# Patient Record
Sex: Female | Born: 1945 | Race: White | Hispanic: No | Marital: Married | Smoking: Never smoker
Health system: Southern US, Community
[De-identification: ages and names within clinical notes are randomized; demographics above are authoritative.]

## PROBLEM LIST (undated history)

## (undated) DIAGNOSIS — F32A Depression, unspecified: Secondary | ICD-10-CM

## (undated) DIAGNOSIS — F419 Anxiety disorder, unspecified: Secondary | ICD-10-CM

## (undated) DIAGNOSIS — R51 Headache: Secondary | ICD-10-CM

## (undated) DIAGNOSIS — M1990 Unspecified osteoarthritis, unspecified site: Secondary | ICD-10-CM

## (undated) DIAGNOSIS — R0600 Dyspnea, unspecified: Secondary | ICD-10-CM

## (undated) DIAGNOSIS — D649 Anemia, unspecified: Secondary | ICD-10-CM

## (undated) DIAGNOSIS — R519 Headache, unspecified: Secondary | ICD-10-CM

## (undated) DIAGNOSIS — K219 Gastro-esophageal reflux disease without esophagitis: Secondary | ICD-10-CM

## (undated) DIAGNOSIS — F329 Major depressive disorder, single episode, unspecified: Secondary | ICD-10-CM

## (undated) DIAGNOSIS — G4730 Sleep apnea, unspecified: Secondary | ICD-10-CM

## (undated) HISTORY — PX: BUNIONECTOMY: SHX129

## (undated) HISTORY — PX: FINGER SURGERY: SHX640

## (undated) HISTORY — DX: Gastro-esophageal reflux disease without esophagitis: K21.9

## (undated) HISTORY — DX: Headache: R51

## (undated) HISTORY — PX: OOPHORECTOMY: SHX86

## (undated) HISTORY — PX: JOINT REPLACEMENT: SHX530

## (undated) HISTORY — DX: Anemia, unspecified: D64.9

## (undated) HISTORY — DX: Depression, unspecified: F32.A

## (undated) HISTORY — DX: Major depressive disorder, single episode, unspecified: F32.9

## (undated) HISTORY — PX: ABDOMINAL HYSTERECTOMY: SHX81

## (undated) HISTORY — DX: Headache, unspecified: R51.9

## (undated) HISTORY — DX: Anxiety disorder, unspecified: F41.9

---

## 1998-06-06 ENCOUNTER — Ambulatory Visit (HOSPITAL_BASED_OUTPATIENT_CLINIC_OR_DEPARTMENT_OTHER): Admission: RE | Admit: 1998-06-06 | Discharge: 1998-06-06 | Payer: Self-pay | Admitting: Plastic Surgery

## 1999-08-20 ENCOUNTER — Encounter: Payer: Self-pay | Admitting: Internal Medicine

## 1999-08-20 ENCOUNTER — Encounter: Admission: RE | Admit: 1999-08-20 | Discharge: 1999-08-20 | Payer: Self-pay | Admitting: Internal Medicine

## 2000-11-10 ENCOUNTER — Encounter: Admission: RE | Admit: 2000-11-10 | Discharge: 2000-11-10 | Payer: Self-pay | Admitting: Internal Medicine

## 2000-11-10 ENCOUNTER — Encounter: Payer: Self-pay | Admitting: Internal Medicine

## 2000-12-22 ENCOUNTER — Encounter (INDEPENDENT_AMBULATORY_CARE_PROVIDER_SITE_OTHER): Payer: Self-pay | Admitting: Specialist

## 2000-12-22 ENCOUNTER — Ambulatory Visit (HOSPITAL_COMMUNITY): Admission: RE | Admit: 2000-12-22 | Discharge: 2000-12-22 | Payer: Self-pay | Admitting: *Deleted

## 2006-08-07 ENCOUNTER — Encounter: Admission: RE | Admit: 2006-08-07 | Discharge: 2006-08-07 | Payer: Self-pay | Admitting: Internal Medicine

## 2008-07-18 ENCOUNTER — Ambulatory Visit (HOSPITAL_COMMUNITY): Admission: RE | Admit: 2008-07-18 | Discharge: 2008-07-18 | Payer: Self-pay | Admitting: *Deleted

## 2008-07-18 ENCOUNTER — Encounter (INDEPENDENT_AMBULATORY_CARE_PROVIDER_SITE_OTHER): Payer: Self-pay | Admitting: *Deleted

## 2008-07-23 ENCOUNTER — Encounter: Admission: RE | Admit: 2008-07-23 | Discharge: 2008-07-23 | Payer: Self-pay | Admitting: Internal Medicine

## 2010-09-11 NOTE — Op Note (Signed)
Kendra Taylor, Kendra Taylor            ACCOUNT NO.:  [id number]   MEDICAL RECORD NO.:  [id number]          PATIENT TYPE:  AMB   LOCATION:  ENDO                         FACILITY:  [facility]   PHYSICIAN:  Georgiana Spinner, M.D.    DATE OF BIRTH:  19-Jun-1945   DATE OF PROCEDURE:  07/18/2008  DATE OF DISCHARGE:                               OPERATIVE REPORT   PROCEDURE:  Colonoscopy with biopsy.   INDICATIONS:  Colon polyps, colon cancer screening.   ANESTHESIA:  Fentanyl 100 mcg, Versed 10 mg.   PROCEDURE:  With the patient mildly sedated in the left lateral  decubitus position, the Pentax videoscopic pediatric colonoscope was  inserted in the rectum and passed under direct vision with pressure  applied to reach the cecum identified by ileocecal valve and appendiceal  orifice, both of which were photographed.  From this point the  colonoscope was slowly withdrawn taking circumferential views of colonic  mucosa stopping in the sigmoid colon where small polyp was seen and  removed using hot biopsy forceps technique setting 20/150 blended  current.  We next stopped in the rectum which appeared normal on direct  and showed hemorrhoids on retroflexed view.  The endoscope was  straightened and withdrawn.  The patient's vital signs, pulse oximeter  remained stable.  The patient tolerated procedure well without apparent  complication.   FINDINGS:  Small polyp of sigmoid colon.  Internal hemorrhoids,  otherwise unremarkable exam.   PLAN:  Await biopsy report.  The patient will call me for results and  follow-up with me as an outpatient.           ______________________________  Georgiana Spinner, M.D.     GMO/MEDQ  D:  07/18/2008  T:  07/18/2008  Job:  [id number]

## 2010-09-14 NOTE — Procedures (Signed)
[facility]  Patient:    Kendra Taylor, Kendra Taylor Visit Number: [id number] MRN: [id number]          Service Type: END Location: ENDO Attending Physician:  Sabino Gasser Proc. Date: 12/22/00 Adm. Date:  12/22/2000                             Procedure Report  PROCEDURE:  Colonoscopy with polypectomy.  INDICATIONS:  Colon polyp.  ANESTHESIA:  Demerol 50, Versed 5 mg.  DESCRIPTION OF PROCEDURE:  With the patient mildly sedated in the left lateral decubitus position, the Olympus videoscopic colonoscope was inserted in the rectum and passed under direct vision to the cecum, identified by the ileocecal valve and appendiceal orifice, both of which were photographed. From this point, the colonoscope was slowly withdrawn, taking circumferential views of the entire colonic mucosa, stopping only in the rectum which appeared normal on direct view and showed internal hemorrhoids on retroflex view. Above this, at 20 cm, was a polyp which also was photographed and removed using snare cautery technique setting of 3-3 blended current.  Once removed, the endoscope was withdrawn as noted above.  The patients vital signs and pulse oximeter remained stable.  The patient tolerated the procedure well without apparent complications.  FINDINGS: 1. Internal hemorrhoids. 2. Polyp at 20 cm.  PLAN:  Await biopsy report.  The patient will call me for results and follow up with me as an outpatient. Attending Physician:  Sabino Gasser DD:  12/22/00 TD:  12/22/00 Job: [id number]

## 2011-03-23 ENCOUNTER — Encounter: Payer: Self-pay | Admitting: Emergency Medicine

## 2011-03-23 ENCOUNTER — Emergency Department (INDEPENDENT_AMBULATORY_CARE_PROVIDER_SITE_OTHER)
Admission: EM | Admit: 2011-03-23 | Discharge: 2011-03-23 | Disposition: A | Discharge status: Home / Self Care | Payer: Medicare Other | Source: Home / Self Care | Attending: Family Medicine | Admitting: Family Medicine

## 2011-03-23 DIAGNOSIS — H109 Unspecified conjunctivitis: Secondary | ICD-10-CM

## 2011-03-23 HISTORY — DX: Unspecified osteoarthritis, unspecified site: M19.90

## 2011-03-23 HISTORY — DX: Sleep apnea, unspecified: G47.30

## 2011-03-23 MED ORDER — CIPROFLOXACIN HCL 0.3 % OP SOLN: 1.0000 [drp] | OPHTHALMIC | Status: AC

## 2011-03-23 NOTE — ED Notes (Signed)
Right eye pain onset Wednesday, Thursday.  Sore throat wed afternoon.  Denies fever.  Patient has a congested cough.  No ear pain

## 2011-03-23 NOTE — ED Provider Notes (Signed)
History     CSN: [id number] Arrival date & time: 03/23/2011 11:39 AM   First MD Initiated Contact with Patient 03/23/11 1034      Chief Complaint  Patient presents with  . URI    (Consider location/radiation/quality/duration/timing/severity/associated sxs/prior treatment) HPI Comments: Kendra Taylor presents with URI sx of nasal congestion, cough, watery eyes. She also now reports green discharge from her RIGHT eye with redness and "stuck shut" in the mornings. She wears glasses but not contacts.   Patient is a 65 y.o. female presenting with URI and conjunctivitis. The history is provided by the patient.  URI Primary symptoms do not include fever or fatigue. The current episode started more than 1 week ago. This is a new problem. The problem has not changed since onset. Symptoms associated with the illness include congestion.  Conjunctivitis  The current episode started 2 days ago. The problem occurs rarely. The problem has been gradually worsening. The problem is moderate. The symptoms are relieved by nothing. The symptoms are aggravated by nothing. Associated symptoms include eye itching, congestion, URI, eye discharge and eye redness. Pertinent negatives include no fever, no decreased vision, no double vision, no photophobia and no eye pain. There were no sick contacts. She has received no recent medical care.    No past medical history on file.  No past surgical history on file.  No family history on file.  History  Substance Use Topics  . Smoking status: Not on file  . Smokeless tobacco: Not on file  . Alcohol Use: Not on file    OB History    No data available      Review of Systems  Constitutional: Negative for fever and fatigue.  HENT: Positive for congestion.   Eyes: Positive for discharge, redness and itching. Negative for double vision, photophobia and pain.  Respiratory: Negative.   Cardiovascular: Negative.   Gastrointestinal: Negative.   Genitourinary:  Negative.   Musculoskeletal: Negative.   Skin: Negative.   Neurological: Negative.     Allergies  Aspirin  Home Medications   Current Outpatient Rx  Name Route Sig Dispense Refill  . CIPROFLOXACIN HCL 0.3 % OP SOLN Right Eye Place 1 drop into the right eye every 2 (two) hours. Administer 1 drop, every 2 hours, while awake, for 2 days. Then 1 drop, every 4 hours, while awake, for the next 5 days. 10 mL 0    BP 124/80  Pulse 74  Temp(Src) 98.5 F (36.9 C) (Oral)  Resp 18  SpO2 95%  Physical Exam  Constitutional: She is oriented to person, place, and time. She appears well-developed and well-nourished.  HENT:  Head: Normocephalic and atraumatic.  Right Ear: Tympanic membrane and ear canal normal.  Left Ear: Tympanic membrane and ear canal normal.  Mouth/Throat: Oropharynx is clear and moist.  Eyes: EOM are normal. Right eye exhibits discharge. Right conjunctiva is injected. Right conjunctiva has no hemorrhage.  Neck: Normal range of motion.  Pulmonary/Chest: Effort normal and breath sounds normal.  Neurological: She is alert and oriented to person, place, and time.  Skin: Skin is warm and dry.    ED Course  Procedures (including critical care time)  Labs Reviewed - No data to display No results found.   1. Conjunctivitis of right eye       MDM          Richardo Priest, MD 03/23/11 1253

## 2011-06-05 DIAGNOSIS — M159 Polyosteoarthritis, unspecified: Secondary | ICD-10-CM | POA: Diagnosis not present

## 2011-06-05 DIAGNOSIS — M069 Rheumatoid arthritis, unspecified: Secondary | ICD-10-CM | POA: Diagnosis not present

## 2011-06-18 DIAGNOSIS — M069 Rheumatoid arthritis, unspecified: Secondary | ICD-10-CM | POA: Diagnosis not present

## 2011-07-02 DIAGNOSIS — M069 Rheumatoid arthritis, unspecified: Secondary | ICD-10-CM | POA: Diagnosis not present

## 2011-07-30 DIAGNOSIS — M069 Rheumatoid arthritis, unspecified: Secondary | ICD-10-CM | POA: Diagnosis not present

## 2011-08-20 DIAGNOSIS — M159 Polyosteoarthritis, unspecified: Secondary | ICD-10-CM | POA: Diagnosis not present

## 2011-08-20 DIAGNOSIS — M069 Rheumatoid arthritis, unspecified: Secondary | ICD-10-CM | POA: Diagnosis not present

## 2011-08-26 DIAGNOSIS — K219 Gastro-esophageal reflux disease without esophagitis: Secondary | ICD-10-CM | POA: Diagnosis not present

## 2011-08-26 DIAGNOSIS — N393 Stress incontinence (female) (male): Secondary | ICD-10-CM | POA: Diagnosis not present

## 2011-09-24 DIAGNOSIS — R5383 Other fatigue: Secondary | ICD-10-CM | POA: Diagnosis not present

## 2011-09-24 DIAGNOSIS — R32 Unspecified urinary incontinence: Secondary | ICD-10-CM | POA: Diagnosis not present

## 2011-09-24 DIAGNOSIS — M069 Rheumatoid arthritis, unspecified: Secondary | ICD-10-CM | POA: Diagnosis not present

## 2011-09-24 DIAGNOSIS — R5381 Other malaise: Secondary | ICD-10-CM | POA: Diagnosis not present

## 2011-10-23 DIAGNOSIS — M6281 Muscle weakness (generalized): Secondary | ICD-10-CM | POA: Diagnosis not present

## 2011-10-23 DIAGNOSIS — R32 Unspecified urinary incontinence: Secondary | ICD-10-CM | POA: Diagnosis not present

## 2011-10-23 DIAGNOSIS — R279 Unspecified lack of coordination: Secondary | ICD-10-CM | POA: Diagnosis not present

## 2011-10-23 DIAGNOSIS — N393 Stress incontinence (female) (male): Secondary | ICD-10-CM | POA: Diagnosis not present

## 2011-11-01 DIAGNOSIS — N393 Stress incontinence (female) (male): Secondary | ICD-10-CM | POA: Diagnosis not present

## 2011-11-01 DIAGNOSIS — N3944 Nocturnal enuresis: Secondary | ICD-10-CM | POA: Diagnosis not present

## 2011-11-01 DIAGNOSIS — R351 Nocturia: Secondary | ICD-10-CM | POA: Diagnosis not present

## 2011-11-04 DIAGNOSIS — R1084 Generalized abdominal pain: Secondary | ICD-10-CM | POA: Diagnosis not present

## 2011-11-04 DIAGNOSIS — R110 Nausea: Secondary | ICD-10-CM | POA: Diagnosis not present

## 2011-11-05 DIAGNOSIS — R32 Unspecified urinary incontinence: Secondary | ICD-10-CM | POA: Diagnosis not present

## 2011-11-05 DIAGNOSIS — R279 Unspecified lack of coordination: Secondary | ICD-10-CM | POA: Diagnosis not present

## 2011-11-05 DIAGNOSIS — M6281 Muscle weakness (generalized): Secondary | ICD-10-CM | POA: Diagnosis not present

## 2011-11-05 DIAGNOSIS — N393 Stress incontinence (female) (male): Secondary | ICD-10-CM | POA: Diagnosis not present

## 2011-11-06 DIAGNOSIS — K219 Gastro-esophageal reflux disease without esophagitis: Secondary | ICD-10-CM | POA: Diagnosis not present

## 2011-11-06 DIAGNOSIS — K5900 Constipation, unspecified: Secondary | ICD-10-CM | POA: Diagnosis not present

## 2011-11-06 DIAGNOSIS — R110 Nausea: Secondary | ICD-10-CM | POA: Diagnosis not present

## 2011-11-12 DIAGNOSIS — N393 Stress incontinence (female) (male): Secondary | ICD-10-CM | POA: Diagnosis not present

## 2011-11-12 DIAGNOSIS — M6281 Muscle weakness (generalized): Secondary | ICD-10-CM | POA: Diagnosis not present

## 2011-11-12 DIAGNOSIS — R279 Unspecified lack of coordination: Secondary | ICD-10-CM | POA: Diagnosis not present

## 2011-11-12 DIAGNOSIS — R32 Unspecified urinary incontinence: Secondary | ICD-10-CM | POA: Diagnosis not present

## 2011-11-19 DIAGNOSIS — M159 Polyosteoarthritis, unspecified: Secondary | ICD-10-CM | POA: Diagnosis not present

## 2011-11-19 DIAGNOSIS — M069 Rheumatoid arthritis, unspecified: Secondary | ICD-10-CM | POA: Diagnosis not present

## 2011-11-19 DIAGNOSIS — M79609 Pain in unspecified limb: Secondary | ICD-10-CM | POA: Diagnosis not present

## 2011-11-26 DIAGNOSIS — R279 Unspecified lack of coordination: Secondary | ICD-10-CM | POA: Diagnosis not present

## 2011-11-26 DIAGNOSIS — F411 Generalized anxiety disorder: Secondary | ICD-10-CM | POA: Diagnosis not present

## 2011-11-26 DIAGNOSIS — Z7189 Other specified counseling: Secondary | ICD-10-CM | POA: Diagnosis not present

## 2011-11-26 DIAGNOSIS — R110 Nausea: Secondary | ICD-10-CM | POA: Diagnosis not present

## 2011-11-26 DIAGNOSIS — N393 Stress incontinence (female) (male): Secondary | ICD-10-CM | POA: Diagnosis not present

## 2011-11-26 DIAGNOSIS — M6281 Muscle weakness (generalized): Secondary | ICD-10-CM | POA: Diagnosis not present

## 2011-11-26 DIAGNOSIS — G43909 Migraine, unspecified, not intractable, without status migrainosus: Secondary | ICD-10-CM | POA: Diagnosis not present

## 2011-11-27 DIAGNOSIS — M0500 Felty's syndrome, unspecified site: Secondary | ICD-10-CM | POA: Diagnosis not present

## 2011-11-27 DIAGNOSIS — M2040 Other hammer toe(s) (acquired), unspecified foot: Secondary | ICD-10-CM | POA: Diagnosis not present

## 2011-12-02 DIAGNOSIS — K219 Gastro-esophageal reflux disease without esophagitis: Secondary | ICD-10-CM | POA: Diagnosis not present

## 2011-12-26 DIAGNOSIS — M542 Cervicalgia: Secondary | ICD-10-CM | POA: Diagnosis not present

## 2011-12-26 DIAGNOSIS — R51 Headache: Secondary | ICD-10-CM | POA: Diagnosis not present

## 2011-12-26 DIAGNOSIS — M62838 Other muscle spasm: Secondary | ICD-10-CM | POA: Diagnosis not present

## 2011-12-26 DIAGNOSIS — G43019 Migraine without aura, intractable, without status migrainosus: Secondary | ICD-10-CM | POA: Diagnosis not present

## 2011-12-26 DIAGNOSIS — Z79899 Other long term (current) drug therapy: Secondary | ICD-10-CM | POA: Diagnosis not present

## 2012-01-14 DIAGNOSIS — M069 Rheumatoid arthritis, unspecified: Secondary | ICD-10-CM | POA: Diagnosis not present

## 2012-01-15 DIAGNOSIS — R279 Unspecified lack of coordination: Secondary | ICD-10-CM | POA: Diagnosis not present

## 2012-01-15 DIAGNOSIS — N393 Stress incontinence (female) (male): Secondary | ICD-10-CM | POA: Diagnosis not present

## 2012-01-15 DIAGNOSIS — N3944 Nocturnal enuresis: Secondary | ICD-10-CM | POA: Diagnosis not present

## 2012-01-15 DIAGNOSIS — M6281 Muscle weakness (generalized): Secondary | ICD-10-CM | POA: Diagnosis not present

## 2012-01-24 DIAGNOSIS — H1044 Vernal conjunctivitis: Secondary | ICD-10-CM | POA: Diagnosis not present

## 2012-02-05 DIAGNOSIS — M6281 Muscle weakness (generalized): Secondary | ICD-10-CM | POA: Diagnosis not present

## 2012-02-05 DIAGNOSIS — N3944 Nocturnal enuresis: Secondary | ICD-10-CM | POA: Diagnosis not present

## 2012-02-05 DIAGNOSIS — R279 Unspecified lack of coordination: Secondary | ICD-10-CM | POA: Diagnosis not present

## 2012-02-05 DIAGNOSIS — N393 Stress incontinence (female) (male): Secondary | ICD-10-CM | POA: Diagnosis not present

## 2012-02-11 DIAGNOSIS — F411 Generalized anxiety disorder: Secondary | ICD-10-CM | POA: Diagnosis not present

## 2012-02-11 DIAGNOSIS — Z23 Encounter for immunization: Secondary | ICD-10-CM | POA: Diagnosis not present

## 2012-02-11 DIAGNOSIS — R32 Unspecified urinary incontinence: Secondary | ICD-10-CM | POA: Diagnosis not present

## 2012-02-11 DIAGNOSIS — R040 Epistaxis: Secondary | ICD-10-CM | POA: Diagnosis not present

## 2012-02-11 DIAGNOSIS — K219 Gastro-esophageal reflux disease without esophagitis: Secondary | ICD-10-CM | POA: Diagnosis not present

## 2012-02-25 DIAGNOSIS — J342 Deviated nasal septum: Secondary | ICD-10-CM | POA: Diagnosis not present

## 2012-02-25 DIAGNOSIS — K219 Gastro-esophageal reflux disease without esophagitis: Secondary | ICD-10-CM | POA: Diagnosis not present

## 2012-02-25 DIAGNOSIS — R040 Epistaxis: Secondary | ICD-10-CM | POA: Diagnosis not present

## 2012-02-25 DIAGNOSIS — G4733 Obstructive sleep apnea (adult) (pediatric): Secondary | ICD-10-CM | POA: Diagnosis not present

## 2012-03-06 DIAGNOSIS — H40059 Ocular hypertension, unspecified eye: Secondary | ICD-10-CM | POA: Diagnosis not present

## 2012-03-10 DIAGNOSIS — M069 Rheumatoid arthritis, unspecified: Secondary | ICD-10-CM | POA: Diagnosis not present

## 2012-03-16 DIAGNOSIS — G43019 Migraine without aura, intractable, without status migrainosus: Secondary | ICD-10-CM | POA: Diagnosis not present

## 2012-03-23 DIAGNOSIS — M159 Polyosteoarthritis, unspecified: Secondary | ICD-10-CM | POA: Diagnosis not present

## 2012-03-23 DIAGNOSIS — M069 Rheumatoid arthritis, unspecified: Secondary | ICD-10-CM | POA: Diagnosis not present

## 2012-03-24 DIAGNOSIS — K219 Gastro-esophageal reflux disease without esophagitis: Secondary | ICD-10-CM | POA: Diagnosis not present

## 2012-03-24 DIAGNOSIS — M949 Disorder of cartilage, unspecified: Secondary | ICD-10-CM | POA: Diagnosis not present

## 2012-03-24 DIAGNOSIS — Z79899 Other long term (current) drug therapy: Secondary | ICD-10-CM | POA: Diagnosis not present

## 2012-03-24 DIAGNOSIS — M899 Disorder of bone, unspecified: Secondary | ICD-10-CM | POA: Diagnosis not present

## 2012-03-31 DIAGNOSIS — M899 Disorder of bone, unspecified: Secondary | ICD-10-CM | POA: Diagnosis not present

## 2012-03-31 DIAGNOSIS — M069 Rheumatoid arthritis, unspecified: Secondary | ICD-10-CM | POA: Diagnosis not present

## 2012-03-31 DIAGNOSIS — N393 Stress incontinence (female) (male): Secondary | ICD-10-CM | POA: Diagnosis not present

## 2012-03-31 DIAGNOSIS — Z1212 Encounter for screening for malignant neoplasm of rectum: Secondary | ICD-10-CM | POA: Diagnosis not present

## 2012-03-31 DIAGNOSIS — M949 Disorder of cartilage, unspecified: Secondary | ICD-10-CM | POA: Diagnosis not present

## 2012-03-31 DIAGNOSIS — Z79899 Other long term (current) drug therapy: Secondary | ICD-10-CM | POA: Diagnosis not present

## 2012-03-31 DIAGNOSIS — K219 Gastro-esophageal reflux disease without esophagitis: Secondary | ICD-10-CM | POA: Diagnosis not present

## 2012-05-05 DIAGNOSIS — M069 Rheumatoid arthritis, unspecified: Secondary | ICD-10-CM | POA: Diagnosis not present

## 2012-06-16 DIAGNOSIS — L219 Seborrheic dermatitis, unspecified: Secondary | ICD-10-CM | POA: Diagnosis not present

## 2012-06-16 DIAGNOSIS — D235 Other benign neoplasm of skin of trunk: Secondary | ICD-10-CM | POA: Diagnosis not present

## 2012-06-16 DIAGNOSIS — L821 Other seborrheic keratosis: Secondary | ICD-10-CM | POA: Diagnosis not present

## 2012-06-24 DIAGNOSIS — L03039 Cellulitis of unspecified toe: Secondary | ICD-10-CM | POA: Diagnosis not present

## 2012-06-29 DIAGNOSIS — M65839 Other synovitis and tenosynovitis, unspecified forearm: Secondary | ICD-10-CM | POA: Diagnosis not present

## 2012-06-29 DIAGNOSIS — M19039 Primary osteoarthritis, unspecified wrist: Secondary | ICD-10-CM | POA: Diagnosis not present

## 2012-06-29 DIAGNOSIS — M25539 Pain in unspecified wrist: Secondary | ICD-10-CM | POA: Diagnosis not present

## 2012-06-29 DIAGNOSIS — M19049 Primary osteoarthritis, unspecified hand: Secondary | ICD-10-CM | POA: Diagnosis not present

## 2012-06-29 DIAGNOSIS — M79609 Pain in unspecified limb: Secondary | ICD-10-CM | POA: Diagnosis not present

## 2012-06-30 DIAGNOSIS — M069 Rheumatoid arthritis, unspecified: Secondary | ICD-10-CM | POA: Diagnosis not present

## 2012-07-07 ENCOUNTER — Other Ambulatory Visit: Payer: Self-pay | Admitting: Orthopedic Surgery

## 2012-07-15 ENCOUNTER — Encounter (HOSPITAL_COMMUNITY): Payer: Self-pay

## 2012-07-21 DIAGNOSIS — M779 Enthesopathy, unspecified: Secondary | ICD-10-CM | POA: Diagnosis not present

## 2012-07-21 DIAGNOSIS — M2040 Other hammer toe(s) (acquired), unspecified foot: Secondary | ICD-10-CM | POA: Diagnosis not present

## 2012-07-21 DIAGNOSIS — M0500 Felty's syndrome, unspecified site: Secondary | ICD-10-CM | POA: Diagnosis not present

## 2012-07-21 DIAGNOSIS — M069 Rheumatoid arthritis, unspecified: Secondary | ICD-10-CM | POA: Diagnosis not present

## 2012-07-21 DIAGNOSIS — R110 Nausea: Secondary | ICD-10-CM | POA: Diagnosis not present

## 2012-07-21 DIAGNOSIS — M159 Polyosteoarthritis, unspecified: Secondary | ICD-10-CM | POA: Diagnosis not present

## 2012-07-21 NOTE — Pre-Procedure Instructions (Signed)
Kendra Taylor  07/21/2012   Your procedure is scheduled on:  Thursday, April 3rd  Report to [facility] at 1200 PM.  Call this number if you have problems the morning of surgery: [phone]   Remember:   Do not eat food or drink liquids after midnight.    Take these medicines the morning of surgery with A SIP OF WATER: Protonix, zofran if needed, restasis eye drops   Do not wear jewelry, make-up or nail polish.  Do not wear lotions, powders, or perfumes,deodorant.  Do not shave 48 hours prior to surgery.   Do not bring valuables to the hospital.  Contacts, dentures or bridgework may not be worn into surgery.  Leave suitcase in the car. After surgery it may be brought to your room.  For patients admitted to the hospital, checkout time is 11:00 AM the day of discharge.   Patients discharged the day of surgery will not be allowed to drive home.  Special Instructions: Shower using CHG 2 nights before surgery and the night before surgery.  If you shower the day of surgery use CHG.  Use special wash - you have one bottle of CHG for all showers.  You should use approximately 1/3 of the bottle for each shower.   Please read over the following fact sheets that you were given: Pain Booklet, Coughing and Deep Breathing, MRSA Information and Surgical Site Infection Prevention

## 2012-07-22 ENCOUNTER — Encounter (HOSPITAL_COMMUNITY): Payer: Self-pay

## 2012-07-22 ENCOUNTER — Encounter (HOSPITAL_COMMUNITY)
Admission: RE | Admit: 2012-07-22 | Discharge: 2012-07-22 | Disposition: A | Discharge status: Home / Self Care | Payer: MEDICARE | Source: Ambulatory Visit | Attending: Orthopedic Surgery | Admitting: Orthopedic Surgery

## 2012-07-22 DIAGNOSIS — M069 Rheumatoid arthritis, unspecified: Secondary | ICD-10-CM | POA: Diagnosis not present

## 2012-07-22 DIAGNOSIS — Z01812 Encounter for preprocedural laboratory examination: Secondary | ICD-10-CM | POA: Diagnosis not present

## 2012-07-22 LAB — CBC
HCT: 42.4 % (ref 36.0–46.0)
Hemoglobin: 14 g/dL (ref 12.0–15.0)
MCH: 27.6 pg (ref 26.0–34.0)
MCHC: 33 g/dL (ref 30.0–36.0)
MCV: 83.6 fL (ref 78.0–100.0)
Platelets: 218 10*3/uL (ref 150–400)
RBC: 5.07 MIL/uL (ref 3.87–5.11)
RDW: 13.9 % (ref 11.5–15.5)
WBC: 6.4 10*3/uL (ref 4.0–10.5)

## 2012-07-22 LAB — BASIC METABOLIC PANEL
BUN: 23 mg/dL (ref 6–23)
CO2: 32 mEq/L (ref 19–32)
Calcium: 9.8 mg/dL (ref 8.4–10.5)
Chloride: 103 mEq/L (ref 96–112)
Creatinine, Ser: 0.81 mg/dL (ref 0.50–1.10)
GFR calc Af Amer: 84 mL/min — ABNORMAL LOW (ref >90)
GFR calc non Af Amer: 72 mL/min — ABNORMAL LOW (ref >90)
Glucose, Bld: 72 mg/dL (ref 70–99)
Potassium: 4.5 mEq/L (ref 3.5–5.1)
Sodium: 142 mEq/L (ref 135–145)

## 2012-07-22 LAB — SURGICAL PCR SCREEN
MRSA, PCR: NEGATIVE
Staphylococcus aureus: NEGATIVE

## 2012-07-27 DIAGNOSIS — M19049 Primary osteoarthritis, unspecified hand: Secondary | ICD-10-CM | POA: Diagnosis not present

## 2012-07-29 MED ORDER — CEFAZOLIN SODIUM-DEXTROSE 2-3 GM-% IV SOLR
2.0000 g | INTRAVENOUS | Status: AC
  Administered 2012-07-30: 2 g via INTRAVENOUS
  Filled 2012-07-29: qty 50

## 2012-07-30 ENCOUNTER — Observation Stay (HOSPITAL_COMMUNITY)
Admission: RE | Admit: 2012-07-30 | Discharge: 2012-08-01 | Disposition: A | Discharge status: Home / Self Care | Payer: MEDICARE | Source: Ambulatory Visit | Attending: Orthopedic Surgery | Admitting: Orthopedic Surgery

## 2012-07-30 ENCOUNTER — Encounter (HOSPITAL_COMMUNITY): Payer: Self-pay | Admitting: Anesthesiology

## 2012-07-30 ENCOUNTER — Inpatient Hospital Stay (HOSPITAL_COMMUNITY): Payer: MEDICARE | Admitting: Anesthesiology

## 2012-07-30 ENCOUNTER — Encounter (HOSPITAL_COMMUNITY)
Admission: RE | Disposition: A | Discharge status: Home / Self Care | Payer: Self-pay | Source: Ambulatory Visit | Attending: Orthopedic Surgery

## 2012-07-30 DIAGNOSIS — M069 Rheumatoid arthritis, unspecified: Principal | ICD-10-CM | POA: Insufficient documentation

## 2012-07-30 DIAGNOSIS — G8918 Other acute postprocedural pain: Secondary | ICD-10-CM | POA: Diagnosis not present

## 2012-07-30 DIAGNOSIS — R229 Localized swelling, mass and lump, unspecified: Secondary | ICD-10-CM | POA: Diagnosis not present

## 2012-07-30 DIAGNOSIS — Z01812 Encounter for preprocedural laboratory examination: Secondary | ICD-10-CM | POA: Insufficient documentation

## 2012-07-30 DIAGNOSIS — M129 Arthropathy, unspecified: Secondary | ICD-10-CM | POA: Diagnosis not present

## 2012-07-30 DIAGNOSIS — M05741 Rheumatoid arthritis with rheumatoid factor of right hand without organ or systems involvement: Secondary | ICD-10-CM

## 2012-07-30 DIAGNOSIS — M12549 Traumatic arthropathy, unspecified hand: Secondary | ICD-10-CM | POA: Diagnosis not present

## 2012-07-30 HISTORY — PX: FINGER ARTHROPLASTY: SHX5017

## 2012-07-30 SURGERY — ARTHROPLASTY, FINGER
Anesthesia: General | Site: Hand | Laterality: Right | Wound class: Clean

## 2012-07-30 MED ORDER — PROMETHAZINE HCL 25 MG/ML IJ SOLN
6.2500 mg | INTRAMUSCULAR | Status: DC | PRN
  Administered 2012-07-30: 6.25 mg via INTRAVENOUS

## 2012-07-30 MED ORDER — VITAMIN B-12 1000 MCG PO TABS
1000.0000 ug | ORAL_TABLET | Freq: Every day | ORAL | Status: DC
  Administered 2012-07-30 – 2012-08-01 (×3): 1000 ug via ORAL
  Filled 2012-07-30 (×3): qty 1

## 2012-07-30 MED ORDER — ZOLPIDEM TARTRATE 5 MG PO TABS: 10.0000 mg | ORAL_TABLET | Freq: Every evening | ORAL | Status: DC | PRN

## 2012-07-30 MED ORDER — SODIUM CHLORIDE 0.9 % IR SOLN
Status: DC | PRN
  Administered 2012-07-30: 15:00:00

## 2012-07-30 MED ORDER — PHENYLEPHRINE HCL 10 MG/ML IJ SOLN
10.0000 mg | INTRAVENOUS | Status: DC | PRN
  Administered 2012-07-30: 25 ug/min via INTRAVENOUS

## 2012-07-30 MED ORDER — VITAMIN D3 25 MCG (1000 UNIT) PO TABS
1000.0000 [IU] | ORAL_TABLET | Freq: Every day | ORAL | Status: DC
  Administered 2012-07-30 – 2012-08-01 (×3): 1000 [IU] via ORAL
  Filled 2012-07-30 (×3): qty 1

## 2012-07-30 MED ORDER — ONDANSETRON HCL 4 MG/2ML IJ SOLN
4.0000 mg | Freq: Four times a day (QID) | INTRAMUSCULAR | Status: DC | PRN
  Administered 2012-07-31: 4 mg via INTRAVENOUS
  Filled 2012-07-30: qty 2

## 2012-07-30 MED ORDER — LIDOCAINE HCL (CARDIAC) 20 MG/ML IV SOLN
INTRAVENOUS | Status: DC | PRN
  Administered 2012-07-30: 20 mg via INTRAVENOUS

## 2012-07-30 MED ORDER — CYCLOSPORINE 0.05 % OP EMUL
1.0000 [drp] | Freq: Two times a day (BID) | OPHTHALMIC | Status: DC
  Administered 2012-07-30 – 2012-08-01 (×3): 1 [drp] via OPHTHALMIC
  Filled 2012-07-30 (×5): qty 1

## 2012-07-30 MED ORDER — PANTOPRAZOLE SODIUM 40 MG PO TBEC
40.0000 mg | DELAYED_RELEASE_TABLET | ORAL | Status: DC
  Administered 2012-08-01: 40 mg via ORAL
  Filled 2012-07-30: qty 1

## 2012-07-30 MED ORDER — PROMETHAZINE HCL 25 MG RE SUPP: 12.5000 mg | Freq: Four times a day (QID) | RECTAL | Status: DC | PRN

## 2012-07-30 MED ORDER — DOCUSATE SODIUM 100 MG PO CAPS
100.0000 mg | ORAL_CAPSULE | Freq: Two times a day (BID) | ORAL | Status: DC
  Administered 2012-07-30 – 2012-08-01 (×4): 100 mg via ORAL
  Filled 2012-07-30 (×5): qty 1

## 2012-07-30 MED ORDER — ARTIFICIAL TEARS OP OINT
TOPICAL_OINTMENT | OPHTHALMIC | Status: DC | PRN
  Administered 2012-07-30: 1 via OPHTHALMIC

## 2012-07-30 MED ORDER — MORPHINE SULFATE 2 MG/ML IJ SOLN
1.0000 mg | INTRAMUSCULAR | Status: DC | PRN
  Administered 2012-07-31 (×2): 1 mg via INTRAVENOUS
  Filled 2012-07-30 (×2): qty 1

## 2012-07-30 MED ORDER — OXYCODONE HCL 5 MG/5ML PO SOLN: 5.0000 mg | Freq: Once | ORAL | Status: DC | PRN

## 2012-07-30 MED ORDER — CHLORHEXIDINE GLUCONATE 4 % EX LIQD: 60.0000 mL | Freq: Once | CUTANEOUS | Status: DC

## 2012-07-30 MED ORDER — ZOLPIDEM TARTRATE 5 MG PO TABS: 5.0000 mg | ORAL_TABLET | Freq: Every evening | ORAL | Status: DC | PRN

## 2012-07-30 MED ORDER — PROMETHAZINE HCL 25 MG/ML IJ SOLN
INTRAMUSCULAR | Status: AC
  Filled 2012-07-30: qty 1

## 2012-07-30 MED ORDER — ONDANSETRON HCL 4 MG/2ML IJ SOLN
INTRAMUSCULAR | Status: DC | PRN
  Administered 2012-07-30: 4 mg via INTRAVENOUS

## 2012-07-30 MED ORDER — MEPERIDINE HCL 25 MG/ML IJ SOLN: 6.2500 mg | INTRAMUSCULAR | Status: DC | PRN

## 2012-07-30 MED ORDER — BUPIVACAINE-EPINEPHRINE PF 0.5-1:200000 % IJ SOLN
INTRAMUSCULAR | Status: DC | PRN
  Administered 2012-07-30: 30 mL

## 2012-07-30 MED ORDER — 0.9 % SODIUM CHLORIDE (POUR BTL) OPTIME
TOPICAL | Status: DC | PRN
  Administered 2012-07-30: 1000 mL

## 2012-07-30 MED ORDER — LACTATED RINGERS IV SOLN
INTRAVENOUS | Status: DC
  Administered 2012-07-30 – 2012-07-31 (×2): via INTRAVENOUS

## 2012-07-30 MED ORDER — ONDANSETRON HCL 4 MG PO TABS: 4.0000 mg | ORAL_TABLET | Freq: Four times a day (QID) | ORAL | Status: DC | PRN

## 2012-07-30 MED ORDER — VITAMIN C 500 MG PO TABS
1000.0000 mg | ORAL_TABLET | Freq: Every day | ORAL | Status: DC
  Administered 2012-07-30 – 2012-08-01 (×3): 1000 mg via ORAL
  Filled 2012-07-30 (×3): qty 2

## 2012-07-30 MED ORDER — ALPRAZOLAM 0.5 MG PO TABS
0.5000 mg | ORAL_TABLET | Freq: Four times a day (QID) | ORAL | Status: DC | PRN
  Administered 2012-07-31 – 2012-08-01 (×2): 0.5 mg via ORAL
  Filled 2012-07-30 (×2): qty 1

## 2012-07-30 MED ORDER — LACTATED RINGERS IV SOLN
INTRAVENOUS | Status: DC | PRN
  Administered 2012-07-30 (×2): via INTRAVENOUS

## 2012-07-30 MED ORDER — FOLIC ACID 1 MG PO TABS
1.0000 mg | ORAL_TABLET | Freq: Every day | ORAL | Status: DC
  Administered 2012-07-30 – 2012-08-01 (×3): 1 mg via ORAL
  Filled 2012-07-30 (×3): qty 1

## 2012-07-30 MED ORDER — CEFAZOLIN SODIUM 1-5 GM-% IV SOLN
1.0000 g | Freq: Three times a day (TID) | INTRAVENOUS | Status: DC
  Administered 2012-07-30 – 2012-08-01 (×5): 1 g via INTRAVENOUS
  Filled 2012-07-30 (×7): qty 50

## 2012-07-30 MED ORDER — OXYCODONE HCL 5 MG PO TABS: 5.0000 mg | ORAL_TABLET | Freq: Once | ORAL | Status: DC | PRN

## 2012-07-30 MED ORDER — OXYCODONE HCL 5 MG PO TABS
5.0000 mg | ORAL_TABLET | ORAL | Status: DC | PRN
  Administered 2012-07-31 – 2012-08-01 (×7): 10 mg via ORAL
  Filled 2012-07-30 (×7): qty 2

## 2012-07-30 MED ORDER — FENTANYL CITRATE 0.05 MG/ML IJ SOLN
INTRAMUSCULAR | Status: DC | PRN
  Administered 2012-07-30 (×4): 25 ug via INTRAVENOUS

## 2012-07-30 MED ORDER — MIDAZOLAM HCL 2 MG/2ML IJ SOLN: 0.5000 mg | Freq: Once | INTRAMUSCULAR | Status: DC | PRN

## 2012-07-30 MED ORDER — HYDROMORPHONE HCL PF 1 MG/ML IJ SOLN: 0.2500 mg | INTRAMUSCULAR | Status: DC | PRN

## 2012-07-30 MED ORDER — MIDAZOLAM HCL 5 MG/5ML IJ SOLN
INTRAMUSCULAR | Status: DC | PRN
  Administered 2012-07-30: 2 mg via INTRAVENOUS

## 2012-07-30 MED ORDER — ACETAMINOPHEN 10 MG/ML IV SOLN
INTRAVENOUS | Status: AC
  Filled 2012-07-30: qty 100

## 2012-07-30 MED ORDER — SUMATRIPTAN SUCCINATE 100 MG PO TABS
100.0000 mg | ORAL_TABLET | ORAL | Status: DC | PRN
Start: 2012-07-30 — End: 2012-08-01
  Filled 2012-07-30: qty 1

## 2012-07-30 MED ORDER — ONDANSETRON 4 MG PO TBDP
4.0000 mg | ORAL_TABLET | Freq: Three times a day (TID) | ORAL | Status: DC | PRN
  Filled 2012-07-30: qty 1

## 2012-07-30 MED ORDER — LACTATED RINGERS IV SOLN
INTRAVENOUS | Status: DC
  Administered 2012-07-30: 22:00:00 via INTRAVENOUS

## 2012-07-30 MED ORDER — SODIUM CHLORIDE 0.45 % IV SOLN: INTRAVENOUS | Status: DC

## 2012-07-30 MED ORDER — PROPOFOL 10 MG/ML IV BOLUS
INTRAVENOUS | Status: DC | PRN
  Administered 2012-07-30: 200 mg via INTRAVENOUS

## 2012-07-30 SURGICAL SUPPLY — 59 items
BANDAGE CONFORM 3  STR LF (GAUZE/BANDAGES/DRESSINGS) ×2 IMPLANT
BANDAGE ELASTIC 3 VELCRO ST LF (GAUZE/BANDAGES/DRESSINGS) ×1 IMPLANT
BANDAGE ELASTIC 4 VELCRO ST LF (GAUZE/BANDAGES/DRESSINGS) ×1 IMPLANT
BANDAGE GAUZE ELAST BULKY 4 IN (GAUZE/BANDAGES/DRESSINGS) ×1 IMPLANT
BLADE SAW SGTL MED 73X18.5 STR (BLADE) ×1 IMPLANT
BNDG COHESIVE 1X5 TAN STRL LF (GAUZE/BANDAGES/DRESSINGS) IMPLANT
BUR MATCHSTICK NEURO 3.0 LAGG (BURR) ×1 IMPLANT
CLOTH BEACON ORANGE TIMEOUT ST (SAFETY) ×2 IMPLANT
CORDS BIPOLAR (ELECTRODE) ×2 IMPLANT
COVER SURGICAL LIGHT HANDLE (MISCELLANEOUS) ×2 IMPLANT
CUFF TOURNIQUET SINGLE 18IN (TOURNIQUET CUFF) ×2 IMPLANT
CUFF TOURNIQUET SINGLE 24IN (TOURNIQUET CUFF) IMPLANT
DRAPE OEC MINIVIEW 54X84 (DRAPES) ×1 IMPLANT
DRAPE SURG 17X23 STRL (DRAPES) ×2 IMPLANT
DRSG ADAPTIC 3X8 NADH LF (GAUZE/BANDAGES/DRESSINGS) ×1 IMPLANT
GAUZE SPONGE 2X2 8PLY STRL LF (GAUZE/BANDAGES/DRESSINGS) IMPLANT
GAUZE XEROFORM 1X8 LF (GAUZE/BANDAGES/DRESSINGS) IMPLANT
GAUZE XEROFORM 5X9 LF (GAUZE/BANDAGES/DRESSINGS) ×1 IMPLANT
GLOVE BIOGEL M STRL SZ7.5 (GLOVE) ×1 IMPLANT
GLOVE SS BIOGEL STRL SZ 8 (GLOVE) ×1 IMPLANT
GLOVE SUPERSENSE BIOGEL SZ 8 (GLOVE) ×1
GOWN PREVENTION PLUS XLARGE (GOWN DISPOSABLE) ×2 IMPLANT
GOWN STRL NON-REIN LRG LVL3 (GOWN DISPOSABLE) ×4 IMPLANT
GOWN STRL REIN XL XLG (GOWN DISPOSABLE) ×3 IMPLANT
IMPLANT FINGER (Trauma) ×1 IMPLANT
IMPLANT FINGER JONT SZ20 30DEG (Orthopedic Implant) ×1 IMPLANT
KIT BASIN OR (CUSTOM PROCEDURE TRAY) ×2 IMPLANT
KIT ROOM TURNOVER OR (KITS) ×2 IMPLANT
MANIFOLD NEPTUNE II (INSTRUMENTS) ×1 IMPLANT
NDL HYPO 25GX1X1/2 BEV (NEEDLE) IMPLANT
NEEDLE HYPO 25GX1X1/2 BEV (NEEDLE) IMPLANT
NEUFLEX MCP FINGER IMPLANT SIZE 10 CEMENTLESS ×2 IMPLANT
NEUFLEX MCP FINGER IMPLANT SIZE 20 CEMENTLESS ×1 IMPLANT
NS IRRIG 1000ML POUR BTL (IV SOLUTION) ×2 IMPLANT
PACK ORTHO EXTREMITY (CUSTOM PROCEDURE TRAY) ×2 IMPLANT
PAD ARMBOARD 7.5X6 YLW CONV (MISCELLANEOUS) ×4 IMPLANT
PAD CAST 4YDX4 CTTN HI CHSV (CAST SUPPLIES) IMPLANT
PADDING CAST COTTON 4X4 STRL (CAST SUPPLIES) ×4
PASSER SUT SWANSON 36MM LOOP (INSTRUMENTS) ×1 IMPLANT
RETRIEVER SUT HEWSON (MISCELLANEOUS) ×1 IMPLANT
SOLUTION BETADINE 4OZ (MISCELLANEOUS) ×2 IMPLANT
SPONGE GAUZE 2X2 STER 10/PKG (GAUZE/BANDAGES/DRESSINGS)
SPONGE GAUZE 4X4 12PLY (GAUZE/BANDAGES/DRESSINGS) ×2 IMPLANT
SPONGE SCRUB IODOPHOR (GAUZE/BANDAGES/DRESSINGS) ×2 IMPLANT
SUCTION FRAZIER TIP 10 FR DISP (SUCTIONS) ×2 IMPLANT
SUT FIBERWIRE 4-0 18 DIAM BLUE (SUTURE) ×4
SUT FIBERWIRE 4-0 18 TAPR NDL (SUTURE) ×8
SUT MERSILENE 4 0 P 3 (SUTURE) IMPLANT
SUT PROLENE 4 0 PS 2 18 (SUTURE) IMPLANT
SUT PROLENE 5 0 PS 2 (SUTURE) ×3 IMPLANT
SUTURE FIBERWR 4-0 18 DIA BLUE (SUTURE) IMPLANT
SUTURE FIBERWR 4-0 18 TAPR NDL (SUTURE) IMPLANT
SYR CONTROL 10ML LL (SYRINGE) IMPLANT
TOWEL OR 17X24 6PK STRL BLUE (TOWEL DISPOSABLE) ×2 IMPLANT
TOWEL OR 17X26 10 PK STRL BLUE (TOWEL DISPOSABLE) ×2 IMPLANT
TUBE CONNECTING 12X1/4 (SUCTIONS) ×1 IMPLANT
TUBE CONNECTING 20X1/4 (TUBING) ×1 IMPLANT
UNDERPAD 30X30 INCONTINENT (UNDERPADS AND DIAPERS) ×2 IMPLANT
WATER STERILE IRR 1000ML POUR (IV SOLUTION) ×1 IMPLANT

## 2012-07-30 NOTE — Preoperative (Signed)
Beta Blockers   Reason not to administer Beta Blockers:Not Applicable 

## 2012-07-30 NOTE — Anesthesia Preprocedure Evaluation (Addendum)
Anesthesia Evaluation  Patient identified by MRN, date of birth, ID band Patient awake    Reviewed: Allergy & Precautions, H&P , NPO status , Patient's Chart, lab work & pertinent test results  History of Anesthesia Complications Negative for: history of anesthetic complications  Airway Mallampati: II      Dental  (+) Dental Advisory Given and Teeth Intact   Pulmonary sleep apnea and Continuous Positive Airway Pressure Ventilation ,  breath sounds clear to auscultation  Pulmonary exam normal       Cardiovascular negative cardio ROS  Rhythm:Regular Rate:Normal     Neuro/Psych  Headaches,    GI/Hepatic Neg liver ROS, GERD-  Medicated and Controlled,  Endo/Other  negative endocrine ROS  Renal/GU negative Renal ROS     Musculoskeletal  (+) Arthritis -, Rheumatoid disorders,    Abdominal   Peds  Hematology negative hematology ROS (+)   Anesthesia Other Findings   Reproductive/Obstetrics                          Anesthesia Physical Anesthesia Plan  ASA: III  Anesthesia Plan: General   Post-op Pain Management:    Induction: Intravenous  Airway Management Planned: LMA  Additional Equipment:   Intra-op Plan:   Post-operative Plan:   Informed Consent: I have reviewed the patients History and Physical, chart, labs and discussed the procedure including the risks, benefits and alternatives for the proposed anesthesia with the patient or authorized representative who has indicated his/her understanding and acceptance.   Dental advisory given  Plan Discussed with: Surgeon and CRNA  Anesthesia Plan Comments: (Plan routine monitors, GA- LMA OK,  Supraclavicular block for post op analgesia by Dr. Michelle Piper)        Anesthesia Quick Evaluation

## 2012-07-30 NOTE — Anesthesia Postprocedure Evaluation (Signed)
Anesthesia Post Note  Patient: Kendra Taylor  Procedure(s) Performed: Procedure(s) (LRB): RIGHT INDEX FINGER, MIDDLE FINGER, RING FINGER, SMALL FINGER MCP ARTHROPLASTY WITH REALIGNMENT/REPAIR OF EXTENSOR TENDON (Right)  Anesthesia type: General  Patient location: PACU  Post pain: Pain level controlled and Adequate analgesia  Post assessment: Post-op Vital signs reviewed, Patient's Cardiovascular Status Stable, Respiratory Function Stable, Patent Airway and Pain level controlled  Last Vitals:  Filed Vitals:   07/30/12 1856  BP:   Pulse: 63  Temp:   Resp: 13    Post vital signs: Reviewed and stable  Level of consciousness: awake, alert  and oriented  Complications: No apparent anesthesia complications

## 2012-07-30 NOTE — Transfer of Care (Signed)
Immediate Anesthesia Transfer of Care Note  Patient: Kendra Taylor  Procedure(s) Performed: Procedure(s): RIGHT INDEX FINGER, MIDDLE FINGER, RING FINGER, SMALL FINGER MCP ARTHROPLASTY WITH REALIGNMENT/REPAIR OF EXTENSOR TENDON (Right)  Patient Location: PACU  Anesthesia Type:General  Level of Consciousness: awake, alert  and oriented  Airway & Oxygen Therapy: Patient connected to face mask oxygen  Post-op Assessment: Report given to PACU RN  Post vital signs: stable  Complications: No apparent anesthesia complications

## 2012-07-30 NOTE — Anesthesia Procedure Notes (Addendum)
Anesthesia Regional Block:  Supraclavicular block  Pre-Anesthetic Checklist: ,, timeout performed, Correct Patient, Correct Site, Correct Laterality, Correct Procedure, Correct Position, site marked, Risks and benefits discussed,  Surgical consent,  Pre-op evaluation,  At surgeon's request and post-op pain management  Laterality: Right  Prep: chloraprep       Needles:   Needle Type: Echogenic Stimulator Needle     Needle Length: 5cm 5 cm     Additional Needles:  Procedures: ultrasound guided (picture in chart) and nerve stimulator Supraclavicular block  Nerve Stimulator or Paresthesia:  Response: 0.4 mA,   Additional Responses:   Narrative:  Start time: 07/30/2012 2:30 PM End time: 07/30/2012 2:45 PM Injection made incrementally with aspirations every 5 mL.  Performed by: Personally  Anesthesiologist: Arta Bruce MD  Additional Notes: Monitors applied. Patient sedated. Sterile prep and drape,hand hygiene and sterile gloves were used. Relevant anatomy identified.Needle position confirmed.Local anesthetic injected incrementally after negative aspiration. Local anesthetic spread visualized around nerve(s). Vascular puncture avoided. No complications. Image printed for medical record.The patient tolerated the procedure well.       Supraclavicular block Procedure Name: LMA Insertion Date/Time: 07/30/2012 3:00 PM Performed by: Elizbeth Squires R Pre-anesthesia Checklist: Patient identified, Emergency Drugs available, Suction available and Patient being monitored Patient Re-evaluated:Patient Re-evaluated prior to inductionOxygen Delivery Method: Circle system utilized Preoxygenation: Pre-oxygenation with 100% oxygen Intubation Type: IV induction LMA: LMA inserted LMA Size: 4.0 Number of attempts: 1 Placement Confirmation: positive ETCO2 and breath sounds checked- equal and bilateral Tube secured with: Tape Dental Injury: Teeth and Oropharynx as per pre-operative assessment

## 2012-07-30 NOTE — H&P (Signed)
Kendra Taylor is an 67 y.o. female.   Chief Complaint: Rheumatoid arthritis with right hand deformity  HPI: Patient presents for right hand rheumatoid reconstruction and MCP Aplasty with realignment as necessary .Marland KitchenPatient presents for evaluation and treatment of the of their upper extremity predicament. The patient denies neck back chest or of abdominal pain. The patient notes that they have no lower extremity problems. The patient from primarily complains of the upper extremity pain noted. Past Medical History  Diagnosis Date  . Arthritis   . Migraine   . Sleep apnea     cpap    Past Surgical History  Procedure Laterality Date  . Abdominal hysterectomy    . Oophorectomy    . Bunionectomy    . Finger surgery      Family History  Problem Relation Age of Onset  . Dementia Mother   . Sleep apnea Mother   . Hyperthyroidism Mother   . Dementia Father   . Sleep apnea Father    Social History:  reports that she has never smoked. She does not have any smokeless tobacco history on file. She reports that she does not drink alcohol or use illicit drugs.  Allergies:  Allergies  Allergen Reactions  . Aspirin     Ringing in ears with small doses, large doses causes her to lose hearing    Medications Prior to Admission  Medication Sig Dispense Refill  . CALCIUM CARBONATE PO Take 1,000 mg by mouth daily.      . celecoxib (CELEBREX) 200 MG capsule Take 200 mg by mouth daily.        . cholecalciferol (VITAMIN D) 1000 UNITS tablet Take 1,000 Units by mouth daily.      . cycloSPORINE (RESTASIS) 0.05 % ophthalmic emulsion Place 1 drop into both eyes 2 (two) times daily.      . folic acid (FOLVITE) 1 MG tablet Take 1 mg by mouth daily.        Marland Kitchen inFLIXimab (REMICADE) 100 MG injection Inject into the vein every 8 (eight) weeks.       . magnesium gluconate (MAGONATE) 500 MG tablet Take 500 mg by mouth daily.      . methotrexate (RHEUMATREX) 2.5 MG tablet Take 15 mg by mouth every Friday.       . ondansetron (ZOFRAN-ODT) 4 MG disintegrating tablet Take 4 mg by mouth every 8 (eight) hours as needed for nausea.      . pantoprazole (PROTONIX) 40 MG tablet Take 40 mg by mouth every other day.      . SUMAtriptan (IMITREX) 100 MG tablet Take 100 mg by mouth every 2 (two) hours as needed for migraine.      . vitamin B-12 (CYANOCOBALAMIN) 1000 MCG tablet Take 1,000 mcg by mouth daily.      Marland Kitchen zolpidem (AMBIEN) 10 MG tablet Take 10 mg by mouth at bedtime as needed for sleep.        No results found for this or any previous visit (from the past 48 hour(s)). No results found.  Review of Systems  Constitutional: Negative.   HENT: Negative.   Eyes: Negative.   Respiratory: Negative.   Cardiovascular: Negative.   Gastrointestinal: Negative.   Genitourinary: Negative.   Skin: Negative.   Neurological: Negative.   Endo/Heme/Allergies: Negative.   Psychiatric/Behavioral: Negative.     Blood pressure 104/75, pulse 75, temperature 98.8 F (37.1 C), temperature source Tympanic, resp. rate 18, SpO2 98.00%. Physical Exam severe right MCP deformity with pain and  dysfunction Plan reconstruction and repair .Marland KitchenThe patient is alert and oriented in no acute distress the patient complains of pain in the affected upper extremity.  The patient is noted to have a normal HEENT exam.  Lung fields show equal chest expansion and no shortness of breath  abdomen exam is nontender without distention.  Lower extremity examination does not show any fracture dislocation or blood clot symptoms.  Pelvis is stable neck and back are stable and nontender Right index finger with mucous cyst-plan excision  Assessment/Plan .Marland KitchenWe are planning surgery for your upper extremity. The risk and benefits of surgery include risk of bleeding infection anesthesia damage to normal structures and failure of the surgery to accomplish its intended goals of relieving symptoms and restoring function with this in mind we'll going to  proceed. I have specifically discussed with the patient the pre-and postoperative regime and the does and don'ts and risk and benefits in great detail. Risk and benefits of surgery also include risk of dystrophy chronic nerve pain failure of the healing process to go onto completion and other inherent risks of surgery The relavent the pathophysiology of the disease/injury process, as well as the alternatives for treatment and postoperative course of action has been discussed in great detail with the patient who desires to proceed.  We will do everything in our power to help you (the patient) restore function to the upper extremity. Is a pleasure to see this patient today.  Karen Chafe 07/30/2012, 2:28 PM

## 2012-07-30 NOTE — Progress Notes (Signed)
Report to E. Compton RN as primary caregiver. 

## 2012-07-30 NOTE — Op Note (Signed)
See dictation #[id number] Dominica Severin MD

## 2012-07-31 ENCOUNTER — Encounter (HOSPITAL_COMMUNITY): Payer: Self-pay | Admitting: Orthopedic Surgery

## 2012-07-31 DIAGNOSIS — M069 Rheumatoid arthritis, unspecified: Secondary | ICD-10-CM | POA: Diagnosis not present

## 2012-07-31 DIAGNOSIS — Z01812 Encounter for preprocedural laboratory examination: Secondary | ICD-10-CM | POA: Diagnosis not present

## 2012-07-31 NOTE — Progress Notes (Signed)
UR COMPLETED  

## 2012-07-31 NOTE — Progress Notes (Signed)
Orthopedic Tech Progress Note Patient Details:  Kendra Taylor [birth year] [id number] Arm sling applied to Right UE. Tolerated well.  Ortho Devices Type of Ortho Device: Arm sling Ortho Device/Splint Interventions: Application   Asia R Thompson 07/31/2012, 2:00 PM

## 2012-07-31 NOTE — Evaluation (Signed)
Occupational Therapy Evaluation Patient Details Name: Kendra Taylor MRN: [id number] DOB: 05-Jul-1945 Today's Date: 07/31/2012 Time: 1130-1203 OT Time Calculation (min): 33 min  OT Assessment / Plan / Recommendation Clinical Impression   This 67 y.o. Female admitted for MCP arthroplasty digits II-V.  All instruction/education completed.  No further acute OT needs identified.     OT Assessment   (OP OT per MD orders)    Follow Up Recommendations  Outpatient OT (per MD recommendations)    Barriers to Discharge      Equipment Recommendations  None recommended by OT    Recommendations for Other Services    Frequency       Precautions / Restrictions Precautions Precautions: Other (comment) (Pt with Rt hand and wrist immobilized) Restrictions Weight Bearing Restrictions: Yes RUE Weight Bearing: Non weight bearing Other Position/Activity Restrictions: NWB through hand       ADL  Eating/Feeding: Set up Where Assessed - Eating/Feeding: Edge of bed Grooming: Wash/dry hands;Teeth care;Supervision/safety Where Assessed - Grooming: Unsupported standing Upper Body Bathing: Minimal assistance Where Assessed - Upper Body Bathing: Unsupported sitting Lower Body Bathing: Supervision/safety Where Assessed - Lower Body Bathing: Unsupported standing Upper Body Dressing: Modified independent Where Assessed - Upper Body Dressing: Unsupported sitting Lower Body Dressing: Set up;Supervision/safety Where Assessed - Lower Body Dressing: Unsupported sit to stand Toilet Transfer: Supervision/safety Toilet Transfer Method: Sit to Barista: Comfort height toilet;Regular height toilet Toileting - Clothing Manipulation and Hygiene: Supervision/safety Where Assessed - Toileting Clothing Manipulation and Hygiene: Standing Transfers/Ambulation Related to ADLs: requires supervision due to IV pole ADL Comments: Pt instructed in one handed dressing, to cover Rt. UE with bag, or  cast cover to shower.  Pt instructed in need for edema control - icing, elevation, and ROM of shoulder and elbow    OT Diagnosis:    OT Problem List:   OT Treatment Interventions:     OT Goals    Visit Information  Last OT Received On: 07/31/12 Assistance Needed: +1    Subjective Data  Subjective: "how do I shower?" Patient Stated Goal: To increase use of Rt. hand   Prior Functioning     Home Living Lives With: Significant other Available Help at Discharge: Family;Friend(s);Available 24 hours/day Prior Function Level of Independence: Independent Able to Take Stairs?: Yes Driving: Yes Communication Communication: No difficulties Dominant Hand: Left         Vision/Perception     Cognition  Cognition Overall Cognitive Status: Appears within functional limits for tasks assessed/performed Arousal/Alertness: Awake/alert Orientation Level: Appears intact for tasks assessed Behavior During Session: [facility] for tasks performed    Extremity/Trunk Assessment Right Upper Extremity Assessment RUE ROM/Strength/Tone: Deficits;Due to precautions Left Upper Extremity Assessment LUE ROM/Strength/Tone: Deficits LUE ROM/Strength/Tone Deficits: Pt with significant arthritic deformity Lt hand Trunk Assessment Trunk Assessment: Normal     Mobility Bed Mobility Bed Mobility: Supine to Sit;Sitting - Scoot to Edge of Bed Supine to Sit: 6: Modified independent (Device/Increase time);HOB flat Sitting - Scoot to Edge of Bed: 6: Modified independent (Device/Increase time) Transfers Transfers: Sit to Stand;Stand to Sit Sit to Stand: 5: Supervision;From bed;From chair/3-in-1 Stand to Sit: 6: Modified independent (Device/Increase time);With upper extremity assist;To bed;To chair/3-in-1 Details for Transfer Assistance: Pt requires supervision due to IV     Exercise     Balance     End of Session OT - End of Session Activity Tolerance: Patient tolerated treatment well Patient left:  in bed;with call bell/phone within reach  GO Functional Limitation:  Self care Self Care Current Status [phone]): At least 1 percent but less than 20 percent impaired, limited or restricted Self Care Discharge Status [phone]): At least 1 percent but less than 20 percent impaired, limited or restricted    [employer], OTR/L [phone] Jeani Hawking M 07/31/2012, 3:09 PM

## 2012-07-31 NOTE — Progress Notes (Signed)
Subjective: 1 Day Post-Op Procedure(s) (LRB): RIGHT INDEX FINGER, MIDDLE FINGER, RING FINGER, SMALL FINGER MCP ARTHROPLASTY WITH REALIGNMENT/REPAIR OF EXTENSOR TENDON (Right) Patient reports pain as mild Objective: Vital signs in last 24 hours: Temp:  [97.2 F (36.2 C)-99.8 F (37.7 C)] 99.8 F (37.7 C) (04/04 1456) Pulse Rate:  [64-96] 96 (04/04 1456) Resp:  [16-18] 18 (04/04 1456) BP: (102-122)/(47-62) 122/51 mmHg (04/04 1456) SpO2:  [97 %-99 %] 99 % (04/04 1456) Weight:  [58.06 kg (128 lb)] 58.06 kg (128 lb) (04/04 0144)  Intake/Output from previous day: 04/03 0701 - 04/04 0700 In: 2580 [P.O.:480; I.V.:2100] Out: 400 [Urine:400] Intake/Output this shift:    No results found for this basename: HGB,  in the last 72 hours No results found for this basename: WBC, RBC, HCT, PLT,  in the last 72 hours No results found for this basename: NA, K, CL, CO2, BUN, CREATININE, GLUCOSE, CALCIUM,  in the last 72 hours No results found for this basename: LABPT, INR,  in the last 72 hours  .Marland KitchenThe patient is alert and oriented in no acute distress the patient complains of pain in the affected upper extremity.  The patient is noted to have a normal HEENT exam.  Lung fields show equal chest expansion and no shortness of breath  abdomen exam is nontender without distention.  Lower extremity examination does not show any fracture dislocation or blood clot symptoms.  Pelvis is stable neck and back are stable and nontender   Assessment/Plan: 1 Day Post-Op Procedure(s) (LRB): RIGHT INDEX FINGER, MIDDLE FINGER, RING FINGER, SMALL FINGER MCP ARTHROPLASTY WITH REALIGNMENT/REPAIR OF EXTENSOR TENDON (Right) Continue pain control Dc tomorrow Looks good at this point  Karen Chafe 07/31/2012, 7:14 PM

## 2012-07-31 NOTE — Op Note (Signed)
NAMEIREENE, BALLOWE NO.:  [id number]  MEDICAL RECORD NO.:  [id number]  LOCATION:  [id number]                        FACILITY:  [facility]  PHYSICIAN:  Dionne Ano. Micki Cassel, M.D.DATE OF BIRTH:  17-Oct-1945  DATE OF PROCEDURE: DATE OF DISCHARGE:                              OPERATIVE REPORT   PREOPERATIVE DIAGNOSIS:  Severe rheumatoid arthritis with advanced deformity and loss of function, right hand and upper extremity.  POSTOPERATIVE DIAGNOSIS:  Severe rheumatoid arthritis with advanced deformity and loss of function, right hand and upper extremity.  PROCEDURE: 1. Right index finger MCP arthroplasty, size 10 implant, NeuFlex DePuy     implant. 2. Right middle finger MCP arthroplasty with a size 20, DePuy NeuFlex     implant. 3. Arthroplasty, right ring finger with a DePuy size 10 NeuFlex MCP     arthroplasty. 4. Arthroplasty size 0 MCP NeuFlex implant about small finger MCP     joint, right small finger. 5. Extensor realignment procedure and radial collateral ligament     reconstruction with bony drill hole technique, right index finger. 6. Right index finger, distal interphalangeal joint mass removal. 7. Right middle finger extensor realignment procedure. 8. Right ring finger extensor realignment procedure. 9. Right small finger extensor realignment procedure. 10.Intrinsic release, right ring finger. 11.Intrinsic release, right small finger including the     hypothenar/abductor digiti minimi musculotendinous junction. 12.Stress radiography. 13.Volar plate release index, middle, ring, and small finger joints     about the right hand. 14.Right small finger radial collateral ligament reconstruction. 15.Right index finger radial collateral ligament reconstruction.  SURGEON:  Dionne Ano. Amanda Pea, M.D.  ASSISTANT:  None.  COMPLICATIONS:  None.  ANESTHESIA:  General with preoperative block.  TOURNIQUET TIME:  2 hours and 7 minutes.  DRAINS:  2 small vessel loop  drains were placed.  ESTIMATED BLOOD LOSS:  Minimal.  INDICATIONS:  The patient is a pleasant female who presents with the above-mentioned diagnosis and plan to proceed with surgical intervention as outlined.  I have counseled her in regards to risks and benefits of surgery including risk of infection, bleeding, anesthesia, damage to normal structures, and failure of surgery to accomplish its intended goals of relieving symptoms and restoring function.  With this in mind, she desires to proceed.  All questions have been encouraged and answered preoperatively.  OPERATIVE PROCEDURE:  The patient was seen by myself and anesthesia, and underwent a smooth induction of general anesthetic.  Preoperatively, she was counseled extensively and preoperative block was placed.  Following this, she was prepped and draped in usual sterile fashion, Betadine scrub and paint.  Outline marks were made.  Tourniquet was insufflated to 250 mmHg.  Transverse incision was made across the MCP joints.  Skin was carefully elevated in the valleys.  I took care to preserve the venous architecture and I did identify the terminal branch of the superficial radial nerve and dorsal sensory branch of the ulnar nerve. These underwent limited neurolysis and were swept out of Harm's way. Following this, I then performed incision about the index finger.  I released the interval between the EIP and the [facility].  Dissection was carried down.  The capsule was opened.  I saved the capsule  for later imbrication and collateral ligament reconstruction.  I released the ulnar collateral ligament as usual and then reefed and preset a FiberWire for the radial collateral ligament.  Following this, I then performed bony resection of the metacarpal head and bony resection of the cartilage about the proximal phalanx.  I then entered the medullary canals with an awl and then sized to a size 10.  A size 10 had a good fit.  Temporary/trial  prosthesis was placed and I was pleased with this. Following this, patient then underwent a very careful and cautious approach to the middle finger.  Similar procedure was performed.  Ulnar collateral ligament was released.  Following this, the radial collateral ligament was very gently identified, kept intact.  Following this, metacarpal head and proximal phalanx, proximal portion were resected.  I was able to elevate the proximal phalanx above the metacarpal head region after volar plate release.  I performed a volar plate release about the middle finger and the index finger.  I demonstrated excellent position of a trial prosthesis size 20 of the knee flexed DePuy implants.  Following this, I then performed a similar procedure about the ring finger.  An incision was made ulnarly to the [facility] along the longitudinal axis.  Tendon was swept out of Harm's way.  Joint was entered.  Ulnar collateral ligament was released in its entirety.  I released the intrinsics as well as there was tremendous pull force.  Following this, I performed the resection of course about the bony architecture.  I was able to achieve excellent sizing to a size 10 NeuFlex implant.  I was pleased with the findings.  Following this, I performed a similar procedure about the small finger. Once again, the ulnar collateral ligament was released.  Radial collateral ligament was preserved as best as possible and bony resection about the metacarpal head and proximal phalanx was accomplished.  I sized to a size 0.  This was the smallest caliber bony diameter.  I should note that the ring finger and small finger also underwent volar plate releases.  Thus, in all fingers, ulnar collateral ligaments were released.  The volar plate was released and mobilized.  The patient had the radial collateral ligaments preserved as much as possible and on the small finger and the index finger.  I placed a FiberWire stitch to recreate the  ligament in its entirety.  Following this, I then irrigated copiously, checked the x-rays and all looked quite well.  At this time, I then plan it with no-touch technique the implants.  This was once again, a DePuy NeuFlex size 10 about the index, size 20 about the middle finger, size 10 about the ring finger, and size 0 about the small finger, no-touch technique and antibiotic irrigant was placed about all areas.  Following this, I then checked this under x-ray, all looked quite well.  Once this was done, I then performed extensor realignment about the index finger followed by middle finger, followed by ring finger, followed by small finger, extensor realignment with imbricating technique was accomplished.  I did not suture the sagittal band ulnarly as this would create deforming force which the patient has been burdened with for so long.  I did perform radial collateral ligament reconstruction with FiberWire technique and bony drill hole which was preset prior to implant placement about the small finger and the index finger.  The patient was irrigated copiously.  Hemostasis was achieved with pressure and this was done without difficulty after 127  minutes tourniquet time.  I deflated the tourniquet.  Excellent refill was noted.  The fingers looked great.  I took final copy x-rays.  Following this, I placed a small Penrose tourniquet about the PIP joint of the index finger and carefully removed from the DIP joint less than 1 cm deep mass which is consistent with her rheumatoid nodule.  This was sutured with 3 interrupted 5-0 Prolene.  Following this, vessel loop drain was placed about 2 areas of the transverse incision and a transverse incision was then closed after copious irrigation was applied.  All looked quite well.  The tracking looked good.  This plate looked good.  The patient was centrally aligned and I was quite pleased with this.  Hopefully, this will absolutely achieve  improvements in her functional characteristics and her quality life.  I have discussed these issues at length, these notes, etc.  Should any problems occur, she will notify me.  Otherwise, we are going to look forward to seeing her daily while in the hospital and of course see her back very frequently for followup.  In terms of her postop care, we will hold her still for 7-10 days.  We will get her into a dynamic splint during the daytime with therapy after 10-14 days, and at nighttime have a nighttime splint.  We absolutely need to make sure we have a force on the index finger and other fingers to keep them in radially deviated position while the healing occurs over the 1st 6-8 weeks.  These notes have been discussed.  All questions have been encouraged and answered.  It has been an absolute pleasure to see her today and was very pleased with the correction and hopefully should do very well in the future.  These notes discussed and all questions encouraged and answered.     Dionne Ano. Amanda Pea, M.D.     [facility]  D:  07/30/2012  T:  07/31/2012  Job:  [id number]

## 2012-08-01 DIAGNOSIS — M069 Rheumatoid arthritis, unspecified: Secondary | ICD-10-CM | POA: Diagnosis not present

## 2012-08-01 DIAGNOSIS — Z01812 Encounter for preprocedural laboratory examination: Secondary | ICD-10-CM | POA: Diagnosis not present

## 2012-08-01 NOTE — Progress Notes (Signed)
Patient ID: Kendra Taylor, female   DOB: Oct 25, 1945, 67 y.o.   MRN: [id number] Patient looks well and is ready for discharge please see discharge summary and discharge exam  [name].D.

## 2012-08-01 NOTE — Discharge Summary (Signed)
Physician Discharge Summary  Patient ID: Kendra Taylor MRN: [id number] DOB/AGE: [birth year] 67 y.o.  Admit date: 07/30/2012 Discharge date: 08/01/2012  Admission Diagnoses: Premature arthritis with severe hand deformity  Discharge Diagnoses: Status post rheumatoid hand reconstruction right upper extremity including MCP arthroplasties and extensor realignment Active Problems:   * No active hospital problems. *   Discharged Condition: good  Hospital Course: Admitted postop status post rheumatoid hand reconstruction. Please see op note. Patient did well she matriculation along a postop algorithm of antibiotics pain control and observatory measures. She did quite well at the time of discharge she was awake alert and oriented and had no complaints. She has excellent refill to the hand no complications and looks well we have scheduled therapy and followup for her. She is comfortable and pleased with her care.  Consults: None  Significant Diagnostic Studies: labs: See chart  Treatments: surgery: See op note  Discharge Exam: Blood pressure 117/63, pulse 85, temperature 99.4 F (37.4 C), temperature source Oral, resp. rate 18, height 5\' 3"  (1.6 m), weight 58.06 kg (128 lb), SpO2 98.00%. General appearance: alert and cooperative .Marland KitchenThe patient is alert and oriented in no acute distress the patient complains of pain in the affected upper extremity.  The patient is noted to have a normal HEENT exam.  Lung fields show equal chest expansion and no shortness of breath  abdomen exam is nontender without distention.  Lower extremity examination does not show any fracture dislocation or blood clot symptoms.  Pelvis is stable neck and back are stable and nontender  Disposition: 01-Home or Self Care     Medication List    ASK your doctor about these medications       CALCIUM CARBONATE PO  Take 1,000 mg by mouth daily.     celecoxib 200 MG capsule  Commonly known as:  CELEBREX  Take 200 mg  by mouth daily.     cholecalciferol 1000 UNITS tablet  Commonly known as:  VITAMIN D  Take 1,000 Units by mouth daily.     cycloSPORINE 0.05 % ophthalmic emulsion  Commonly known as:  RESTASIS  Place 1 drop into both eyes 2 (two) times daily.     folic acid 1 MG tablet  Commonly known as:  FOLVITE  Take 1 mg by mouth daily.     magnesium gluconate 500 MG tablet  Commonly known as:  MAGONATE  Take 500 mg by mouth daily.     methotrexate 2.5 MG tablet  Commonly known as:  RHEUMATREX  Take 15 mg by mouth every Friday.     ondansetron 4 MG disintegrating tablet  Commonly known as:  ZOFRAN-ODT  Take 4 mg by mouth every 8 (eight) hours as needed for nausea.     pantoprazole 40 MG tablet  Commonly known as:  PROTONIX  Take 40 mg by mouth every other day.     REMICADE 100 MG injection  Generic drug:  inFLIXimab  Inject into the vein every 8 (eight) weeks.     SUMAtriptan 100 MG tablet  Commonly known as:  IMITREX  Take 100 mg by mouth every 2 (two) hours as needed for migraine.     vitamin B-12 1000 MCG tablet  Commonly known as:  CYANOCOBALAMIN  Take 1,000 mcg by mouth daily.     zolpidem 10 MG tablet  Commonly known as:  AMBIEN  Take 10 mg by mouth at bedtime as needed for sleep.  Follow-up Information   Follow up with Karen Chafe, MD. (Please come to the office Monday, April 14 at 8 AM to see Dr. Amanda Pea. Therapy will see you at 9 AM.)    Contact information:   [address] [place] [address] [phone]       Signed: Karen Chafe 08/01/2012, 11:49 AM

## 2012-08-05 ENCOUNTER — Encounter (HOSPITAL_COMMUNITY): Payer: Self-pay | Admitting: Orthopedic Surgery

## 2012-08-10 DIAGNOSIS — Z4789 Encounter for other orthopedic aftercare: Secondary | ICD-10-CM | POA: Diagnosis not present

## 2012-08-12 DIAGNOSIS — Z4789 Encounter for other orthopedic aftercare: Secondary | ICD-10-CM | POA: Diagnosis not present

## 2012-08-12 DIAGNOSIS — M12549 Traumatic arthropathy, unspecified hand: Secondary | ICD-10-CM | POA: Diagnosis not present

## 2012-08-13 DIAGNOSIS — M12549 Traumatic arthropathy, unspecified hand: Secondary | ICD-10-CM | POA: Diagnosis not present

## 2012-08-17 DIAGNOSIS — M12549 Traumatic arthropathy, unspecified hand: Secondary | ICD-10-CM | POA: Diagnosis not present

## 2012-08-19 DIAGNOSIS — Z4789 Encounter for other orthopedic aftercare: Secondary | ICD-10-CM | POA: Diagnosis not present

## 2012-08-19 DIAGNOSIS — M19039 Primary osteoarthritis, unspecified wrist: Secondary | ICD-10-CM | POA: Diagnosis not present

## 2012-08-21 DIAGNOSIS — M19039 Primary osteoarthritis, unspecified wrist: Secondary | ICD-10-CM | POA: Diagnosis not present

## 2012-08-24 DIAGNOSIS — M19039 Primary osteoarthritis, unspecified wrist: Secondary | ICD-10-CM | POA: Diagnosis not present

## 2012-08-25 DIAGNOSIS — M069 Rheumatoid arthritis, unspecified: Secondary | ICD-10-CM | POA: Diagnosis not present

## 2012-08-26 DIAGNOSIS — Z4789 Encounter for other orthopedic aftercare: Secondary | ICD-10-CM | POA: Diagnosis not present

## 2012-08-26 DIAGNOSIS — M19039 Primary osteoarthritis, unspecified wrist: Secondary | ICD-10-CM | POA: Diagnosis not present

## 2012-08-28 DIAGNOSIS — M19039 Primary osteoarthritis, unspecified wrist: Secondary | ICD-10-CM | POA: Diagnosis not present

## 2012-08-31 DIAGNOSIS — M19039 Primary osteoarthritis, unspecified wrist: Secondary | ICD-10-CM | POA: Diagnosis not present

## 2012-08-31 DIAGNOSIS — Z4789 Encounter for other orthopedic aftercare: Secondary | ICD-10-CM | POA: Diagnosis not present

## 2012-09-02 DIAGNOSIS — M19039 Primary osteoarthritis, unspecified wrist: Secondary | ICD-10-CM | POA: Diagnosis not present

## 2012-09-04 DIAGNOSIS — M19039 Primary osteoarthritis, unspecified wrist: Secondary | ICD-10-CM | POA: Diagnosis not present

## 2012-09-07 DIAGNOSIS — M19039 Primary osteoarthritis, unspecified wrist: Secondary | ICD-10-CM | POA: Diagnosis not present

## 2012-09-09 DIAGNOSIS — M19039 Primary osteoarthritis, unspecified wrist: Secondary | ICD-10-CM | POA: Diagnosis not present

## 2012-09-11 DIAGNOSIS — M19039 Primary osteoarthritis, unspecified wrist: Secondary | ICD-10-CM | POA: Diagnosis not present

## 2012-09-14 DIAGNOSIS — M19049 Primary osteoarthritis, unspecified hand: Secondary | ICD-10-CM | POA: Diagnosis not present

## 2012-09-14 DIAGNOSIS — Z4789 Encounter for other orthopedic aftercare: Secondary | ICD-10-CM | POA: Diagnosis not present

## 2012-09-16 DIAGNOSIS — M069 Rheumatoid arthritis, unspecified: Secondary | ICD-10-CM | POA: Diagnosis not present

## 2012-09-16 NOTE — Progress Notes (Signed)
Occupational Therapy Addendum  For eval 08-14-2012.   08/14/2012 1500  OT G-codes **NOT FOR INPATIENT CLASS**  Self Care Goal Status (Z6109) CI   Jeani Hawking, OTR/L [phone]

## 2012-09-18 DIAGNOSIS — M069 Rheumatoid arthritis, unspecified: Secondary | ICD-10-CM | POA: Diagnosis not present

## 2012-09-23 DIAGNOSIS — M069 Rheumatoid arthritis, unspecified: Secondary | ICD-10-CM | POA: Diagnosis not present

## 2012-09-25 DIAGNOSIS — M069 Rheumatoid arthritis, unspecified: Secondary | ICD-10-CM | POA: Diagnosis not present

## 2012-09-28 DIAGNOSIS — M069 Rheumatoid arthritis, unspecified: Secondary | ICD-10-CM | POA: Diagnosis not present

## 2012-09-30 DIAGNOSIS — M069 Rheumatoid arthritis, unspecified: Secondary | ICD-10-CM | POA: Diagnosis not present

## 2012-09-30 DIAGNOSIS — Z9889 Other specified postprocedural states: Secondary | ICD-10-CM | POA: Diagnosis not present

## 2012-10-02 DIAGNOSIS — M069 Rheumatoid arthritis, unspecified: Secondary | ICD-10-CM | POA: Diagnosis not present

## 2012-10-06 ENCOUNTER — Other Ambulatory Visit (HOSPITAL_COMMUNITY): Payer: Self-pay | Admitting: Internal Medicine

## 2012-10-06 DIAGNOSIS — Z1231 Encounter for screening mammogram for malignant neoplasm of breast: Secondary | ICD-10-CM

## 2012-10-07 ENCOUNTER — Ambulatory Visit (HOSPITAL_COMMUNITY)
Admission: RE | Admit: 2012-10-07 | Discharge: 2012-10-07 | Disposition: A | Discharge status: Home / Self Care | Payer: MEDICARE | Source: Ambulatory Visit | Attending: Internal Medicine | Admitting: Internal Medicine

## 2012-10-07 DIAGNOSIS — Z1231 Encounter for screening mammogram for malignant neoplasm of breast: Secondary | ICD-10-CM | POA: Diagnosis not present

## 2012-10-08 DIAGNOSIS — M069 Rheumatoid arthritis, unspecified: Secondary | ICD-10-CM | POA: Diagnosis not present

## 2012-10-09 DIAGNOSIS — M069 Rheumatoid arthritis, unspecified: Secondary | ICD-10-CM | POA: Diagnosis not present

## 2012-10-12 DIAGNOSIS — M069 Rheumatoid arthritis, unspecified: Secondary | ICD-10-CM | POA: Diagnosis not present

## 2012-10-19 DIAGNOSIS — M069 Rheumatoid arthritis, unspecified: Secondary | ICD-10-CM | POA: Diagnosis not present

## 2012-10-21 DIAGNOSIS — M069 Rheumatoid arthritis, unspecified: Secondary | ICD-10-CM | POA: Diagnosis not present

## 2012-10-22 DIAGNOSIS — M069 Rheumatoid arthritis, unspecified: Secondary | ICD-10-CM | POA: Diagnosis not present

## 2012-10-23 DIAGNOSIS — M069 Rheumatoid arthritis, unspecified: Secondary | ICD-10-CM | POA: Diagnosis not present

## 2012-10-26 DIAGNOSIS — M069 Rheumatoid arthritis, unspecified: Secondary | ICD-10-CM | POA: Diagnosis not present

## 2012-10-28 DIAGNOSIS — M069 Rheumatoid arthritis, unspecified: Secondary | ICD-10-CM | POA: Diagnosis not present

## 2012-11-02 DIAGNOSIS — M069 Rheumatoid arthritis, unspecified: Secondary | ICD-10-CM | POA: Diagnosis not present

## 2012-11-04 DIAGNOSIS — M069 Rheumatoid arthritis, unspecified: Secondary | ICD-10-CM | POA: Diagnosis not present

## 2012-11-06 DIAGNOSIS — M069 Rheumatoid arthritis, unspecified: Secondary | ICD-10-CM | POA: Diagnosis not present

## 2012-11-09 DIAGNOSIS — M069 Rheumatoid arthritis, unspecified: Secondary | ICD-10-CM | POA: Diagnosis not present

## 2012-11-11 DIAGNOSIS — M069 Rheumatoid arthritis, unspecified: Secondary | ICD-10-CM | POA: Diagnosis not present

## 2012-11-13 DIAGNOSIS — M069 Rheumatoid arthritis, unspecified: Secondary | ICD-10-CM | POA: Diagnosis not present

## 2012-11-16 DIAGNOSIS — M069 Rheumatoid arthritis, unspecified: Secondary | ICD-10-CM | POA: Diagnosis not present

## 2012-11-18 DIAGNOSIS — M069 Rheumatoid arthritis, unspecified: Secondary | ICD-10-CM | POA: Diagnosis not present

## 2012-11-20 DIAGNOSIS — M069 Rheumatoid arthritis, unspecified: Secondary | ICD-10-CM | POA: Diagnosis not present

## 2012-11-23 DIAGNOSIS — M069 Rheumatoid arthritis, unspecified: Secondary | ICD-10-CM | POA: Diagnosis not present

## 2012-11-24 DIAGNOSIS — M159 Polyosteoarthritis, unspecified: Secondary | ICD-10-CM | POA: Diagnosis not present

## 2012-11-24 DIAGNOSIS — B009 Herpesviral infection, unspecified: Secondary | ICD-10-CM | POA: Diagnosis not present

## 2012-11-24 DIAGNOSIS — M069 Rheumatoid arthritis, unspecified: Secondary | ICD-10-CM | POA: Diagnosis not present

## 2012-12-09 DIAGNOSIS — M069 Rheumatoid arthritis, unspecified: Secondary | ICD-10-CM | POA: Diagnosis not present

## 2012-12-11 DIAGNOSIS — M069 Rheumatoid arthritis, unspecified: Secondary | ICD-10-CM | POA: Diagnosis not present

## 2012-12-15 DIAGNOSIS — M069 Rheumatoid arthritis, unspecified: Secondary | ICD-10-CM | POA: Diagnosis not present

## 2012-12-16 DIAGNOSIS — M069 Rheumatoid arthritis, unspecified: Secondary | ICD-10-CM | POA: Diagnosis not present

## 2012-12-18 DIAGNOSIS — M069 Rheumatoid arthritis, unspecified: Secondary | ICD-10-CM | POA: Diagnosis not present

## 2012-12-21 DIAGNOSIS — M069 Rheumatoid arthritis, unspecified: Secondary | ICD-10-CM | POA: Diagnosis not present

## 2012-12-23 DIAGNOSIS — M069 Rheumatoid arthritis, unspecified: Secondary | ICD-10-CM | POA: Diagnosis not present

## 2012-12-25 DIAGNOSIS — M069 Rheumatoid arthritis, unspecified: Secondary | ICD-10-CM | POA: Diagnosis not present

## 2012-12-29 DIAGNOSIS — M069 Rheumatoid arthritis, unspecified: Secondary | ICD-10-CM | POA: Diagnosis not present

## 2012-12-30 DIAGNOSIS — M069 Rheumatoid arthritis, unspecified: Secondary | ICD-10-CM | POA: Diagnosis not present

## 2013-01-01 DIAGNOSIS — M069 Rheumatoid arthritis, unspecified: Secondary | ICD-10-CM | POA: Diagnosis not present

## 2013-01-04 DIAGNOSIS — M069 Rheumatoid arthritis, unspecified: Secondary | ICD-10-CM | POA: Diagnosis not present

## 2013-01-04 DIAGNOSIS — M19049 Primary osteoarthritis, unspecified hand: Secondary | ICD-10-CM | POA: Diagnosis not present

## 2013-01-06 DIAGNOSIS — M069 Rheumatoid arthritis, unspecified: Secondary | ICD-10-CM | POA: Diagnosis not present

## 2013-01-08 ENCOUNTER — Other Ambulatory Visit: Payer: Self-pay | Admitting: Physician Assistant

## 2013-01-08 DIAGNOSIS — M069 Rheumatoid arthritis, unspecified: Secondary | ICD-10-CM | POA: Diagnosis not present

## 2013-01-08 DIAGNOSIS — B029 Zoster without complications: Secondary | ICD-10-CM | POA: Diagnosis not present

## 2013-01-08 DIAGNOSIS — D485 Neoplasm of uncertain behavior of skin: Secondary | ICD-10-CM | POA: Diagnosis not present

## 2013-01-08 DIAGNOSIS — D229 Melanocytic nevi, unspecified: Secondary | ICD-10-CM

## 2013-01-08 HISTORY — DX: Melanocytic nevi, unspecified: D22.9

## 2013-01-11 DIAGNOSIS — M069 Rheumatoid arthritis, unspecified: Secondary | ICD-10-CM | POA: Diagnosis not present

## 2013-01-12 DIAGNOSIS — M069 Rheumatoid arthritis, unspecified: Secondary | ICD-10-CM | POA: Diagnosis not present

## 2013-01-15 DIAGNOSIS — M069 Rheumatoid arthritis, unspecified: Secondary | ICD-10-CM | POA: Diagnosis not present

## 2013-01-22 DIAGNOSIS — M069 Rheumatoid arthritis, unspecified: Secondary | ICD-10-CM | POA: Diagnosis not present

## 2013-01-26 DIAGNOSIS — M069 Rheumatoid arthritis, unspecified: Secondary | ICD-10-CM | POA: Diagnosis not present

## 2013-01-29 DIAGNOSIS — M069 Rheumatoid arthritis, unspecified: Secondary | ICD-10-CM | POA: Diagnosis not present

## 2013-02-01 DIAGNOSIS — M069 Rheumatoid arthritis, unspecified: Secondary | ICD-10-CM | POA: Diagnosis not present

## 2013-02-04 DIAGNOSIS — M069 Rheumatoid arthritis, unspecified: Secondary | ICD-10-CM | POA: Diagnosis not present

## 2013-02-08 DIAGNOSIS — M069 Rheumatoid arthritis, unspecified: Secondary | ICD-10-CM | POA: Diagnosis not present

## 2013-02-09 DIAGNOSIS — M069 Rheumatoid arthritis, unspecified: Secondary | ICD-10-CM | POA: Diagnosis not present

## 2013-02-10 DIAGNOSIS — M069 Rheumatoid arthritis, unspecified: Secondary | ICD-10-CM | POA: Diagnosis not present

## 2013-02-10 DIAGNOSIS — M159 Polyosteoarthritis, unspecified: Secondary | ICD-10-CM | POA: Diagnosis not present

## 2013-02-11 DIAGNOSIS — M069 Rheumatoid arthritis, unspecified: Secondary | ICD-10-CM | POA: Diagnosis not present

## 2013-02-15 DIAGNOSIS — M069 Rheumatoid arthritis, unspecified: Secondary | ICD-10-CM | POA: Diagnosis not present

## 2013-02-19 DIAGNOSIS — M069 Rheumatoid arthritis, unspecified: Secondary | ICD-10-CM | POA: Diagnosis not present

## 2013-02-22 DIAGNOSIS — M069 Rheumatoid arthritis, unspecified: Secondary | ICD-10-CM | POA: Diagnosis not present

## 2013-02-25 DIAGNOSIS — M069 Rheumatoid arthritis, unspecified: Secondary | ICD-10-CM | POA: Diagnosis not present

## 2013-03-01 DIAGNOSIS — M069 Rheumatoid arthritis, unspecified: Secondary | ICD-10-CM | POA: Diagnosis not present

## 2013-03-04 DIAGNOSIS — M069 Rheumatoid arthritis, unspecified: Secondary | ICD-10-CM | POA: Diagnosis not present

## 2013-04-06 DIAGNOSIS — M069 Rheumatoid arthritis, unspecified: Secondary | ICD-10-CM | POA: Diagnosis not present

## 2013-04-09 DIAGNOSIS — M205X9 Other deformities of toe(s) (acquired), unspecified foot: Secondary | ICD-10-CM | POA: Diagnosis not present

## 2013-04-15 DIAGNOSIS — M159 Polyosteoarthritis, unspecified: Secondary | ICD-10-CM | POA: Diagnosis not present

## 2013-04-15 DIAGNOSIS — M069 Rheumatoid arthritis, unspecified: Secondary | ICD-10-CM | POA: Diagnosis not present

## 2013-04-15 DIAGNOSIS — Z23 Encounter for immunization: Secondary | ICD-10-CM | POA: Diagnosis not present

## 2013-05-25 DIAGNOSIS — H2510 Age-related nuclear cataract, unspecified eye: Secondary | ICD-10-CM | POA: Diagnosis not present

## 2013-06-01 DIAGNOSIS — M069 Rheumatoid arthritis, unspecified: Secondary | ICD-10-CM | POA: Diagnosis not present

## 2013-06-22 DIAGNOSIS — Y999 Unspecified external cause status: Secondary | ICD-10-CM | POA: Diagnosis not present

## 2013-06-22 DIAGNOSIS — S93129A Dislocation of metatarsophalangeal joint of unspecified toe(s), initial encounter: Secondary | ICD-10-CM | POA: Diagnosis not present

## 2013-06-22 DIAGNOSIS — M216X9 Other acquired deformities of unspecified foot: Secondary | ICD-10-CM | POA: Diagnosis not present

## 2013-06-22 DIAGNOSIS — G8918 Other acute postprocedural pain: Secondary | ICD-10-CM | POA: Diagnosis not present

## 2013-06-22 DIAGNOSIS — X58XXXA Exposure to other specified factors, initial encounter: Secondary | ICD-10-CM | POA: Diagnosis not present

## 2013-06-22 DIAGNOSIS — M2040 Other hammer toe(s) (acquired), unspecified foot: Secondary | ICD-10-CM | POA: Diagnosis not present

## 2013-06-22 DIAGNOSIS — Y929 Unspecified place or not applicable: Secondary | ICD-10-CM | POA: Diagnosis not present

## 2013-06-22 DIAGNOSIS — Y939 Activity, unspecified: Secondary | ICD-10-CM | POA: Diagnosis not present

## 2013-06-22 DIAGNOSIS — M205X9 Other deformities of toe(s) (acquired), unspecified foot: Secondary | ICD-10-CM | POA: Diagnosis not present

## 2013-07-27 DIAGNOSIS — M069 Rheumatoid arthritis, unspecified: Secondary | ICD-10-CM | POA: Diagnosis not present

## 2013-08-03 DIAGNOSIS — M2040 Other hammer toe(s) (acquired), unspecified foot: Secondary | ICD-10-CM | POA: Diagnosis not present

## 2013-08-03 DIAGNOSIS — Z4789 Encounter for other orthopedic aftercare: Secondary | ICD-10-CM | POA: Diagnosis not present

## 2013-08-12 DIAGNOSIS — M069 Rheumatoid arthritis, unspecified: Secondary | ICD-10-CM | POA: Diagnosis not present

## 2013-08-12 DIAGNOSIS — M159 Polyosteoarthritis, unspecified: Secondary | ICD-10-CM | POA: Diagnosis not present

## 2013-09-01 DIAGNOSIS — Z4789 Encounter for other orthopedic aftercare: Secondary | ICD-10-CM | POA: Diagnosis not present

## 2013-09-01 DIAGNOSIS — M2040 Other hammer toe(s) (acquired), unspecified foot: Secondary | ICD-10-CM | POA: Diagnosis not present

## 2013-09-21 DIAGNOSIS — M069 Rheumatoid arthritis, unspecified: Secondary | ICD-10-CM | POA: Diagnosis not present

## 2013-11-01 DIAGNOSIS — G43909 Migraine, unspecified, not intractable, without status migrainosus: Secondary | ICD-10-CM | POA: Diagnosis not present

## 2013-11-01 DIAGNOSIS — L255 Unspecified contact dermatitis due to plants, except food: Secondary | ICD-10-CM | POA: Diagnosis not present

## 2013-11-09 DIAGNOSIS — M069 Rheumatoid arthritis, unspecified: Secondary | ICD-10-CM | POA: Diagnosis not present

## 2013-12-13 DIAGNOSIS — M159 Polyosteoarthritis, unspecified: Secondary | ICD-10-CM | POA: Diagnosis not present

## 2013-12-13 DIAGNOSIS — M25579 Pain in unspecified ankle and joints of unspecified foot: Secondary | ICD-10-CM | POA: Diagnosis not present

## 2013-12-13 DIAGNOSIS — M069 Rheumatoid arthritis, unspecified: Secondary | ICD-10-CM | POA: Diagnosis not present

## 2013-12-21 DIAGNOSIS — M069 Rheumatoid arthritis, unspecified: Secondary | ICD-10-CM | POA: Diagnosis not present

## 2013-12-25 ENCOUNTER — Ambulatory Visit (INDEPENDENT_AMBULATORY_CARE_PROVIDER_SITE_OTHER): Payer: Medicare Other | Admitting: Internal Medicine

## 2013-12-25 VITALS — BP 104/62 | HR 71 | Temp 98.1°F | Resp 15 | Ht 62.5 in | Wt 129.8 lb

## 2013-12-25 DIAGNOSIS — R300 Dysuria: Secondary | ICD-10-CM | POA: Diagnosis not present

## 2013-12-25 DIAGNOSIS — M069 Rheumatoid arthritis, unspecified: Secondary | ICD-10-CM

## 2013-12-25 LAB — POCT URINALYSIS DIPSTICK
Bilirubin, UA: NEGATIVE
Blood, UA: NEGATIVE
Glucose, UA: NEGATIVE
Ketones, UA: NEGATIVE
Nitrite, UA: NEGATIVE
Protein, UA: NEGATIVE
Spec Grav, UA: 1.01
Urobilinogen, UA: 0.2
pH, UA: 7.5

## 2013-12-25 LAB — POCT UA - MICROSCOPIC ONLY
Casts, Ur, LPF, POC: NEGATIVE
Crystals, Ur, HPF, POC: NEGATIVE
Mucus, UA: NEGATIVE
Yeast, UA: NEGATIVE

## 2013-12-25 MED ORDER — CIPROFLOXACIN HCL 250 MG PO TABS: 250.0000 mg | ORAL_TABLET | Freq: Two times a day (BID) | ORAL | Status: DC

## 2013-12-25 NOTE — Progress Notes (Signed)
Subjective:  This chart was scribed for Kendra Lin, MD by Donato Schultz, Medical Scribe. This patient was seen in Room 2 and the patient's care was started at 9:45 AM.   Patient ID: Kendra Taylor, female    DOB: 1945-12-27, 68 y.o.   MRN: [id number]  HPI HPI Comments: Kendra Taylor is a 68 y.o. female who presents to the [facility] complaining of reoccurring vaginal pain that started a couple of days ago.  She experienced these symptoms a month ago and believes she has experienced these symptoms three times within the past year.  She denies dysuria, frequency, urgency, fever, vaginal discharge, vaginal itching, and nocturia as associated symptoms.  She states that during urination she will experience a spasm deep in her bladder that persists long after she urinates.  She also feels pressure in her bladder.  She feels like it takes a little bit of effort to start urinating.  She has never been treated with antibiotics and her symptoms will resolve on their own.  She has also been suffering from constipation but does not feel as though there is correlation between this and her current symptoms.  Corn relieves her constipation.  She has a history of oophorectomy and hysterectomy.     Past Medical History  Diagnosis Date  . Arthritis   . Migraine   . Sleep apnea     cpap  . Anemia   . Anxiety   . Depression   . GERD (gastroesophageal reflux disease)    Past Surgical History  Procedure Laterality Date  . Abdominal hysterectomy    . Oophorectomy    . Bunionectomy    . Finger surgery    . Finger arthroplasty Right 07/30/2012    Procedure: RIGHT INDEX FINGER, MIDDLE FINGER, RING FINGER, SMALL FINGER MCP ARTHROPLASTY WITH REALIGNMENT/REPAIR OF EXTENSOR TENDON;  Surgeon: Roseanne Kaufman, MD;  Location: [facility];  Service: Orthopedics;  Laterality: Right;  . Colon surgery    . Joint replacement     Family History  Problem Relation Age of Onset  . Dementia Mother    . Sleep apnea Mother   . Hyperthyroidism Mother   . Dementia Father   . Sleep apnea Father    History   Social History  . Marital Status: Single    Spouse Name: N/A    Number of Children: N/A  . Years of Education: N/A   Occupational History  . Not on file.   Social History Main Topics  . Smoking status: Never Smoker   . Smokeless tobacco: Not on file  . Alcohol Use: No  . Drug Use: No  . Sexual Activity: Not on file   Other Topics Concern  . Not on file   Social History Narrative  . No narrative on file   Allergies  Allergen Reactions  . Aspirin     Ringing in ears with small doses, large doses causes her to lose hearing    Review of Systems  Constitutional: Negative for fever.  Gastrointestinal: Positive for constipation.  Genitourinary: Negative for dysuria, urgency, frequency and vaginal discharge.     Objective:  Physical Exam  Nursing note and vitals reviewed. Constitutional: She is oriented to person, place, and time. She appears well-developed and well-nourished. No distress.  HENT:  Head: Normocephalic and atraumatic.  Eyes: Conjunctivae and EOM are normal. Pupils are equal, round, and reactive to light.  Neck: Neck supple.  Cardiovascular: Normal rate.   Pulmonary/Chest: Effort normal.  Neurological: She is alert and oriented to person, place, and time. No cranial nerve deficit.  Psychiatric: She has a normal mood and affect. Her behavior is normal.   Results for orders placed in visit on 12/25/13  POCT URINALYSIS DIPSTICK      Result Value Ref Range   Color, UA yellow     Clarity, UA clear     Glucose, UA neg     Bilirubin, UA neg     Ketones, UA neg     Spec Grav, UA 1.010     Blood, UA neg     pH, UA 7.5     Protein, UA neg     Urobilinogen, UA 0.2     Nitrite, UA neg     Leukocytes, UA small (1+)    POCT UA - MICROSCOPIC ONLY      Result Value Ref Range   WBC, Ur, HPF, POC 8-15     RBC, urine, microscopic 1-3     Bacteria, U  Microscopic 1+     Mucus, UA neg     Epithelial cells, urine per micros 1-2     Crystals, Ur, HPF, POC neg     Casts, Ur, LPF, POC neg     Yeast, UA neg      BP 104/62  Pulse 71  Temp(Src) 98.1 F (36.7 C) (Oral)  Resp 15  Ht 5' 2.5" (1.588 m)  Wt 129 lb 12.8 oz (58.877 kg)  BMI 23.35 kg/m2  SpO2 98% Assessment & Plan:  Dysuria - Plan: Urine culture, POCT urinalysis dipstick, POCT UA - Microscopic Only  Rheumatoid arthritis  Meds ordered this encounter  Medications         . ciprofloxacin (CIPRO) 250 MG tablet    Sig: Take 1 tablet (250 mg total) by mouth 2 (two) times daily.    Dispense:  20 tablet    Refill:  0   Culture  I personally performed the services described in this documentation, which was scribed in my presence. The recorded information has been reviewed and is accurate.

## 2013-12-26 LAB — URINE CULTURE: Colony Count: >=100000

## 2013-12-27 DIAGNOSIS — M069 Rheumatoid arthritis, unspecified: Secondary | ICD-10-CM | POA: Insufficient documentation

## 2013-12-29 ENCOUNTER — Encounter: Payer: Self-pay | Admitting: Internal Medicine

## 2014-01-17 DIAGNOSIS — M2040 Other hammer toe(s) (acquired), unspecified foot: Secondary | ICD-10-CM | POA: Diagnosis not present

## 2014-02-07 DIAGNOSIS — M0589 Other rheumatoid arthritis with rheumatoid factor of multiple sites: Secondary | ICD-10-CM | POA: Diagnosis not present

## 2014-02-07 DIAGNOSIS — Z23 Encounter for immunization: Secondary | ICD-10-CM | POA: Diagnosis not present

## 2014-02-07 DIAGNOSIS — M0579 Rheumatoid arthritis with rheumatoid factor of multiple sites without organ or systems involvement: Secondary | ICD-10-CM | POA: Diagnosis not present

## 2014-02-28 ENCOUNTER — Encounter (HOSPITAL_BASED_OUTPATIENT_CLINIC_OR_DEPARTMENT_OTHER): Payer: Self-pay | Admitting: *Deleted

## 2014-03-02 ENCOUNTER — Other Ambulatory Visit: Payer: Self-pay | Admitting: Physician Assistant

## 2014-03-03 ENCOUNTER — Encounter (HOSPITAL_BASED_OUTPATIENT_CLINIC_OR_DEPARTMENT_OTHER): Payer: Self-pay | Admitting: Certified Registered"

## 2014-03-03 ENCOUNTER — Encounter (HOSPITAL_BASED_OUTPATIENT_CLINIC_OR_DEPARTMENT_OTHER)
Admission: RE | Disposition: A | Discharge status: Home / Self Care | Payer: Self-pay | Source: Ambulatory Visit | Attending: Orthopedic Surgery

## 2014-03-03 ENCOUNTER — Ambulatory Visit (HOSPITAL_BASED_OUTPATIENT_CLINIC_OR_DEPARTMENT_OTHER): Payer: MEDICARE | Admitting: Certified Registered"

## 2014-03-03 ENCOUNTER — Ambulatory Visit (HOSPITAL_BASED_OUTPATIENT_CLINIC_OR_DEPARTMENT_OTHER)
Admission: RE | Admit: 2014-03-03 | Discharge: 2014-03-03 | Disposition: A | Discharge status: Home / Self Care | Payer: MEDICARE | Source: Ambulatory Visit | Attending: Orthopedic Surgery | Admitting: Orthopedic Surgery

## 2014-03-03 DIAGNOSIS — G4730 Sleep apnea, unspecified: Secondary | ICD-10-CM | POA: Diagnosis not present

## 2014-03-03 DIAGNOSIS — G43909 Migraine, unspecified, not intractable, without status migrainosus: Secondary | ICD-10-CM | POA: Insufficient documentation

## 2014-03-03 DIAGNOSIS — M205X2 Other deformities of toe(s) (acquired), left foot: Secondary | ICD-10-CM

## 2014-03-03 DIAGNOSIS — D649 Anemia, unspecified: Secondary | ICD-10-CM | POA: Insufficient documentation

## 2014-03-03 DIAGNOSIS — M1990 Unspecified osteoarthritis, unspecified site: Secondary | ICD-10-CM | POA: Insufficient documentation

## 2014-03-03 DIAGNOSIS — K219 Gastro-esophageal reflux disease without esophagitis: Secondary | ICD-10-CM | POA: Diagnosis not present

## 2014-03-03 DIAGNOSIS — M205X9 Other deformities of toe(s) (acquired), unspecified foot: Secondary | ICD-10-CM | POA: Diagnosis not present

## 2014-03-03 DIAGNOSIS — F418 Other specified anxiety disorders: Secondary | ICD-10-CM | POA: Insufficient documentation

## 2014-03-03 DIAGNOSIS — M2042 Other hammer toe(s) (acquired), left foot: Secondary | ICD-10-CM | POA: Diagnosis not present

## 2014-03-03 DIAGNOSIS — G8918 Other acute postprocedural pain: Secondary | ICD-10-CM | POA: Diagnosis not present

## 2014-03-03 DIAGNOSIS — M79672 Pain in left foot: Secondary | ICD-10-CM | POA: Diagnosis not present

## 2014-03-03 HISTORY — PX: HAMMER TOE SURGERY: SHX385

## 2014-03-03 HISTORY — PX: METATARSAL HEAD EXCISION: SHX5027

## 2014-03-03 LAB — POCT HEMOGLOBIN-HEMACUE: Hemoglobin: 14.2 g/dL (ref 12.0–15.0)

## 2014-03-03 SURGERY — EXCISION, METATARSAL BONE, HEAD
Anesthesia: General | Site: Foot | Laterality: Left

## 2014-03-03 MED ORDER — CHLORHEXIDINE GLUCONATE 4 % EX LIQD: 60.0000 mL | Freq: Once | CUTANEOUS | Status: DC

## 2014-03-03 MED ORDER — PROPOFOL 10 MG/ML IV BOLUS
INTRAVENOUS | Status: DC | PRN
  Administered 2014-03-03: 150 mg via INTRAVENOUS

## 2014-03-03 MED ORDER — 0.9 % SODIUM CHLORIDE (POUR BTL) OPTIME
TOPICAL | Status: DC | PRN
  Administered 2014-03-03: 200 mL

## 2014-03-03 MED ORDER — HYDROMORPHONE HCL 1 MG/ML IJ SOLN: 0.2500 mg | INTRAMUSCULAR | Status: DC | PRN

## 2014-03-03 MED ORDER — FENTANYL CITRATE 0.05 MG/ML IJ SOLN
50.0000 ug | INTRAMUSCULAR | Status: DC | PRN
  Administered 2014-03-03: 100 ug via INTRAVENOUS

## 2014-03-03 MED ORDER — FENTANYL CITRATE 0.05 MG/ML IJ SOLN
INTRAMUSCULAR | Status: AC
  Filled 2014-03-03: qty 6

## 2014-03-03 MED ORDER — LIDOCAINE HCL (CARDIAC) 20 MG/ML IV SOLN
INTRAVENOUS | Status: DC | PRN
  Administered 2014-03-03: 30 mg via INTRAVENOUS

## 2014-03-03 MED ORDER — SUCCINYLCHOLINE CHLORIDE 20 MG/ML IJ SOLN
INTRAMUSCULAR | Status: AC
  Filled 2014-03-03: qty 1

## 2014-03-03 MED ORDER — DEXAMETHASONE SODIUM PHOSPHATE 10 MG/ML IJ SOLN
INTRAMUSCULAR | Status: DC | PRN
  Administered 2014-03-03: 10 mg via INTRAVENOUS

## 2014-03-03 MED ORDER — PROPOFOL 10 MG/ML IV EMUL
INTRAVENOUS | Status: AC
  Filled 2014-03-03: qty 50

## 2014-03-03 MED ORDER — CEFAZOLIN SODIUM-DEXTROSE 2-3 GM-% IV SOLR
2.0000 g | INTRAVENOUS | Status: AC
  Administered 2014-03-03: 2 g via INTRAVENOUS

## 2014-03-03 MED ORDER — BACITRACIN ZINC 500 UNIT/GM EX OINT
TOPICAL_OINTMENT | CUTANEOUS | Status: AC
  Filled 2014-03-03: qty 28.35

## 2014-03-03 MED ORDER — LIDOCAINE-EPINEPHRINE (PF) 1.5 %-1:200000 IJ SOLN
INTRAMUSCULAR | Status: DC | PRN
  Administered 2014-03-03: 30 mL via PERINEURAL

## 2014-03-03 MED ORDER — SODIUM CHLORIDE 0.9 % IV SOLN: INTRAVENOUS | Status: DC

## 2014-03-03 MED ORDER — EPHEDRINE SULFATE 50 MG/ML IJ SOLN
INTRAMUSCULAR | Status: DC | PRN
  Administered 2014-03-03: 10 mg via INTRAVENOUS

## 2014-03-03 MED ORDER — OXYCODONE HCL 5 MG/5ML PO SOLN: 5.0000 mg | Freq: Once | ORAL | Status: DC | PRN

## 2014-03-03 MED ORDER — ACETAMINOPHEN 500 MG PO TABS
ORAL_TABLET | ORAL | Status: AC
  Filled 2014-03-03: qty 2

## 2014-03-03 MED ORDER — BUPIVACAINE-EPINEPHRINE (PF) 0.5% -1:200000 IJ SOLN
INTRAMUSCULAR | Status: DC | PRN
  Administered 2014-03-03: 30 mL via PERINEURAL

## 2014-03-03 MED ORDER — FENTANYL CITRATE 0.05 MG/ML IJ SOLN
INTRAMUSCULAR | Status: DC | PRN
  Administered 2014-03-03: 50 ug via INTRAVENOUS

## 2014-03-03 MED ORDER — LACTATED RINGERS IV SOLN
INTRAVENOUS | Status: DC
  Administered 2014-03-03 (×2): via INTRAVENOUS

## 2014-03-03 MED ORDER — BUPIVACAINE-EPINEPHRINE (PF) 0.5% -1:200000 IJ SOLN
INTRAMUSCULAR | Status: AC
  Filled 2014-03-03: qty 30

## 2014-03-03 MED ORDER — MEPERIDINE HCL 25 MG/ML IJ SOLN: 6.2500 mg | INTRAMUSCULAR | Status: DC | PRN

## 2014-03-03 MED ORDER — HYDROCODONE-ACETAMINOPHEN 5-325 MG PO TABS: 1.0000 | ORAL_TABLET | Freq: Four times a day (QID) | ORAL | Status: DC | PRN

## 2014-03-03 MED ORDER — FENTANYL CITRATE 0.05 MG/ML IJ SOLN
INTRAMUSCULAR | Status: AC
  Filled 2014-03-03: qty 2

## 2014-03-03 MED ORDER — BACITRACIN ZINC 500 UNIT/GM EX OINT
TOPICAL_OINTMENT | CUTANEOUS | Status: DC | PRN
  Administered 2014-03-03: 1 via TOPICAL

## 2014-03-03 MED ORDER — ONDANSETRON HCL 4 MG/2ML IJ SOLN: 4.0000 mg | Freq: Once | INTRAMUSCULAR | Status: DC | PRN

## 2014-03-03 MED ORDER — ACETAMINOPHEN 500 MG PO TABS: 1000.0000 mg | ORAL_TABLET | Freq: Once | ORAL | Status: DC

## 2014-03-03 MED ORDER — MIDAZOLAM HCL 2 MG/2ML IJ SOLN
INTRAMUSCULAR | Status: AC
  Filled 2014-03-03: qty 2

## 2014-03-03 MED ORDER — CEFAZOLIN SODIUM 1-5 GM-% IV SOLN
INTRAVENOUS | Status: AC
  Filled 2014-03-03: qty 100

## 2014-03-03 MED ORDER — OXYCODONE HCL 5 MG PO TABS: 5.0000 mg | ORAL_TABLET | Freq: Once | ORAL | Status: DC | PRN

## 2014-03-03 MED ORDER — ONDANSETRON HCL 4 MG/2ML IJ SOLN
INTRAMUSCULAR | Status: DC | PRN
  Administered 2014-03-03: 4 mg via INTRAVENOUS

## 2014-03-03 MED ORDER — MIDAZOLAM HCL 2 MG/2ML IJ SOLN
1.0000 mg | INTRAMUSCULAR | Status: DC | PRN
  Administered 2014-03-03: 2 mg via INTRAVENOUS

## 2014-03-03 SURGICAL SUPPLY — 74 items
BANDAGE ESMARK 6X9 LF (GAUZE/BANDAGES/DRESSINGS) ×2 IMPLANT
BANDAGE GAUZE 4  KLING STR (GAUZE/BANDAGES/DRESSINGS) IMPLANT
BLADE AVERAGE 25X9 (BLADE) ×3 IMPLANT
BLADE MINI RND TIP GREEN BEAV (BLADE) IMPLANT
BLADE OSC/SAG .038X5.5 CUT EDG (BLADE) ×3 IMPLANT
BLADE SURG 15 STRL LF DISP TIS (BLADE) ×6 IMPLANT
BLADE SURG 15 STRL SS (BLADE) ×9
BNDG CMPR 9X4 STRL LF SNTH (GAUZE/BANDAGES/DRESSINGS)
BNDG CMPR 9X6 STRL LF SNTH (GAUZE/BANDAGES/DRESSINGS) ×2
BNDG COHESIVE 4X5 TAN STRL (GAUZE/BANDAGES/DRESSINGS) ×3 IMPLANT
BNDG CONFORM 2 STRL LF (GAUZE/BANDAGES/DRESSINGS) IMPLANT
BNDG CONFORM 3 STRL LF (GAUZE/BANDAGES/DRESSINGS) ×3 IMPLANT
BNDG ESMARK 4X9 LF (GAUZE/BANDAGES/DRESSINGS) IMPLANT
BNDG ESMARK 6X9 LF (GAUZE/BANDAGES/DRESSINGS) ×3
BNDG GAUZE ELAST 4 BULKY (GAUZE/BANDAGES/DRESSINGS) IMPLANT
CAP PIN PROTECTOR ORTHO WHT (CAP) ×6 IMPLANT
CHLORAPREP W/TINT 26ML (MISCELLANEOUS) ×3 IMPLANT
COVER BACK TABLE 60X90IN (DRAPES) ×3 IMPLANT
CUFF TOURNIQUET SINGLE 24IN (TOURNIQUET CUFF) ×2 IMPLANT
CUFF TOURNIQUET SINGLE 34IN LL (TOURNIQUET CUFF) IMPLANT
DRAPE EXTREMITY T 121X128X90 (DRAPE) ×3 IMPLANT
DRAPE OEC MINIVIEW 54X84 (DRAPES) ×3 IMPLANT
DRAPE SURG 17X23 STRL (DRAPES) IMPLANT
DRAPE U-SHAPE 47X51 STRL (DRAPES) ×2 IMPLANT
DRSG EMULSION OIL 3X3 NADH (GAUZE/BANDAGES/DRESSINGS) ×3 IMPLANT
DRSG PAD ABDOMINAL 8X10 ST (GAUZE/BANDAGES/DRESSINGS) ×3 IMPLANT
ELECT REM PT RETURN 9FT ADLT (ELECTROSURGICAL) ×3
ELECTRODE REM PT RTRN 9FT ADLT (ELECTROSURGICAL) ×2 IMPLANT
GAUZE SPONGE 4X4 12PLY STRL (GAUZE/BANDAGES/DRESSINGS) ×4 IMPLANT
GLOVE BIO SURGEON STRL SZ7 (GLOVE) ×1 IMPLANT
GLOVE BIO SURGEON STRL SZ8 (GLOVE) ×3 IMPLANT
GLOVE BIOGEL M STRL SZ7.5 (GLOVE) ×2 IMPLANT
GLOVE BIOGEL PI IND STRL 7.0 (GLOVE) ×1 IMPLANT
GLOVE BIOGEL PI IND STRL 8 (GLOVE) ×3 IMPLANT
GLOVE BIOGEL PI INDICATOR 7.0 (GLOVE)
GLOVE BIOGEL PI INDICATOR 8 (GLOVE) ×2
GLOVE EXAM NITRILE MD LF STRL (GLOVE) ×2 IMPLANT
GOWN STRL REUS W/ TWL LRG LVL3 (GOWN DISPOSABLE) ×2 IMPLANT
GOWN STRL REUS W/ TWL XL LVL3 (GOWN DISPOSABLE) ×3 IMPLANT
GOWN STRL REUS W/TWL LRG LVL3 (GOWN DISPOSABLE)
GOWN STRL REUS W/TWL XL LVL3 (GOWN DISPOSABLE) ×6
K-WIRE .054X4 (WIRE) ×6 IMPLANT
NDL HYPO 25X1 1.5 SAFETY (NEEDLE) IMPLANT
NEEDLE HYPO 22GX1.5 SAFETY (NEEDLE) IMPLANT
NEEDLE HYPO 25X1 1.5 SAFETY (NEEDLE) IMPLANT
NS IRRIG 1000ML POUR BTL (IV SOLUTION) ×3 IMPLANT
PACK BASIN DAY SURGERY FS (CUSTOM PROCEDURE TRAY) ×3 IMPLANT
PAD CAST 4YDX4 CTTN HI CHSV (CAST SUPPLIES) ×2 IMPLANT
PADDING CAST ABS 4INX4YD NS (CAST SUPPLIES) ×1
PADDING CAST ABS COTTON 4X4 ST (CAST SUPPLIES) ×2 IMPLANT
PADDING CAST COTTON 4X4 STRL (CAST SUPPLIES) ×3
PENCIL BUTTON HOLSTER BLD 10FT (ELECTRODE) ×3 IMPLANT
SANITIZER HAND PURELL 535ML FO (MISCELLANEOUS) ×3 IMPLANT
SHEET MEDIUM DRAPE 40X70 STRL (DRAPES) ×3 IMPLANT
SLEEVE SCD COMPRESS KNEE MED (MISCELLANEOUS) ×3 IMPLANT
SPONGE LAP 18X18 X RAY DECT (DISPOSABLE) ×3 IMPLANT
STOCKINETTE 6  STRL (DRAPES) ×1
STOCKINETTE 6 STRL (DRAPES) ×2 IMPLANT
SUCTION FRAZIER TIP 10 FR DISP (SUCTIONS) ×2 IMPLANT
SUT ETHILON 3 0 PS 1 (SUTURE) ×5 IMPLANT
SUT ETHILON 4 0 PS 2 18 (SUTURE) IMPLANT
SUT MNCRL AB 3-0 PS2 18 (SUTURE) ×3 IMPLANT
SUT MNCRL AB 4-0 PS2 18 (SUTURE) IMPLANT
SUT VIC AB 2-0 SH 27 (SUTURE)
SUT VIC AB 2-0 SH 27XBRD (SUTURE) IMPLANT
SUT VIC AB 3-0 PS1 18 (SUTURE)
SUT VIC AB 3-0 PS1 18XBRD (SUTURE) IMPLANT
SYR BULB 3OZ (MISCELLANEOUS) ×3 IMPLANT
SYR CONTROL 10ML LL (SYRINGE) IMPLANT
TOWEL OR 17X24 6PK STRL BLUE (TOWEL DISPOSABLE) ×5 IMPLANT
TOWEL OR NON WOVEN STRL DISP B (DISPOSABLE) ×1 IMPLANT
TUBE CONNECTING 20X1/4 (TUBING) IMPLANT
UNDERPAD 30X30 INCONTINENT (UNDERPADS AND DIAPERS) ×3 IMPLANT
YANKAUER SUCT BULB TIP NO VENT (SUCTIONS) IMPLANT

## 2014-03-03 NOTE — Anesthesia Procedure Notes (Addendum)
Anesthesia Regional Block:  Popliteal block  Pre-Anesthetic Checklist: ,, timeout performed, Correct Patient, Correct Site, Correct Laterality, Correct Procedure, Correct Position, site marked, Risks and benefits discussed,  Surgical consent,  Pre-op evaluation,  At surgeon's request and post-op pain management  Laterality: Left  Prep: chloraprep       Needles:  Injection technique: Single-shot  Needle Type: Echogenic Stimulator Needle     Needle Length: 10cm 10 cm Needle Gauge: 21 and 21 G    Additional Needles:  Procedures: ultrasound guided (picture in chart) and nerve stimulator Popliteal block  Nerve Stimulator or Paresthesia:  Response: 0.4 mA,   Additional Responses:   Narrative:  Start time: 03/03/2014 7:10 AM End time: 03/03/2014 7:20 AM Injection made incrementally with aspirations every 5 mL.  Performed by: Personally   Additional Notes: Monitors applied. Patient sedated. Sterile prep and drape,hand hygiene and sterile gloves were used. Relevant anatomy identified.Needle position confirmed.Local anesthetic injected incrementally after negative aspiration. Local anesthetic spread visualized around nerve(s). Vascular puncture avoided. No complications. Image printed for medical record.The patient tolerated the procedure well.  Additional Saphenous nerve block performed. 15cc Local Anesthetic mixture placed under ultrasonic guidance along the medio-inferior border of the Sartorious muscle 6 inches above the knee.  No Problems encountered.  Lillia Abed MD    Procedure Name: LMA Insertion Date/Time: 03/03/2014 7:38 AM Performed by: Emine Lopata Pre-anesthesia Checklist: Patient identified, Emergency Drugs available, Suction available and Patient being monitored Patient Re-evaluated:Patient Re-evaluated prior to inductionOxygen Delivery Method: Circle System Utilized Preoxygenation: Pre-oxygenation with 100% oxygen Intubation Type: IV induction Ventilation:  Mask ventilation without difficulty LMA: LMA inserted LMA Size: 4.0 Number of attempts: 1 Airway Equipment and Method: bite block Placement Confirmation: positive ETCO2 Tube secured with: Tape Dental Injury: Teeth and Oropharynx as per pre-operative assessment

## 2014-03-03 NOTE — Discharge Instructions (Addendum)
Wylene Simmer, MD [facility]  Please read the following information regarding your care after surgery.  Medications  You only need a prescription for the narcotic pain medicine (ex. oxycodone, Percocet, Norco).  All of the other medicines listed below are available over the counter. X norco as prescribed for severe pain  Narcotic pain medicine (ex. oxycodone, Percocet, Vicodin) will cause constipation.  To prevent this problem, take the following medicines while you are taking any pain medicine. X docusate sodium (Colace) 100 mg twice a day X senna (Senokot) 2 tablets twice a day  Weight Bearing X Bear weight only on the heel of your operated foot in the post-op shoe.  Cast / Splint / Dressing X Keep your splint or cast clean and dry.  Dont put anything (coat hanger, pencil, etc) down inside of it.  If it gets damp, use a hair dryer on the cool setting to dry it.  If it gets soaked, call the office to schedule an appointment for a cast change.  After your dressing, cast or splint is removed; you may shower, but do not soak or scrub the wound.  Allow the water to run over it, and then gently pat it dry.  Swelling It is normal for you to have swelling where you had surgery.  To reduce swelling and pain, keep your toes above your nose for at least 3 days after surgery.  It may be necessary to keep your foot or leg elevated for several weeks.  If it hurts, it should be elevated.  Follow Up Call my office at [phone] when you are discharged from the hospital or surgery center to schedule an appointment to be seen two weeks after surgery.  Call my office at [phone] if you develop a fever >101.5 F, nausea, vomiting, bleeding from the surgical site or severe pain.     Post Anesthesia Home Care Instructions  Activity: Get plenty of rest for the remainder of the day. A responsible adult should stay with you for 24 hours following the procedure.  For the next 24 hours, DO  NOT: -Drive a car -Paediatric nurse -Drink alcoholic beverages -Take any medication unless instructed by your physician -Make any legal decisions or sign important papers.  Meals: Start with liquid foods such as gelatin or soup. Progress to regular foods as tolerated. Avoid greasy, spicy, heavy foods. If nausea and/or vomiting occur, drink only clear liquids until the nausea and/or vomiting subsides. Call your physician if vomiting continues.  Special Instructions/Symptoms: Your throat may feel dry or sore from the anesthesia or the breathing tube placed in your throat during surgery. If this causes discomfort, gargle with warm salt water. The discomfort should disappear within 24 hours.   Regional Anesthesia Blocks  1. Numbness or the inability to move the "blocked" extremity may last from 3-48 hours after placement. The length of time depends on the medication injected and your individual response to the medication. If the numbness is not going away after 48 hours, call your surgeon.  2. The extremity that is blocked will need to be protected until the numbness is gone and the  Strength has returned. Because you cannot feel it, you will need to take extra care to avoid injury. Because it may be weak, you may have difficulty moving it or using it. You may not know what position it is in without looking at it while the block is in effect.  3. For blocks in the legs and feet, returning to weight  bearing and walking needs to be done carefully. You will need to wait until the numbness is entirely gone and the strength has returned. You should be able to move your leg and foot normally before you try and bear weight or walk. You will need someone to be with you when you first try to ensure you do not fall and possibly risk injury.  4. Bruising and tenderness at the needle site are common side effects and will resolve in a few days.  5. Persistent numbness or new problems with movement should be  communicated to the surgeon or the [facility] [phone] [place] [phone]).

## 2014-03-03 NOTE — Brief Op Note (Signed)
03/03/2014  9:31 AM  PATIENT:  Kendra Taylor  68 y.o. female  PRE-OPERATIVE DIAGNOSIS:  Left second through fifth clawtoe deformities  POST-OPERATIVE DIAGNOSIS:  Left second through fifth clawtoe deformities  Procedure(s): LEFT SECOND THROUGH FIFTH METATARSAL HEAD EXCISION Left SECOND THROUGH FIFTH HAMMERTOE CORRECTION Left foot AP and lateral xray  SURGEON:  Wylene Simmer, MD  ASSISTANT: n/a  ANESTHESIA:   General, regional  EBL:  minimal   TOURNIQUET:   Total Tourniquet Time Documented: Thigh (Left) - 63 minutes Total: Thigh (Left) - 63 minutes   COMPLICATIONS:  None apparent  DISPOSITION:  Extubated, awake and stable to recovery.  DICTATION ID:  [id number]

## 2014-03-03 NOTE — Op Note (Signed)
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  POSTOPERATIVE DIAGNOSIS:  Left forefoot second through fifth claw toe deformities.  PROCEDURE: 1. Left second through fifth metatarsal head excisions. 2. Left second through fifth hammertoe corrections through separate     incisions. 3. Left foot AP and lateral radiographs.  SURGEON:  Wylene Simmer, MD  ANESTHESIA:  General, regional.  ESTIMATED BLOOD LOSS:  Minimal.  TOURNIQUET TIME:  64 minutes at 250 mmHg.  COMPLICATIONS:  None apparent.  DISPOSITION:  Extubated, awake, and stable to recovery.  INDICATIONS FOR PROCEDURE:  The patient is a 68 year old female with past medical history significant for rheumatoid arthritis.  She has undergone previous rheumatoid forefoot reconstruction on the right as well as hallux MP joint arthrodesis on the left.  She complains of pain at the metatarsal heads plantarly as well as the toes rubbing against the top of her shoe.  She presents today for claw toe corrections on the left foot second through fifth toes.  She understands the risks and benefits, the alternative treatment options, and elects surgical treatment.  She specifically understands the risks of bleeding, infection, nerve damage, blood clots, need for additional surgery, continued pain, amputation, and death.  PROCEDURE IN DETAIL:  After preoperative consent was obtained and the correct operative site was identified, the patient was brought to the operating room and placed supine on the operating table.  General anesthesia was induced.  Preoperative antibiotics were administered. Surgical  time-out was taken.  Left lower extremity was prepped and draped in standard sterile fashion with a tourniquet around the thigh. The extremity was exsanguinated.  The tourniquet was inflated to 250 mmHg.  A curvilinear incision was made on the plantar surface of the foot just proximal from the weightbearing skin.  Sharp dissection was carried down through the skin and subcutaneous tissue.  Blunt dissection was then carried longitudinally between the metatarsal heads.  The toes were all noted to be dislocated.  The metatarsal heads were relatively plantar.  The neurovascular bundles were displaced laterally.  The second metatarsal head was addressed first.  The periosteum plantarly was incised.  The flexor tendons were retracted and protected.  The oscillating saw was used to cut through the neck of the metatarsal.  The head was then subperiosteally dissected and removed in its entirety. This same procedure was then carried out for the third, fourth and fifth metatarsal heads removing them all in their entirety.  Attention was then turned to the dorsum of the foot where transverse incision was made over the second toe PIP joint.  Sharp dissection was carried down through the skin and subcutaneous tissue and extensor mechanism.  The collateral ligaments were released.  The oscillating saw was then used to resect the head of the proximal  phalanx and the base of the middle phalanx.  A 0.054 K-wire was then inserted out through the tip of the toe and then retrograde across the PIP joint.  This same procedure was then repeated for the third and fourth toes.  Closed osteoclasis was then performed correcting the fifth toe malalignment. The fifth toe was then pinned.  The pin was advanced across the MP joint into the fifth metatarsal shaft.  This was then repeated for the second, third and fourth toes.  AP and lateral radiographs were obtained confirming appropriate position of all 4 pins,  appropriate correction of the clawtoe deformities, and complete excision of the second, third, fourth and fifth metatarsal heads.  The 4 pins were then bent, trimmed, and capped.  The plantar incision was closed with horizontal mattress sutures of 3-0 nylon.  The tourniquet was released.  All 4 lesser toes were noted to be perfused appropriately.  The dorsal incisions were all closed with horizontal mattress sutures of 3-0 nylon.  Sterile dressings were applied followed by compression wrap.  The patient was then awakened from anesthesia and transported to recovery room in stable condition.  FOLLOWUP PLAN:  The patient will be weightbearing as tolerated on her heel and a Darco wedge down shoe.  She will follow up with me in 2 weeks for suture removal.  We will try to leave the pins in for 6 weeks postoperatively.  RADIOGRAPHS:  AP and lateral radiographs of the left foot are obtained intraoperatively.  These show interval resection of the second, third, fourth, and fifth metatarsal head as well as correction of the second, third, fourth and fifth clawtoe deformities with appropriately positioned hardware.  No other acute injury was noted.     Wylene Simmer, MD     JH/MEDQ  D:  03/03/2014  T:  03/03/2014  Job:  [id number]

## 2014-03-03 NOTE — Transfer of Care (Signed)
Immediate Anesthesia Transfer of Care Note  Patient: Kendra Taylor  Procedure(s) Performed: Procedure(s): LEFT SECOND THROUGH FIFTH METATARSAL HEAD EXCISION (Left) SECOND THROUGH FIFTH HAMMERTOE CORRECTION (Left)  Patient Location: PACU  Anesthesia Type:GA combined with regional for post-op pain  Level of Consciousness: awake and patient cooperative  Airway & Oxygen Therapy: Patient Spontanous Breathing and Patient connected to face mask oxygen  Post-op Assessment: Report given to PACU RN and Post -op Vital signs reviewed and stable  Post vital signs: Reviewed and stable  Complications: No apparent anesthesia complications

## 2014-03-03 NOTE — Anesthesia Preprocedure Evaluation (Addendum)
Anesthesia Evaluation  Patient identified by MRN, date of birth, ID band Patient awake    Reviewed: Allergy & Precautions, H&P , NPO status , Patient's Chart, lab work & pertinent test results  Airway Mallampati: I  TM Distance: >3 FB Neck ROM: Full    Dental   Pulmonary sleep apnea ,          Cardiovascular     Neuro/Psych  Headaches, PSYCHIATRIC DISORDERS Anxiety Depression    GI/Hepatic GERD-  Medicated and Controlled,  Endo/Other    Renal/GU      Musculoskeletal  (+) Arthritis -, Rheumatoid disorders,    Abdominal   Peds  Hematology  (+) anemia ,   Anesthesia Other Findings   Reproductive/Obstetrics                            Anesthesia Physical Anesthesia Plan  ASA: III  Anesthesia Plan: General   Post-op Pain Management:    Induction: Intravenous  Airway Management Planned: LMA  Additional Equipment:   Intra-op Plan:   Post-operative Plan: Extubation in OR  Informed Consent: I have reviewed the patients History and Physical, chart, labs and discussed the procedure including the risks, benefits and alternatives for the proposed anesthesia with the patient or authorized representative who has indicated his/her understanding and acceptance.     Plan Discussed with: CRNA and Surgeon  Anesthesia Plan Comments:         Anesthesia Quick Evaluation

## 2014-03-03 NOTE — Anesthesia Postprocedure Evaluation (Signed)
Anesthesia Post Note  Patient: Kendra Taylor  Procedure(s) Performed: Procedure(s) (LRB): LEFT SECOND THROUGH FIFTH METATARSAL HEAD EXCISION (Left) SECOND THROUGH FIFTH HAMMERTOE CORRECTION (Left)  Anesthesia type: general  Patient location: PACU  Post pain: Pain level controlled  Post assessment: Patient's Cardiovascular Status Stable  Last Vitals:  Filed Vitals:   03/03/14 1025  BP: 129/48  Pulse: 96  Temp: 36.4 C  Resp:     Post vital signs: Reviewed and stable  Level of consciousness: sedated  Complications: No apparent anesthesia complications

## 2014-03-03 NOTE — Progress Notes (Signed)
Assisted Dr. Conrad Adams with left, ultrasound guided, popliteal/saphenous block. Side rails up, monitors on throughout procedure. See vital signs in flow sheet. Tolerated Procedure well.

## 2014-03-03 NOTE — H&P (Signed)
Kendra Taylor is an 68 y.o. female.   Chief Complaint:  Left foot pain HPI: 68 y/o female with painful 2-5 claw toe deformities.  She presents now for left 2-5th mt head excisions and clawtoe corrections.  Past Medical History  Diagnosis Date  . Arthritis   . Migraine   . Sleep apnea     cpap  . Anemia   . Anxiety   . Depression   . GERD (gastroesophageal reflux disease)     Past Surgical History  Procedure Laterality Date  . Abdominal hysterectomy    . Oophorectomy    . Bunionectomy      both  . Finger surgery    . Finger arthroplasty Right 07/30/2012    Procedure: RIGHT INDEX FINGER, MIDDLE FINGER, RING FINGER, SMALL FINGER MCP ARTHROPLASTY WITH REALIGNMENT/REPAIR OF EXTENSOR TENDON;  Surgeon: Roseanne Kaufman, MD;  Location: [facility];  Service: Orthopedics;  Laterality: Right;  . Joint replacement      Family History  Problem Relation Age of Onset  . Dementia Mother   . Sleep apnea Mother   . Hyperthyroidism Mother   . Dementia Father   . Sleep apnea Father    Social History:  reports that she has never smoked. She does not have any smokeless tobacco history on file. She reports that she does not drink alcohol or use illicit drugs.  Allergies:  Allergies  Allergen Reactions  . Aspirin     Ringing in ears with small doses, large doses causes her to lose hearing    Medications Prior to Admission  Medication Sig Dispense Refill  . CALCIUM CARBONATE PO Take 1,000 mg by mouth daily.    . cholecalciferol (VITAMIN D) 1000 UNITS tablet Take 1,000 Units by mouth daily.    . folic acid (FOLVITE) 1 MG tablet Take 1 mg by mouth daily.      Marland Kitchen inFLIXimab (REMICADE) 100 MG injection Inject into the vein every 8 (eight) weeks.     . magnesium gluconate (MAGONATE) 500 MG tablet Take 500 mg by mouth daily.    . meloxicam (MOBIC) 15 MG tablet Take 15 mg by mouth daily.    . methotrexate (RHEUMATREX) 2.5 MG tablet Take 15 mg by mouth every Friday.    . pantoprazole (PROTONIX) 40 MG  tablet Take 40 mg by mouth every other day.    . zolpidem (AMBIEN) 10 MG tablet Take 10 mg by mouth at bedtime as needed for sleep.    . cycloSPORINE (RESTASIS) 0.05 % ophthalmic emulsion Place 1 drop into both eyes 2 (two) times daily.    . ondansetron (ZOFRAN-ODT) 4 MG disintegrating tablet Take 4 mg by mouth every 8 (eight) hours as needed for nausea.    . SUMAtriptan (IMITREX) 100 MG tablet Take 100 mg by mouth every 2 (two) hours as needed for migraine.    . vitamin B-12 (CYANOCOBALAMIN) 1000 MCG tablet Take 1,000 mcg by mouth daily.      No results found for this or any previous visit (from the past 48 hour(s)). No results found.  ROS  No recent f/c/v/n/wt loss  Blood pressure 119/78, pulse 74, temperature 97.2 F (36.2 C), temperature source Oral, resp. rate 20, height 5\' 2"  (1.575 m), weight 58.968 kg (130 lb), SpO2 99 %. Physical Exam  wn wd woman in nad.  A and O x 4.  Mood and affect normal.  EOMI.  resp unlabored.  L foot with 2-5 clawtoe deformities that are not passively correctable.  sens to LT intact.  1+ dp and pt pulses.  5/5 strength in PF and DF of the ankle.  Assessment/Plan L 2-5 claw toes - to OR for MT head excisions and hammertoe corrections.  The risks and benefits of the alternative treatment options have been discussed in detail.  The patient wishes to proceed with surgery and specifically understands risks of bleeding, infection, nerve damage, blood clots, need for additional surgery, amputation and death.   Wylene Simmer Mar 14, 2014, 7:17 AM

## 2014-03-04 ENCOUNTER — Encounter (HOSPITAL_BASED_OUTPATIENT_CLINIC_OR_DEPARTMENT_OTHER): Payer: Self-pay | Admitting: Orthopedic Surgery

## 2014-03-22 DIAGNOSIS — K645 Perianal venous thrombosis: Secondary | ICD-10-CM | POA: Diagnosis not present

## 2014-04-01 DIAGNOSIS — M0589 Other rheumatoid arthritis with rheumatoid factor of multiple sites: Secondary | ICD-10-CM | POA: Diagnosis not present

## 2014-04-13 DIAGNOSIS — Z4789 Encounter for other orthopedic aftercare: Secondary | ICD-10-CM | POA: Diagnosis not present

## 2014-04-18 ENCOUNTER — Other Ambulatory Visit: Payer: Self-pay | Admitting: Internal Medicine

## 2014-04-18 DIAGNOSIS — R519 Headache, unspecified: Secondary | ICD-10-CM

## 2014-04-18 DIAGNOSIS — R51 Headache: Secondary | ICD-10-CM | POA: Diagnosis not present

## 2014-04-19 ENCOUNTER — Ambulatory Visit
Admission: RE | Admit: 2014-04-19 | Discharge: 2014-04-19 | Disposition: A | Discharge status: Home / Self Care | Payer: MEDICARE | Source: Ambulatory Visit | Attending: Internal Medicine | Admitting: Internal Medicine

## 2014-04-19 DIAGNOSIS — R519 Headache, unspecified: Secondary | ICD-10-CM

## 2014-04-19 DIAGNOSIS — R51 Headache: Secondary | ICD-10-CM | POA: Diagnosis not present

## 2014-04-20 ENCOUNTER — Inpatient Hospital Stay: Admission: RE | Admit: 2014-04-20 | Payer: MEDICARE | Source: Ambulatory Visit

## 2014-04-25 DIAGNOSIS — M150 Primary generalized (osteo)arthritis: Secondary | ICD-10-CM | POA: Diagnosis not present

## 2014-04-25 DIAGNOSIS — M79673 Pain in unspecified foot: Secondary | ICD-10-CM | POA: Diagnosis not present

## 2014-04-25 DIAGNOSIS — Z79899 Other long term (current) drug therapy: Secondary | ICD-10-CM | POA: Diagnosis not present

## 2014-04-25 DIAGNOSIS — M0589 Other rheumatoid arthritis with rheumatoid factor of multiple sites: Secondary | ICD-10-CM | POA: Diagnosis not present

## 2014-04-25 DIAGNOSIS — Z13820 Encounter for screening for osteoporosis: Secondary | ICD-10-CM | POA: Diagnosis not present

## 2014-04-25 DIAGNOSIS — M859 Disorder of bone density and structure, unspecified: Secondary | ICD-10-CM | POA: Diagnosis not present

## 2014-04-26 ENCOUNTER — Ambulatory Visit (INDEPENDENT_AMBULATORY_CARE_PROVIDER_SITE_OTHER): Payer: MEDICARE | Admitting: Neurology

## 2014-04-26 ENCOUNTER — Encounter: Payer: Self-pay | Admitting: Neurology

## 2014-04-26 VITALS — BP 129/79 | HR 74 | Ht 63.0 in | Wt 128.0 lb

## 2014-04-26 DIAGNOSIS — G44309 Post-traumatic headache, unspecified, not intractable: Secondary | ICD-10-CM

## 2014-04-26 DIAGNOSIS — R51 Headache: Secondary | ICD-10-CM

## 2014-04-26 DIAGNOSIS — S0990XS Unspecified injury of head, sequela: Principal | ICD-10-CM

## 2014-04-26 DIAGNOSIS — R519 Headache, unspecified: Secondary | ICD-10-CM | POA: Insufficient documentation

## 2014-04-26 NOTE — Progress Notes (Signed)
PATIENT: Kendra Taylor DOB: August 17, 1945  HISTORICAL  Kendra Taylor is a 68 yo RH female was referred by her primary care physician Dr. Shelia Media for evaluation of one episodes of severe headaches  She had a past medical history of migraine headache, she has been taking Imitrex as needed, she has much less frequent migraines now since menopause, use Imitrex once every months, or every few months, her typical migraines are bilateral retro-orbital area severe pounding headaches, with associated light noise sensitivity, lasting for a few hours  Trigger for her migraines are methotrexate, stress, certain food,  In April 12 2014, after a few days of stress, drink a very sweet cold drinks in the restaurant, she had sudden onset severe pounding headaches, bilateral retro-orbital area, nausea, vomited few times, failed to respond Imitrex, lasting for few days, eventually improved, even now, she complains of mild bilateral frontal area pressure headaches each morning, improved after a cup of coffee  She denies dysarthria, no lateralized motor or sensory deficit.  She has deformity of bilateral hands, and feet, due to long-standing rheumatoid arthritis, she is taking methotrexate, 2.5 mg 6 tablets every night.  CAT scan of the brain showed no acute lesions,  Both her parents suffered dementia, she denies significant memory loss, exercise regularly, is a retired Arboriculturist  REVIEW OF SYSTEMS: Full 14 system review of systems performed and notable only for eye pain, snoring,   ALLERGIES: Allergies  Allergen Reactions  . Aspirin     Ringing in ears with small doses, large doses causes her to lose hearing    HOME MEDICATIONS: Current Outpatient Prescriptions on File Prior to Visit  Medication Sig Dispense Refill  . CALCIUM CARBONATE PO Take 1,000 mg by mouth daily.    . cholecalciferol (VITAMIN D) 1000 UNITS tablet Take 1,000 Units by mouth daily.    . cycloSPORINE (RESTASIS)  0.05 % ophthalmic emulsion Place 1 drop into both eyes 2 (two) times daily.    . folic acid (FOLVITE) 1 MG tablet Take 1 mg by mouth daily.      Marland Kitchen HYDROcodone-acetaminophen (NORCO) 5-325 MG per tablet Take 1-2 tablets by mouth every 6 (six) hours as needed. 30 tablet 0  . inFLIXimab (REMICADE) 100 MG injection Inject into the vein every 8 (eight) weeks.     . magnesium gluconate (MAGONATE) 500 MG tablet Take 500 mg by mouth daily.    . meloxicam (MOBIC) 15 MG tablet Take 15 mg by mouth daily.    . methotrexate (RHEUMATREX) 2.5 MG tablet Take 15 mg by mouth every Friday.    . ondansetron (ZOFRAN-ODT) 4 MG disintegrating tablet Take 4 mg by mouth every 8 (eight) hours as needed for nausea.    . pantoprazole (PROTONIX) 40 MG tablet Take 40 mg by mouth every other day.    . SUMAtriptan (IMITREX) 100 MG tablet Take 100 mg by mouth every 2 (two) hours as needed for migraine.    . vitamin B-12 (CYANOCOBALAMIN) 1000 MCG tablet Take 1,000 mcg by mouth daily.    Marland Kitchen zolpidem (AMBIEN) 10 MG tablet Take 10 mg by mouth at bedtime as needed for sleep.     No current facility-administered medications on file prior to visit.    PAST MEDICAL HISTORY: Past Medical History  Diagnosis Date  . Arthritis   . Migraine   . Sleep apnea     cpap  . Anemia   . Anxiety   . Depression   . GERD (gastroesophageal reflux  disease)   . HA (headache)     PAST SURGICAL HISTORY: Past Surgical History  Procedure Laterality Date  . Abdominal hysterectomy    . Oophorectomy    . Bunionectomy      both  . Finger surgery    . Finger arthroplasty Right 07/30/2012    Procedure: RIGHT INDEX FINGER, MIDDLE FINGER, RING FINGER, SMALL FINGER MCP ARTHROPLASTY WITH REALIGNMENT/REPAIR OF EXTENSOR TENDON;  Surgeon: Roseanne Kaufman, MD;  Location: [facility];  Service: Orthopedics;  Laterality: Right;  . Joint replacement    . Metatarsal head excision Left 03/03/2014    Procedure: LEFT SECOND THROUGH FIFTH METATARSAL HEAD EXCISION;   Surgeon: Wylene Simmer, MD;  Location: [facility];  Service: Orthopedics;  Laterality: Left;  . Hammer toe surgery Left 03/03/2014    Procedure: SECOND THROUGH FIFTH HAMMERTOE CORRECTION;  Surgeon: Wylene Simmer, MD;  Location: [facility];  Service: Orthopedics;  Laterality: Left;    FAMILY HISTORY: Family History  Problem Relation Age of Onset  . Dementia Mother   . Sleep apnea Mother   . Hyperthyroidism Mother   . Dementia Father   . Sleep apnea Father     SOCIAL HISTORY:  History   Social History  . Marital Status: Married    Spouse Name: Kendra Taylor    Number of Children: 0  . Years of Education: masters   Occupational History    Retired-as Teacher, adult education for [employer]   Social History Main Topics  . Smoking status: Never Smoker   . Smokeless tobacco: Never Used  . Alcohol Use: No  . Drug Use: No  . Sexual Activity: Not on file   Other Topics Concern  . Not on file   Social History Narrative   Patient lives at home with her spouse (Kendra Taylor.) Patient has two cats.   Retired.   Arboriculturist.   Right handed.   Caffeine One cup of coffee daily.        PHYSICAL EXAM   Filed Vitals:   04/26/14 0847  BP: 129/79  Pulse: 74  Height: 5\' 3"  (1.6 m)  Weight: 128 lb (58.06 kg)    Not recorded      Body mass index is 22.68 kg/(m^2).   Generalized: In no acute distress  Neck: Supple, no carotid bruits   Cardiac: Regular rate rhythm  Pulmonary: Clear to auscultation bilaterally  Musculoskeletal: No deformity  Neurological examination  Mentation: Alert oriented to time, place, history taking, and causual conversation  Cranial nerve II-XII: Pupils were equal round reactive to light. Extraocular movements were full.  Visual field were full on confrontational test. Bilateral fundi were sharp.  Facial sensation and strength were normal. Hearing was intact to finger rubbing bilaterally. Uvula tongue midline.  Head turning and  shoulder shrug and were normal and symmetric.Tongue protrusion into cheek strength was normal.  Motor: Deformity of bilateral hands joints,   Sensory: Intact to fine touch, pinprick, preserved vibratory sensation, and proprioception at toes.  Coordination: Normal finger to nose, heel-to-shin bilaterally there was no truncal ataxia  Gait: Mild cautious, steady  Romberg signs: Negative  Deep tendon reflexes: Brachioradialis 2/2, biceps 2/2, triceps 2/2, patellar 2/2, Achilles trace, plantar responses were flexor bilaterally.   DIAGNOSTIC DATA (LABS, IMAGING, TESTING) - I reviewed patient records, labs, notes, testing and imaging myself where available.  Lab Results  Component Value Date   WBC 6.4 07/22/2012   HGB 14.2 03/03/2014   HCT 42.4 07/22/2012   MCV 83.6 07/22/2012  PLT 218 07/22/2012      Component Value Date/Time   NA 142 07/22/2012 0950   K 4.5 07/22/2012 0950   CL 103 07/22/2012 0950   CO2 32 07/22/2012 0950   GLUCOSE 72 07/22/2012 0950   BUN 23 07/22/2012 0950   CREATININE 0.81 07/22/2012 0950   CALCIUM 9.8 07/22/2012 0950   GFRNONAA 72* 07/22/2012 0950   GFRAA 84* 07/22/2012 0950   ASSESSMENT AND PLAN  Kendra Taylor is a 68 y.o. female with long standing history of migraine, Rheumatoid arthritis, presenting with one set onset prolonged headaches on April 12 2014, CAT scan of the brain showed no acute changes, no her headache has much improved, most likely it was a variant of her migraine,    1, I have suggested Imitrex subcutaneous injection as needed, 2, she will call my office if she continues to have prolonged severe headaches 3 she also has family history of dementia, both her parents suffered dementia, she is highly functional, I have suggested her continue moderate exercise, 4, return to clinic as needed.  Marcial Pacas, M.D. Ph.D.  [facility] [address] [place], [place] [address] [phone]

## 2014-05-04 DIAGNOSIS — G43839 Menstrual migraine, intractable, without status migrainosus: Secondary | ICD-10-CM | POA: Diagnosis not present

## 2014-05-04 DIAGNOSIS — G43101 Migraine with aura, not intractable, with status migrainosus: Secondary | ICD-10-CM | POA: Diagnosis not present

## 2014-05-04 DIAGNOSIS — Z049 Encounter for examination and observation for unspecified reason: Secondary | ICD-10-CM | POA: Diagnosis not present

## 2014-05-04 DIAGNOSIS — G43719 Chronic migraine without aura, intractable, without status migrainosus: Secondary | ICD-10-CM | POA: Diagnosis not present

## 2014-05-11 DIAGNOSIS — Z4789 Encounter for other orthopedic aftercare: Secondary | ICD-10-CM | POA: Diagnosis not present

## 2014-05-11 DIAGNOSIS — M2042 Other hammer toe(s) (acquired), left foot: Secondary | ICD-10-CM | POA: Diagnosis not present

## 2014-05-12 DIAGNOSIS — H2513 Age-related nuclear cataract, bilateral: Secondary | ICD-10-CM | POA: Diagnosis not present

## 2014-05-17 DIAGNOSIS — M0589 Other rheumatoid arthritis with rheumatoid factor of multiple sites: Secondary | ICD-10-CM | POA: Diagnosis not present

## 2014-06-16 DIAGNOSIS — G43839 Menstrual migraine, intractable, without status migrainosus: Secondary | ICD-10-CM | POA: Diagnosis not present

## 2014-06-16 DIAGNOSIS — G43719 Chronic migraine without aura, intractable, without status migrainosus: Secondary | ICD-10-CM | POA: Diagnosis not present

## 2014-07-12 DIAGNOSIS — M0589 Other rheumatoid arthritis with rheumatoid factor of multiple sites: Secondary | ICD-10-CM | POA: Diagnosis not present

## 2014-07-27 DIAGNOSIS — G43101 Migraine with aura, not intractable, with status migrainosus: Secondary | ICD-10-CM | POA: Diagnosis not present

## 2014-07-27 DIAGNOSIS — G43839 Menstrual migraine, intractable, without status migrainosus: Secondary | ICD-10-CM | POA: Diagnosis not present

## 2014-07-27 DIAGNOSIS — G43719 Chronic migraine without aura, intractable, without status migrainosus: Secondary | ICD-10-CM | POA: Diagnosis not present

## 2014-08-24 DIAGNOSIS — M150 Primary generalized (osteo)arthritis: Secondary | ICD-10-CM | POA: Diagnosis not present

## 2014-08-24 DIAGNOSIS — M79673 Pain in unspecified foot: Secondary | ICD-10-CM | POA: Diagnosis not present

## 2014-08-24 DIAGNOSIS — M0589 Other rheumatoid arthritis with rheumatoid factor of multiple sites: Secondary | ICD-10-CM | POA: Diagnosis not present

## 2014-08-30 DIAGNOSIS — M0589 Other rheumatoid arthritis with rheumatoid factor of multiple sites: Secondary | ICD-10-CM | POA: Diagnosis not present

## 2014-09-27 DIAGNOSIS — G43839 Menstrual migraine, intractable, without status migrainosus: Secondary | ICD-10-CM | POA: Diagnosis not present

## 2014-09-27 DIAGNOSIS — G43719 Chronic migraine without aura, intractable, without status migrainosus: Secondary | ICD-10-CM | POA: Diagnosis not present

## 2014-09-27 DIAGNOSIS — G43901 Migraine, unspecified, not intractable, with status migrainosus: Secondary | ICD-10-CM | POA: Diagnosis not present

## 2014-10-18 DIAGNOSIS — M0589 Other rheumatoid arthritis with rheumatoid factor of multiple sites: Secondary | ICD-10-CM | POA: Diagnosis not present

## 2014-10-25 ENCOUNTER — Ambulatory Visit (INDEPENDENT_AMBULATORY_CARE_PROVIDER_SITE_OTHER): Payer: MEDICARE | Admitting: Podiatry

## 2014-10-25 ENCOUNTER — Encounter: Payer: Self-pay | Admitting: Podiatry

## 2014-10-25 VITALS — BP 109/68 | HR 65 | Temp 98.0°F | Resp 18

## 2014-10-25 DIAGNOSIS — B351 Tinea unguium: Secondary | ICD-10-CM

## 2014-10-25 DIAGNOSIS — M79673 Pain in unspecified foot: Secondary | ICD-10-CM | POA: Diagnosis not present

## 2014-10-25 DIAGNOSIS — M069 Rheumatoid arthritis, unspecified: Secondary | ICD-10-CM | POA: Diagnosis not present

## 2014-10-25 NOTE — Progress Notes (Signed)
   Subjective:    Patient ID: Kendra Taylor, female    DOB: [birth year], 69 y.o.   MRN: [id number]  HPI MY BIG TOENAIL ON MY LEFT FOOT IS THICK AND DISCOLORED AND THROBS AND I HAVE  SEEN DR Blenda Mounts IN THE PAST AND MY HUSBAND STEPPED ON IT YEARS AGO AND AT THAT TIME IT WAS RED AT THE CUTICLE AND I WAS PUT ON ANTIBIOTICS BY MY DOCTOR AND THERE IS A NAIL THAT IS GROWING UNDER IT   There is a history of trauma to the left hallux nail approximate 2 years ago resulting in deformity  Patient is on Remicade infusions same known rheumatoid arthritic. She's had bilateral foot surgery and now is concerned about excessive rotatory motion in her left foot which is causing her to have some upper extremity pain when she stands    Review of Systems  All other systems reviewed and are negative.      Objective:   Physical Exam  Orientated 3  Vascular: DP and PT pulses 2/4 bilaterally Capillary fill reflex immediate bilaterally  Neurological: Ankle reflex equal and reactive bilaterally Vibratory sensation intact bilaterally  Dermatological: Multiple dorsal surgical incisions toes 1 through 5 left and one through 4 right The left hallux nail plate is brittle and lifting off the nailbed with subungual debris  Musculoskeletal: Rectus foot type First MPJ appears fused in a rectus position Toes 2-5 left and 2-4 right are rigid and straight Hammer fifth right toe Nonweightbearing there is mild rotatory motion in the feet left slightly more noticeable than right Mild atrophic fad pad MPJ bilaterally       Assessment & Plan:   Assessment: Satisfactory neurovascular status Rectus foot type surgically corrected for rheumatoid deformity in good position 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 At this time patient is on Remicade infusions and I will  not recommend any permanent nail surgery  Digital scan obtained today for accommodative foot orthotic to reduce excessive rotatory motion 3-D semirigid Full length Spenco 3 mm top cover Please add 1/8 inch full length PPT  Notify patient upon receipt of orthotic

## 2014-10-25 NOTE — Patient Instructions (Signed)
Our office will contact you when the custom foot orthotics arrive

## 2014-11-14 DIAGNOSIS — G43839 Menstrual migraine, intractable, without status migrainosus: Secondary | ICD-10-CM | POA: Diagnosis not present

## 2014-11-14 DIAGNOSIS — G43719 Chronic migraine without aura, intractable, without status migrainosus: Secondary | ICD-10-CM | POA: Diagnosis not present

## 2014-11-14 DIAGNOSIS — G43901 Migraine, unspecified, not intractable, with status migrainosus: Secondary | ICD-10-CM | POA: Diagnosis not present

## 2014-11-15 ENCOUNTER — Ambulatory Visit (INDEPENDENT_AMBULATORY_CARE_PROVIDER_SITE_OTHER): Payer: MEDICARE | Admitting: Podiatry

## 2014-11-15 ENCOUNTER — Encounter: Payer: Self-pay | Admitting: Podiatry

## 2014-11-15 DIAGNOSIS — M069 Rheumatoid arthritis, unspecified: Secondary | ICD-10-CM

## 2014-11-15 NOTE — Progress Notes (Signed)
Patient ID: Kendra Taylor, female   DOB: [birth year], 69 y.o.   MRN: [id number]  Subjective: This patient presents today for dispensing of custom foot orthotics  Objective: Satisfactory contouring foot orthotics  Assessment: Postsurgical rheumatoid arthritis    Plan: Dispensed accommodate foot orthotics 3-D semirigid Full length Spenco 3 mm top cover Add additional 1/8 inch full length PPT  Wearing instruction provided in detail  Reappoint 6 weeks

## 2014-11-15 NOTE — Patient Instructions (Signed)
Orthotic Wearing Instructions  Adjust the shoe size to accommodate the custom foot orthotic  If a larger shoe is needed take the orthotic to shoe store and wear the thickness of sock that you normally would wear.  Do not remove the shoe insole and less instructed to do so.RCT  Wear the orthotics as long as they feel comfortable. Allow your feet to adjust your wearing time.  If the orthotics become uncomfortable, remove the orthotics from the shoes and restart the next day.  Repeat this procedure daily until the orthotics are, comfortable on a continuous basis.

## 2014-12-05 DIAGNOSIS — W5501XA Bitten by cat, initial encounter: Secondary | ICD-10-CM | POA: Diagnosis not present

## 2014-12-05 DIAGNOSIS — T148 Other injury of unspecified body region: Secondary | ICD-10-CM | POA: Diagnosis not present

## 2014-12-08 DIAGNOSIS — M0589 Other rheumatoid arthritis with rheumatoid factor of multiple sites: Secondary | ICD-10-CM | POA: Diagnosis not present

## 2014-12-08 DIAGNOSIS — L089 Local infection of the skin and subcutaneous tissue, unspecified: Secondary | ICD-10-CM | POA: Diagnosis not present

## 2014-12-15 DIAGNOSIS — M0589 Other rheumatoid arthritis with rheumatoid factor of multiple sites: Secondary | ICD-10-CM | POA: Diagnosis not present

## 2014-12-27 ENCOUNTER — Ambulatory Visit (INDEPENDENT_AMBULATORY_CARE_PROVIDER_SITE_OTHER): Payer: MEDICARE | Admitting: Podiatry

## 2014-12-27 VITALS — BP 121/76 | HR 86 | Resp 12

## 2014-12-27 DIAGNOSIS — M069 Rheumatoid arthritis, unspecified: Secondary | ICD-10-CM

## 2014-12-27 NOTE — Progress Notes (Signed)
Patient ID: HADJA HARRAL, female   DOB: 04-Apr-1946, 69 y.o.   MRN: [id number]  Subjective: This patient presents for follow-up care for tolerance of custom foot orthotics dispensed on 10/25/2014. Patient is wearing custom foot orthotics on a continuous basis when she wears athletic style shoes and finds them comfortable reducing some of the generalized fatigue that she notices in her left foot. She also questions treatment for a deformed left hallux nail  Objective: Custom foot orthotics contour satisfactorily without any areas of friction or rub Deformed left hallux toenail  Assessment: Satisfactory tolerance of custom foot orthotics Mycotic left hallux toenail  Plan: Patient advised to continue wearing custom foot orthotics an ongoing continuous basis The left hallux toenail was debrided today. Patient was possibly interested in having nail removed. I made patient aware that if she is on Remicade I prefer not to do any elective surgery wire on Remicade  Reappoint at patient's request

## 2014-12-27 NOTE — Patient Instructions (Signed)
Continue to wear your custom foot orthotics in athletic lace up style shoes an ongoing continuous basis  Return as needed

## 2015-01-31 DIAGNOSIS — R5383 Other fatigue: Secondary | ICD-10-CM | POA: Diagnosis not present

## 2015-01-31 DIAGNOSIS — M0589 Other rheumatoid arthritis with rheumatoid factor of multiple sites: Secondary | ICD-10-CM | POA: Diagnosis not present

## 2015-02-07 DIAGNOSIS — M0589 Other rheumatoid arthritis with rheumatoid factor of multiple sites: Secondary | ICD-10-CM | POA: Diagnosis not present

## 2015-02-07 DIAGNOSIS — M150 Primary generalized (osteo)arthritis: Secondary | ICD-10-CM | POA: Diagnosis not present

## 2015-02-07 DIAGNOSIS — M79673 Pain in unspecified foot: Secondary | ICD-10-CM | POA: Diagnosis not present

## 2015-02-15 DIAGNOSIS — Z23 Encounter for immunization: Secondary | ICD-10-CM | POA: Diagnosis not present

## 2015-03-30 DIAGNOSIS — M0589 Other rheumatoid arthritis with rheumatoid factor of multiple sites: Secondary | ICD-10-CM | POA: Diagnosis not present

## 2015-05-10 DIAGNOSIS — L603 Nail dystrophy: Secondary | ICD-10-CM | POA: Diagnosis not present

## 2015-05-10 DIAGNOSIS — L570 Actinic keratosis: Secondary | ICD-10-CM | POA: Diagnosis not present

## 2015-05-18 DIAGNOSIS — G43839 Menstrual migraine, intractable, without status migrainosus: Secondary | ICD-10-CM | POA: Diagnosis not present

## 2015-05-18 DIAGNOSIS — G43719 Chronic migraine without aura, intractable, without status migrainosus: Secondary | ICD-10-CM | POA: Diagnosis not present

## 2015-05-18 DIAGNOSIS — G43901 Migraine, unspecified, not intractable, with status migrainosus: Secondary | ICD-10-CM | POA: Diagnosis not present

## 2015-05-22 DIAGNOSIS — M0589 Other rheumatoid arthritis with rheumatoid factor of multiple sites: Secondary | ICD-10-CM | POA: Diagnosis not present

## 2015-06-08 DIAGNOSIS — M0589 Other rheumatoid arthritis with rheumatoid factor of multiple sites: Secondary | ICD-10-CM | POA: Diagnosis not present

## 2015-06-08 DIAGNOSIS — M79673 Pain in unspecified foot: Secondary | ICD-10-CM | POA: Diagnosis not present

## 2015-06-08 DIAGNOSIS — K219 Gastro-esophageal reflux disease without esophagitis: Secondary | ICD-10-CM | POA: Diagnosis not present

## 2015-06-08 DIAGNOSIS — M150 Primary generalized (osteo)arthritis: Secondary | ICD-10-CM | POA: Diagnosis not present

## 2015-07-11 DIAGNOSIS — M0589 Other rheumatoid arthritis with rheumatoid factor of multiple sites: Secondary | ICD-10-CM | POA: Diagnosis not present

## 2015-07-27 DIAGNOSIS — R0600 Dyspnea, unspecified: Secondary | ICD-10-CM | POA: Diagnosis not present

## 2015-07-28 DIAGNOSIS — R0600 Dyspnea, unspecified: Secondary | ICD-10-CM | POA: Diagnosis not present

## 2015-07-28 DIAGNOSIS — R911 Solitary pulmonary nodule: Secondary | ICD-10-CM | POA: Diagnosis not present

## 2015-08-01 ENCOUNTER — Other Ambulatory Visit: Payer: Self-pay | Admitting: Internal Medicine

## 2015-08-01 DIAGNOSIS — R911 Solitary pulmonary nodule: Secondary | ICD-10-CM

## 2015-08-07 ENCOUNTER — Ambulatory Visit
Admission: RE | Admit: 2015-08-07 | Discharge: 2015-08-07 | Disposition: A | Discharge status: Home / Self Care | Payer: MEDICARE | Source: Ambulatory Visit | Attending: Internal Medicine | Admitting: Internal Medicine

## 2015-08-07 DIAGNOSIS — R918 Other nonspecific abnormal finding of lung field: Secondary | ICD-10-CM | POA: Diagnosis not present

## 2015-08-07 DIAGNOSIS — R911 Solitary pulmonary nodule: Secondary | ICD-10-CM

## 2015-08-18 DIAGNOSIS — L603 Nail dystrophy: Secondary | ICD-10-CM | POA: Diagnosis not present

## 2015-08-18 DIAGNOSIS — L570 Actinic keratosis: Secondary | ICD-10-CM | POA: Diagnosis not present

## 2015-08-23 ENCOUNTER — Encounter (INDEPENDENT_AMBULATORY_CARE_PROVIDER_SITE_OTHER): Payer: Self-pay

## 2015-08-23 ENCOUNTER — Encounter: Payer: Self-pay | Admitting: Internal Medicine

## 2015-08-23 ENCOUNTER — Ambulatory Visit (INDEPENDENT_AMBULATORY_CARE_PROVIDER_SITE_OTHER): Payer: MEDICARE | Admitting: Internal Medicine

## 2015-08-23 VITALS — BP 122/64 | HR 85 | Ht 63.5 in | Wt 129.0 lb

## 2015-08-23 DIAGNOSIS — R05 Cough: Secondary | ICD-10-CM

## 2015-08-23 DIAGNOSIS — J479 Bronchiectasis, uncomplicated: Secondary | ICD-10-CM

## 2015-08-23 DIAGNOSIS — R058 Other specified cough: Secondary | ICD-10-CM

## 2015-08-23 DIAGNOSIS — R059 Cough, unspecified: Secondary | ICD-10-CM | POA: Insufficient documentation

## 2015-08-23 NOTE — Patient Instructions (Signed)
Pantoprazole (protonix) 40 mg  Take  30-60 min before first meal of the day and Pepcid (famotidine)  20 mg one @  bedtime until return to office - this is the best way to tell whether stomach acid is contributing to your problem.   GERD (REFLUX)  is an extremely common cause of respiratory symptoms just like yours , many times with no obvious heartburn at all.    It can be treated with medication, but also with lifestyle changes including elevation of the head of your bed (ideally with 6 inch  bed blocks),  Smoking cessation, avoidance of late meals, excessive alcohol, and avoid fatty foods, chocolate, peppermint, colas, red wine, and acidic juices such as orange juice.  NO MINT OR MENTHOL PRODUCTS SO NO COUGH DROPS  USE SUGARLESS CANDY INSTEAD (Jolley ranchers or Stover's or Life Savers) or even ice chips will also do - the key is to swallow to prevent all throat clearing. NO OIL BASED VITAMINS - use powdered substitutes.  Please schedule a follow up visit in 3 months but call sooner if needed with pfts - bring chest xrays for the last 2 years with you

## 2015-08-23 NOTE — Progress Notes (Signed)
Subjective:    Patient ID: Kendra Taylor, female    DOB: 05-30-1945,     MRN: [id number]  HPI    84 yowf  Never smoker with RA since 1960/70s   / very sedentary due to RA since 2015 but still able to walk up flight of steps and do shopping but bothered by sob speaking x fall 2016 so cxr > abn LLL > confirmed on CT chest >  referred to pulmonary clinic 08/23/2015 by Dr Shelia Media   08/23/2015 1st [facility] office visit/ Phoebe Marter   Chief Complaint  Patient presents with  . Pulmonary Consult    Referred by Dr. Deland Pretty. Pt c/o SOB for the past 6 months. She is SOB only when she reads out loud.   noted onset of voice fatigue 6 month prior to OV   indolent onset and progressive to where she can't read out loud for a minute before has to stop  Assoc with mild hoarseness but no cough or doe.   No obvious other patterns in day to day or daytime variabilty or assoc excess/ purulent sputum or mucus plugs   or cp or chest tightness, subjective wheeze overt sinus or hb symptoms. No unusual exp hx or h/o childhood pna/ asthma or knowledge of premature birth.  Sleeping ok without nocturnal  or early am exacerbation  of respiratory  c/o's or need for noct saba. Also denies any obvious fluctuation of symptoms with weather or environmental changes or other aggravating or alleviating factors except as outlined above   Current Medications, Allergies, Complete Past Medical History, Past Surgical History, Family History, and Social History were reviewed in [employer] record.      Review of Systems  Constitutional: Negative for fever, chills and unexpected weight change.  HENT: Negative for congestion, dental problem, ear pain, nosebleeds, postnasal drip, rhinorrhea, sinus pressure, sneezing, sore throat, trouble swallowing and voice change.   Eyes: Negative for visual disturbance.  Respiratory: Positive for shortness of breath. Negative for cough and choking.     Cardiovascular: Negative for chest pain and leg swelling.  Gastrointestinal: Negative for vomiting, abdominal pain and diarrhea.  Genitourinary: Negative for difficulty urinating.  Musculoskeletal: Negative for arthralgias.  Skin: Negative for rash.  Neurological: Negative for tremors, syncope and headaches.  Hematological: Does not bruise/bleed easily.       Objective:   Physical Exam  amb wf nad  Wt Readings from Last 3 Encounters:  08/23/15 129 lb (58.514 kg)  04/26/14 128 lb (58.06 kg)  03/03/14 130 lb (58.968 kg)    Vital signs reviewed   HEENT: nl dentition, turbinates, and oropharynx. Nl external ear canals without cough reflex   NECK :  without JVD/Nodes/TM/ nl carotid upstrokes bilaterally   LUNGS: no acc muscle use,  Nl contour chest which is clear to A and P bilaterally without cough on insp or exp maneuvers   CV:  RRR  no s3 or murmur or increase in P2, no edema   ABD:  soft and nontender with nl inspiratory excursion in the supine position. No bruits or organomegaly, bowel sounds nl  MS:  Nl gait/ ext warm without   calf tenderness, cyanosis or clubbing Classic RA changes with ulner deviation on L, corrected on R s/p surgery  SKIN: warm and dry without lesions    NEURO:  alert, approp, nl sensorium with  no motor deficits    I personally reviewed images and agree with radiology impression as follows:  CT Chest     1. The perceived nodular opacity on the recent chest x-ray corresponds to a mass-like area of architectural distortion in the left lower lobe. There does appear to be some bronchiectasis associated with this region, which typically favors an infectious or inflammatory etiology.     I personally reviewed images and agree with radiology impression as follows:  CXR:  07/28/15  Obvious LLL density on PA view with "NO COMPARISON STUDIES" on report            Assessment & Plan:

## 2015-08-24 NOTE — Assessment & Plan Note (Addendum)
See CT chest 08/07/15 corresponding to conspicuous density on PA CXR 07/28/15   This area is obvious on plain cxr and in the absence of pna hx is worrisome for BAC (bronchoalv ca) so rec return for new cxr in 3 months and bring the old ones.  If inflammatory from RA or BAC the PET scan loses it's specificity in the former and sensitivity in the latter so hold off on this for now   Discussed in detail all the  indications, usual  risks and alternatives relative to the benefits with patient who agrees to proceed with conservative f/u as outlined    Total time devoted to counseling  = 35/41mreview case with pt/ discussion of options/recs and personally creating in presence of pt  then going over specific  Instructions directly with the pt including how to use all of the meds but in particular covering each new medication in detail (see avs)

## 2015-08-24 NOTE — Assessment & Plan Note (Signed)
Classic voice fatigue patter suggests Upper airway cough syndrome, so named because it's frequently impossible to sort out how much is  CR/sinusitis with freq throat clearing (which can be related to primary GERD)   vs  causing  secondary (" extra esophageal")  GERD from wide swings in gastric pressure that occur with throat clearing, often  promoting self use of mint and menthol lozenges that reduce the lower esophageal sphincter tone and exacerbate the problem further in a cyclical fashion.   These are the same pts (now being labeled as having "irritable larynx syndrome" by some cough centers) who not infrequently have a history of having failed to tolerate ace inhibitors,  dry powder inhalers or biphosphonates or report having atypical reflux symptoms that don't respond to standard doses of PPI , and are easily confused as having aecopd or asthma flares by even experienced allergists/ pulmonologists.  .? Acid (or non-acid) GERD mediated  > always difficult to exclude as up to 75% of pts in some series report no assoc GI/ Heartburn symptoms> rec max (24h)  acid suppression and diet restrictions/ reviewed and instructions given in writing.   ? Developing CA from RA = CricoAretenoiditis with mild VCD on this basis > rec return for pfts with [facility] loop planned

## 2015-08-30 DIAGNOSIS — M0589 Other rheumatoid arthritis with rheumatoid factor of multiple sites: Secondary | ICD-10-CM | POA: Diagnosis not present

## 2015-09-22 ENCOUNTER — Encounter: Payer: Self-pay | Admitting: Internal Medicine

## 2015-10-06 DIAGNOSIS — M0589 Other rheumatoid arthritis with rheumatoid factor of multiple sites: Secondary | ICD-10-CM | POA: Diagnosis not present

## 2015-10-06 DIAGNOSIS — K219 Gastro-esophageal reflux disease without esophagitis: Secondary | ICD-10-CM | POA: Diagnosis not present

## 2015-10-06 DIAGNOSIS — M79673 Pain in unspecified foot: Secondary | ICD-10-CM | POA: Diagnosis not present

## 2015-10-06 DIAGNOSIS — M150 Primary generalized (osteo)arthritis: Secondary | ICD-10-CM | POA: Diagnosis not present

## 2015-10-17 DIAGNOSIS — M0589 Other rheumatoid arthritis with rheumatoid factor of multiple sites: Secondary | ICD-10-CM | POA: Diagnosis not present

## 2015-11-23 ENCOUNTER — Encounter: Payer: Self-pay | Admitting: Adult Health

## 2015-11-23 ENCOUNTER — Ambulatory Visit: Payer: MEDICARE | Admitting: Internal Medicine

## 2015-11-23 ENCOUNTER — Encounter (INDEPENDENT_AMBULATORY_CARE_PROVIDER_SITE_OTHER): Payer: MEDICARE | Admitting: Internal Medicine

## 2015-11-23 ENCOUNTER — Ambulatory Visit (INDEPENDENT_AMBULATORY_CARE_PROVIDER_SITE_OTHER)
Admission: RE | Admit: 2015-11-23 | Discharge: 2015-11-23 | Disposition: A | Discharge status: Home / Self Care | Payer: MEDICARE | Source: Ambulatory Visit | Attending: Adult Health | Admitting: Adult Health

## 2015-11-23 ENCOUNTER — Ambulatory Visit (INDEPENDENT_AMBULATORY_CARE_PROVIDER_SITE_OTHER): Payer: MEDICARE | Admitting: Adult Health

## 2015-11-23 VITALS — BP 122/78 | HR 75 | Temp 97.6°F | Ht 62.0 in | Wt 129.0 lb

## 2015-11-23 DIAGNOSIS — R058 Other specified cough: Secondary | ICD-10-CM

## 2015-11-23 DIAGNOSIS — R05 Cough: Secondary | ICD-10-CM | POA: Diagnosis not present

## 2015-11-23 DIAGNOSIS — R0602 Shortness of breath: Secondary | ICD-10-CM | POA: Diagnosis not present

## 2015-11-23 DIAGNOSIS — R059 Cough, unspecified: Secondary | ICD-10-CM

## 2015-11-23 DIAGNOSIS — J479 Bronchiectasis, uncomplicated: Secondary | ICD-10-CM | POA: Diagnosis not present

## 2015-11-23 LAB — PULMONARY FUNCTION TEST
DL/VA % pred: 83 %
DL/VA: 3.86 ml/min/mmHg/L
DLCO cor % pred: 79 %
DLCO cor: 18.04 ml/min/mmHg
DLCO unc % pred: 82 %
DLCO unc: 18.57 ml/min/mmHg
FEF 25-75 Post: 2.49 L/sec
FEF 25-75 Pre: 2.81 L/sec
FEF2575-%Change-Post: -11 %
FEF2575-%Pred-Post: 145 %
FEF2575-%Pred-Pre: 164 %
FEV1-%Change-Post: -2 %
FEV1-%Pred-Post: 122 %
FEV1-%Pred-Pre: 126 %
FEV1-Post: 2.54 L
FEV1-Pre: 2.62 L
FEV1FVC-%Change-Post: 0 %
FEV1FVC-%Pred-Pre: 110 %
FEV6-%Change-Post: -3 %
FEV6-%Pred-Post: 115 %
FEV6-%Pred-Pre: 120 %
FEV6-Post: 3.05 L
FEV6-Pre: 3.17 L
FEV6FVC-%Pred-Post: 105 %
FEV6FVC-%Pred-Pre: 105 %
FVC-%Change-Post: -3 %
FVC-%Pred-Post: 110 %
FVC-%Pred-Pre: 114 %
FVC-Post: 3.05 L
FVC-Pre: 3.17 L
Post FEV1/FVC ratio: 83 %
Post FEV6/FVC ratio: 100 %
Pre FEV1/FVC ratio: 83 %
Pre FEV6/FVC Ratio: 100 %

## 2015-11-23 NOTE — Assessment & Plan Note (Signed)
Upper airway irritation is some better with trigger control of possible GERD .  Add saline nasal rinses As needed   PFT is normal with no restriction or airflow obstrruction.   Plan  Saline nasal rinses As needed   Cont GERD tx .

## 2015-11-23 NOTE — Assessment & Plan Note (Signed)
LLL density ? Bronchiectasis  Will follow CXR today , may need follow up CT chest for direct comparison for possible growth,  If growth, consider PET scan.

## 2015-11-23 NOTE — Progress Notes (Signed)
Subjective:    Patient ID: Kendra Taylor, female    DOB: 1946/03/23,     MRN: [id number]  HPI    35 yowf  Never smoker with RA since 1960/70s   / very sedentary due to RA since 2015 but still able to walk up flight of steps and do shopping but bothered by sob speaking x fall 2016 so cxr > abn LLL > confirmed on CT chest >  referred to pulmonary clinic 08/23/2015 by Dr Shelia Media   08/23/2015 1st [facility] office visit/ W[facility]   Chief Complaint  Patient presents with  . Pulmonary Consult    Referred by Dr. Deland Pretty. Pt c/o SOB for the past 6 months. She is SOB only when she reads out loud.   noted onset of voice fatigue 6 month prior to OV   indolent onset and progressive to where she can't read out loud for a minute before has to stop  Assoc with mild hoarseness but no cough or doe. >>PPI/Pepcid rx   11/23/2015 Follow up : Dyspnea/Abn CT chest  Pt returns for 3 month follow up . Seen last ov for pulmonary consult for dyspnea and abnormal CT chest with LLL opacity .   SHe was started on PPI/Pepcid for upper airway irritation w/ hoarseness, voice fatigue and throat clearing .  Pt has RA hx on remicaid.  PFT today shows normal lung function with FEV1 122%, ratio 83, FVC 110%, DLCO 82%. . No BD response.  CT chest 07/2015 showed LLL mass like area of distortion w/ areas of bronchiectasis in this area.  Does feel she some better since last ov. Not as winded and voice fatigue is some better.  Gets winded with prolonged speaking and reading aloud.  Leads sedentary lifestyle.  She denies chest pain, hemoptysis , weight loss, cough or fever.     Current Medications, Allergies, Complete Past Medical History, Past Surgical History, Family History, and Social History were reviewed in [employer] record.      Review of Systems  Constitutional: Negative for fever, chills and unexpected weight change.  HENT: Negative for congestion, dental problem, ear pain,  nosebleeds, postnasal drip, rhinorrhea, sinus pressure, sneezing, sore throat, trouble swallowing and voice change.   Eyes: Negative for visual disturbance.  Respiratory: Positive for shortness of breath. Negative for cough and choking.   Cardiovascular: Negative for chest pain and leg swelling.  Gastrointestinal: Negative for vomiting, abdominal pain and diarrhea.  Genitourinary: Negative for difficulty urinating.  Musculoskeletal: Negative for arthralgias.  Skin: Negative for rash.  Neurological: Negative for tremors, syncope and headaches.  Hematological: Does not bruise/bleed easily.       Objective:   Physical Exam  amb wf nad Vitals:   11/23/15 1128  BP: 122/78  Pulse: 75  Temp: 97.6 F (36.4 C)  TempSrc: Oral  SpO2: 96%  Weight: 129 lb (58.5 kg)  Height: '5\' 2"'$  (1.575 m)      Vital signs reviewed   HEENT: nl dentition, turbinates, and oropharynx. Nl external ear canals without cough reflex   NECK :  without JVD/Nodes/TM/ nl carotid upstrokes bilaterally   LUNGS: no acc muscle use,  Nl contour chest which is clear to A and P bilaterally without cough on insp or exp maneuvers   CV:  RRR  no s3 or murmur or increase in P2, no edema   ABD:  soft and nontender with nl inspiratory excursion in the supine position. No bruits or organomegaly, bowel  sounds nl  MS:  Nl gait/ ext warm without   calf tenderness, cyanosis or clubbing Classic RA changes with ulner deviation on L, corrected on R s/p surgery  SKIN: warm and dry without lesions    NEURO:  alert, approp, nl sensorium with  no motor deficits     CT Chest     1. The perceived nodular opacity on the recent chest x-ray corresponds to a mass-like area of architectural distortion in the left lower lobe. There does appear to be some bronchiectasis associated with this region, which typically favors an infectious or inflammatory etiology.      CXR:  07/28/15  Obvious LLL density on PA view with "NO  COMPARISON STUDIES" on report      Gricel Copen NP-C  [facility]  11/23/2015

## 2015-11-23 NOTE — Patient Instructions (Signed)
Chest xray today  Continue on current regimen follow up Dr. Melvyn Novas  In 3-4 months and As needed

## 2015-12-04 NOTE — Progress Notes (Signed)
Called spoke with patient, advised of cxr results / recs as stated by TP.  Pt verbalized her understanding and denied any questions. 

## 2015-12-04 NOTE — Progress Notes (Signed)
Appt scheduled with MW for 8.28.17 @ 12N.  Appt card mailed to pt's verified home address.

## 2015-12-05 DIAGNOSIS — M0589 Other rheumatoid arthritis with rheumatoid factor of multiple sites: Secondary | ICD-10-CM | POA: Diagnosis not present

## 2015-12-12 ENCOUNTER — Telehealth: Payer: Self-pay | Admitting: Internal Medicine

## 2015-12-12 DIAGNOSIS — J479 Bronchiectasis, uncomplicated: Secondary | ICD-10-CM

## 2015-12-12 DIAGNOSIS — R05 Cough: Secondary | ICD-10-CM

## 2015-12-12 DIAGNOSIS — R059 Cough, unspecified: Secondary | ICD-10-CM

## 2015-12-12 NOTE — Telephone Encounter (Signed)
Called and spoke with pt.  She stated that she was seen by TP on 7/27 and cxr was done at that visit.  Was called by out office and advised the MW wants to see her in 4 wks to discuss these results further and possibly have her repeat the ct chest.  Pt stated that she would like to have the CT done prior to her appt with MW on 8/28 at 12.  Please advise if ok to schedule this or do you want to wait till after the appt with you?  Please advise. thanks

## 2015-12-13 NOTE — Telephone Encounter (Signed)
Patient scheduled for 12/19/15 '@10'$ :30 am [facility].  Pt aware of appt & location info.

## 2015-12-13 NOTE — Telephone Encounter (Signed)
Ok to do CT s contrast

## 2015-12-13 NOTE — Telephone Encounter (Signed)
CT has been ordered. Please advise PCC's thanks

## 2015-12-13 NOTE — Telephone Encounter (Signed)
Pt is scheduled for CT.  Nothing further needed.

## 2015-12-19 ENCOUNTER — Encounter (INDEPENDENT_AMBULATORY_CARE_PROVIDER_SITE_OTHER): Payer: Self-pay

## 2015-12-19 ENCOUNTER — Ambulatory Visit (INDEPENDENT_AMBULATORY_CARE_PROVIDER_SITE_OTHER)
Admission: RE | Admit: 2015-12-19 | Discharge: 2015-12-19 | Disposition: A | Discharge status: Home / Self Care | Payer: MEDICARE | Source: Ambulatory Visit | Attending: Internal Medicine | Admitting: Internal Medicine

## 2015-12-19 DIAGNOSIS — R918 Other nonspecific abnormal finding of lung field: Secondary | ICD-10-CM | POA: Diagnosis not present

## 2015-12-19 DIAGNOSIS — J479 Bronchiectasis, uncomplicated: Secondary | ICD-10-CM | POA: Diagnosis not present

## 2015-12-19 DIAGNOSIS — R05 Cough: Secondary | ICD-10-CM | POA: Diagnosis not present

## 2015-12-19 DIAGNOSIS — R059 Cough, unspecified: Secondary | ICD-10-CM

## 2015-12-25 ENCOUNTER — Ambulatory Visit: Payer: MEDICARE | Admitting: Internal Medicine

## 2015-12-29 ENCOUNTER — Other Ambulatory Visit: Payer: Self-pay

## 2016-01-23 DIAGNOSIS — M0589 Other rheumatoid arthritis with rheumatoid factor of multiple sites: Secondary | ICD-10-CM | POA: Diagnosis not present

## 2016-01-26 DIAGNOSIS — L570 Actinic keratosis: Secondary | ICD-10-CM | POA: Diagnosis not present

## 2016-02-02 ENCOUNTER — Ambulatory Visit (INDEPENDENT_AMBULATORY_CARE_PROVIDER_SITE_OTHER): Payer: MEDICARE | Admitting: Podiatry

## 2016-02-02 ENCOUNTER — Telehealth: Payer: Self-pay | Admitting: *Deleted

## 2016-02-02 ENCOUNTER — Encounter: Payer: Self-pay | Admitting: Podiatry

## 2016-02-02 DIAGNOSIS — L600 Ingrowing nail: Secondary | ICD-10-CM

## 2016-02-02 MED ORDER — TRAMADOL HCL 50 MG PO TABS: 50.0000 mg | ORAL_TABLET | Freq: Three times a day (TID) | ORAL | 0 refills | Status: DC | PRN

## 2016-02-02 NOTE — Patient Instructions (Signed)

## 2016-02-02 NOTE — Telephone Encounter (Signed)
Pt states she had a toenail procedure today with Dr. Paulla Dolly, and toe hurts but can't take Ibuprofen or Tylenol, would like something stronger. Dr. Paulla Dolly ordered Tramadol 50% #30. Informed pt of the rx and if toe is throbbing she could begin the soaks today.

## 2016-02-04 NOTE — Progress Notes (Signed)
Subjective:     Patient ID: Kendra Taylor, female   DOB: December 02, 1945, 70 y.o.   MRN: [id number]  HPI patient presents with severe trauma to the left third nail bed secondary to injury and states that she is concerned about the possibility for infection   Review of Systems     Objective:   Physical Exam Neurovascular status intact with patient noted to have edematous distal third digit left with trauma and a loose nail with slight drainage noted that's localized in nature    Assessment:     Damage left third nail with trauma as a problem    Plan:     H&P condition reviewed and recommended nail removal. Explained procedure and risk and today I infiltrated the left third toe 60 mg like Marcaine mixture remove the third nail flush the area out applied sterile dressing and instructed on soaks. If any redness or drainage were to occur let us know immediately

## 2016-02-07 DIAGNOSIS — D487 Neoplasm of uncertain behavior of other specified sites: Secondary | ICD-10-CM | POA: Diagnosis not present

## 2016-02-07 DIAGNOSIS — Z23 Encounter for immunization: Secondary | ICD-10-CM | POA: Diagnosis not present

## 2016-02-23 ENCOUNTER — Ambulatory Visit (INDEPENDENT_AMBULATORY_CARE_PROVIDER_SITE_OTHER): Payer: MEDICARE | Admitting: Internal Medicine

## 2016-02-23 ENCOUNTER — Encounter: Payer: Self-pay | Admitting: Internal Medicine

## 2016-02-23 ENCOUNTER — Ambulatory Visit (INDEPENDENT_AMBULATORY_CARE_PROVIDER_SITE_OTHER)
Admission: RE | Admit: 2016-02-23 | Discharge: 2016-02-23 | Disposition: A | Discharge status: Home / Self Care | Payer: MEDICARE | Source: Ambulatory Visit | Attending: Internal Medicine | Admitting: Internal Medicine

## 2016-02-23 VITALS — BP 102/60 | HR 86 | Ht 62.0 in | Wt 131.6 lb

## 2016-02-23 DIAGNOSIS — R058 Other specified cough: Secondary | ICD-10-CM

## 2016-02-23 DIAGNOSIS — R05 Cough: Secondary | ICD-10-CM | POA: Diagnosis not present

## 2016-02-23 DIAGNOSIS — J479 Bronchiectasis, uncomplicated: Secondary | ICD-10-CM

## 2016-02-23 DIAGNOSIS — R918 Other nonspecific abnormal finding of lung field: Secondary | ICD-10-CM | POA: Diagnosis not present

## 2016-02-23 NOTE — Progress Notes (Signed)
Subjective:    Patient ID: Kendra Taylor, female    DOB: 1945-09-29,     MRN: [id number]     Brief patient profile:  70 yowf  Never smoker with RA since 1960/70s   / very sedentary due to RA since 2015 but still able to walk up flight of steps and do shopping but bothered by sob speaking x fall 2016 so cxr > abn LLL > confirmed on CT chest >  referred to pulmonary clinic 08/23/2015 by Dr Shelia Media    History of Present Illness  08/23/2015 1st [facility] office visit/ Kendra Taylor   Chief Complaint  Patient presents with  . Pulmonary Consult    Referred by Dr. Deland Pretty. Pt c/o SOB for the past 6 months. She is SOB only when she reads out loud.   noted onset of voice fatigue 6 month prior to OV   indolent onset and progressive to where she can't read out loud for a minute before has to stop  Assoc with mild hoarseness but no cough or doe. >>PPI/Pepcid rx/ gerd diet      11/23/2015 NP Follow up : Dyspnea/Abn CT chest  Pt returns for 3 month follow up . Seen last ov for pulmonary consult for dyspnea and abnormal CT chest with LLL opacity .   SHe was started on PPI/Pepcid for upper airway irritation w/ hoarseness, voice fatigue and throat clearing .  Pt has RA hx on remicaid.  PFT today shows normal lung function with FEV1 122%, ratio 83, FVC 110%, DLCO 82%. . No BD response.  CT chest 07/2015 showed LLL mass like area of distortion w/ areas of bronchiectasis in this area.  Does feel she some better since last ov. Not as winded and voice fatigue is some better.  Gets winded with prolonged speaking and reading aloud.  Leads sedentary lifestyle.  rec Chest xray today  Continue on current regimen   02/23/2016  f/u ov/Kendra Taylor re: abnormal cxr Chief Complaint  Patient presents with  . Follow-up    Breathing has improved, but not back to baseline. No new co's today.   intermittent pnd daytime, has not tried otc's yet/ does not wake her from sleep   Not limited by breathing from  desired activities    No obvious day to day or daytime variability or assoc excess/ purulent sputum or mucus plugs or hemoptysis or cp or chest tightness, subjective wheeze or overt sinus or hb symptoms. No unusual exp hx or h/o childhood pna/ asthma or knowledge of premature birth.  Sleeping ok without nocturnal  or early am exacerbation  of respiratory  c/o's or need for noct saba. Also denies any obvious fluctuation of symptoms with weather or environmental changes or other aggravating or alleviating factors except as outlined above   Current Medications, Allergies, Complete Past Medical History, Past Surgical History, Family History, and Social History were reviewed in [employer] record.  ROS  The following are not active complaints unless bolded sore throat, dysphagia, dental problems, itching, sneezing,  nasal congestion or excess/ purulent secretions, ear ache,   fever, chills, sweats, unintended wt loss, classically pleuritic or exertional cp,  orthopnea pnd or leg swelling, presyncope, palpitations, abdominal pain, anorexia, nausea, vomiting, diarrhea  or change in bowel or bladder habits, change in stools or urine, dysuria,hematuria,  rash, arthralgias, visual complaints, headache, numbness, weakness or ataxia or problems with walking or coordination,  change in mood/affect or memory.  Objective:   Physical Exam  amb wf nad / occ clear throat clearing   Wt Readings from Last 3 Encounters:  02/23/16 131 lb 9.6 oz (59.7 kg)  11/23/15 129 lb (58.5 kg)  08/23/15 129 lb (58.5 kg)    Vital signs reviewed       HEENT: nl dentition, turbinates, and oropharynx. Nl external ear canals without cough reflex   NECK :  without JVD/Nodes/TM/ nl carotid upstrokes bilaterally   LUNGS: no acc muscle use,  Nl contour chest which is clear to A and P bilaterally without cough on insp or exp maneuvers   CV:  RRR  no s3 or murmur or increase  in P2, no edema   ABD:  soft and nontender with nl inspiratory excursion in the supine position. No bruits or organomegaly, bowel sounds nl  MS:  Nl gait/ ext warm without   calf tenderness, cyanosis or clubbing Classic RA changes with ulner deviation on L, corrected on R s/p surgery  SKIN: warm and dry without lesions    NEURO:  alert, approp, nl sensorium with  no motor deficits        CXR:  07/28/15  Obvious LLL density on PA view with "NO COMPARISON STUDIES" on report     I personally reviewed images and agree with radiology impression as follows:  CT  Chest    12/19/15  No appreciable interval change in the irregular masslike area of architectural distortion left lower lobe. Given the lack of resolution, active inspection/inflammatory etiology becomes less likely. Chronic post infectious/inflammatory scarring could have this appearance although neoplasm still cannot be excluded.      CXR PA and Lateral:   02/23/2016 :    I personally reviewed images and agree with radiology impression as follows:   No acute infiltrate or pulmonary edema. Persistent irregular nodular density in left lower lobe posteriorly measures about 2.3 cm. My impression:  By plain cxr apples to apples from 07/18/2015 this lesion is less consipicuous on pa and lat views

## 2016-02-23 NOTE — Patient Instructions (Addendum)
For drainage >  Best option for you =  clariton or allegra     Please remember to go to the  x-ray department downstairs for your tests - we will call you with the results when they are available.    Please schedule a follow up visit in 3 months but call sooner if needed with cxr on return

## 2016-02-23 NOTE — Progress Notes (Signed)
Spoke with pt and notified of results per Dr. Wert. Pt verbalized understanding and denied any questions. 

## 2016-02-25 NOTE — Assessment & Plan Note (Signed)
rec trial of gerd rx 08/23/2015 >>> - PFTs 11/23/15 wnl  Improved but still some sense of pnds day > noct so try non-sedating antihistamines

## 2016-02-25 NOTE — Assessment & Plan Note (Signed)
See CT chest 08/07/15 corresponding to conspicuous density on PA CXR 07/28/15  - - PFTs 11/23/15 wnl - CT 12/19/15 No appreciable interval change in the irregular masslike area of architectural distortion left lower lobe> never smoker / Pos RA so rec f/u ov w/in  3 months to review options - cxr 02/23/2016 less prominent density on both pa and lat views vs 07/18/15   I had an extended discussion with the patient reviewing all relevant studies completed to date and  lasting 15 to 20 minutes of a 25 minute visit re:   She is certain this same area was abn x "years" but unable to identify / obtain previous cxr's and also unwilling to undergo any form of bx at this point.  Since she is a never smoker with RA and bronchiectasis assoc with these changes the likelihood is quite high that this is benign but will be very hard to establish and a PET scan is less likely to be accurate in this setting  Discussed in detail all the  indications, usual  risks and alternatives  relative to the benefits with patient who agrees to proceed with conservative f/u as outlined        Each maintenance medication was reviewed in detail including most importantly the difference between maintenance and prns and under what circumstances the prns are to be triggered using an action plan format that is not reflected in the computer generated alphabetically organized AVS.    Please see instructions for details which were reviewed in writing and the patient given a copy highlighting the part that I personally wrote and discussed at today's ov.

## 2016-02-26 DIAGNOSIS — J3489 Other specified disorders of nose and nasal sinuses: Secondary | ICD-10-CM | POA: Diagnosis not present

## 2016-02-26 DIAGNOSIS — K219 Gastro-esophageal reflux disease without esophagitis: Secondary | ICD-10-CM | POA: Diagnosis not present

## 2016-03-13 DIAGNOSIS — M0589 Other rheumatoid arthritis with rheumatoid factor of multiple sites: Secondary | ICD-10-CM | POA: Diagnosis not present

## 2016-04-30 DIAGNOSIS — M0589 Other rheumatoid arthritis with rheumatoid factor of multiple sites: Secondary | ICD-10-CM | POA: Diagnosis not present

## 2016-05-01 DIAGNOSIS — Z79899 Other long term (current) drug therapy: Secondary | ICD-10-CM | POA: Diagnosis not present

## 2016-05-01 DIAGNOSIS — R5383 Other fatigue: Secondary | ICD-10-CM | POA: Diagnosis not present

## 2016-05-01 DIAGNOSIS — M8580 Other specified disorders of bone density and structure, unspecified site: Secondary | ICD-10-CM | POA: Diagnosis not present

## 2016-05-01 DIAGNOSIS — K219 Gastro-esophageal reflux disease without esophagitis: Secondary | ICD-10-CM | POA: Diagnosis not present

## 2016-05-01 DIAGNOSIS — M0589 Other rheumatoid arthritis with rheumatoid factor of multiple sites: Secondary | ICD-10-CM | POA: Diagnosis not present

## 2016-05-01 DIAGNOSIS — Z0000 Encounter for general adult medical examination without abnormal findings: Secondary | ICD-10-CM | POA: Diagnosis not present

## 2016-05-06 DIAGNOSIS — Z23 Encounter for immunization: Secondary | ICD-10-CM | POA: Diagnosis not present

## 2016-05-06 DIAGNOSIS — G4733 Obstructive sleep apnea (adult) (pediatric): Secondary | ICD-10-CM | POA: Diagnosis not present

## 2016-05-06 DIAGNOSIS — J309 Allergic rhinitis, unspecified: Secondary | ICD-10-CM | POA: Diagnosis not present

## 2016-05-06 DIAGNOSIS — K219 Gastro-esophageal reflux disease without esophagitis: Secondary | ICD-10-CM | POA: Diagnosis not present

## 2016-05-06 DIAGNOSIS — N393 Stress incontinence (female) (male): Secondary | ICD-10-CM | POA: Diagnosis not present

## 2016-05-27 ENCOUNTER — Ambulatory Visit (INDEPENDENT_AMBULATORY_CARE_PROVIDER_SITE_OTHER): Payer: MEDICARE | Admitting: Internal Medicine

## 2016-05-27 ENCOUNTER — Encounter: Payer: Self-pay | Admitting: Internal Medicine

## 2016-05-27 ENCOUNTER — Ambulatory Visit (INDEPENDENT_AMBULATORY_CARE_PROVIDER_SITE_OTHER)
Admission: RE | Admit: 2016-05-27 | Discharge: 2016-05-27 | Disposition: A | Discharge status: Home / Self Care | Payer: MEDICARE | Source: Ambulatory Visit | Attending: Internal Medicine | Admitting: Internal Medicine

## 2016-05-27 VITALS — BP 102/64 | HR 88 | Ht 62.0 in | Wt 131.0 lb

## 2016-05-27 DIAGNOSIS — J479 Bronchiectasis, uncomplicated: Secondary | ICD-10-CM | POA: Diagnosis not present

## 2016-05-27 DIAGNOSIS — R05 Cough: Secondary | ICD-10-CM

## 2016-05-27 DIAGNOSIS — R058 Other specified cough: Secondary | ICD-10-CM

## 2016-05-27 MED ORDER — RANITIDINE HCL 150 MG PO TABS: 150.0000 mg | ORAL_TABLET | Freq: Two times a day (BID) | ORAL | 2 refills | Status: DC

## 2016-05-27 NOTE — Progress Notes (Signed)
Subjective:    Patient ID: Kendra Taylor, female    DOB: 1945/08/29,     MRN: [id number]     Brief patient profile:  67 yowf  Never smoker with RA since 1960/70s   / very sedentary due to RA since 2015 but still able to walk up flight of steps and do shopping but bothered by sob speaking x fall 2016 so cxr > abn LLL > confirmed on CT chest >  referred to pulmonary clinic 08/23/2015 by Dr Shelia Media    History of Present Illness  08/23/2015 1st [facility] office visit/ Kendra Taylor   Chief Complaint  Patient presents with  . Pulmonary Consult    Referred by Dr. Deland Pretty. Pt c/o SOB for the past 6 months. She is SOB only when she reads out loud.   noted onset of voice fatigue 6 month prior to OV   indolent onset and progressive to where she can't read out loud for a minute before has to stop  Assoc with mild hoarseness but no cough or doe. >>PPI/Pepcid rx/ gerd diet      11/23/2015 NP Follow up : Dyspnea/Abn CT chest  Pt returns for 3 month follow up . Seen last ov for pulmonary consult for dyspnea and abnormal CT chest with LLL opacity .   SHe was started on PPI/Pepcid for upper airway irritation w/ hoarseness, voice fatigue and throat clearing .  Pt has RA hx on remicaid.  PFT today shows normal lung function with FEV1 122%, ratio 83, FVC 110%, DLCO 82%. . No BD response.  CT chest 07/2015 showed LLL mass like area of distortion w/ areas of bronchiectasis in this area.  Does feel she some better since last ov. Not as winded and voice fatigue is some better.  Gets winded with prolonged speaking and reading aloud.  Leads sedentary lifestyle.  rec Chest xray today  Continue on current regimen   02/23/2016  f/u ov/Kendra Taylor re: abnormal cxr Chief Complaint  Patient presents with  . Follow-up    Breathing has improved, but not back to baseline. No new co's today.   intermittent pnd daytime, has not tried otc's yet/ does not wake her from sleep  Not limited by breathing from desired  activities  No change rx / f/u cxr in 3 m    05/27/2016  f/u ov/Kendra Taylor re: uacs/ abn cxr c/w bronchiectasis  Chief Complaint  Patient presents with  . Follow-up    Pt states her SOB has improved since last OV. Pt states her acid reflux has also improved along with her cough. Pt states overall she feels she is doing well. Pt states she is going to try to go back to using her CPAP.   RA ok / no am cough / congestion     No obvious day to day or daytime variability or assoc excess/ purulent sputum or mucus plugs or hemoptysis or cp or chest tightness, subjective wheeze or overt sinus or hb symptoms. No unusual exp hx or h/o childhood pna/ asthma or knowledge of premature birth.  Sleeping ok without nocturnal  or early am exacerbation  of respiratory  c/o's or need for noct saba. Also denies any obvious fluctuation of symptoms with weather or environmental changes or other aggravating or alleviating factors except as outlined above   Current Medications, Allergies, Complete Past Medical History, Past Surgical History, Family History, and Social History were reviewed in [employer] record.  ROS  The following are  not active complaints unless bolded sore throat, dysphagia, dental problems, itching, sneezing,  nasal congestion or excess/ purulent secretions, ear ache,   fever, chills, sweats, unintended wt loss, classically pleuritic or exertional cp,  orthopnea pnd or leg swelling, presyncope, palpitations, abdominal pain, anorexia, nausea, vomiting, diarrhea  or change in bowel or bladder habits, change in stools or urine, dysuria,hematuria,  rash, arthralgias, visual complaints, headache, numbness, weakness or ataxia or problems with walking or coordination,  change in mood/affect or memory.                     Objective:   Physical Exam  amb wf nad    05/27/2016        131  02/23/16 131 lb 9.6 oz (59.7 kg)  11/23/15 129 lb (58.5 kg)  08/23/15 129 lb (58.5 kg)      Vital signs reviewed - Note on arrival 02 sats  97% on RA       HEENT: nl dentition, turbinates, and oropharynx. Nl external ear canals without cough reflex   NECK :  without JVD/Nodes/TM/ nl carotid upstrokes bilaterally   LUNGS: no acc muscle use,  Nl contour chest which is clear to A and P bilaterally without cough on insp or exp maneuvers   CV:  RRR  no s3 or murmur or increase in P2, no edema   ABD:  soft and nontender with nl inspiratory excursion in the supine position. No bruits or organomegaly, bowel sounds nl  MS:  Nl gait/ ext warm without   calf tenderness, cyanosis or clubbing Classic RA changes with ulner deviation on L, corrected on R s/p surgery  SKIN: warm and dry without lesions    NEURO:  alert, approp, nl sensorium with  no motor deficits         CXR PA and Lateral:   05/27/2016 :    I personally reviewed images and agree with radiology impression as follows:    No detected change in the left lower lobe nodule as described on 12/19/2015 chest CT. No new abnormality.

## 2016-05-27 NOTE — Patient Instructions (Addendum)
Stop protonix and take zantac 150 mg twice daily - after breakfast and after supper x 2 weeks then just after supper x 2 weeks then off and if coughing recurs you need to see a GI specialist   Please remember to go to the  x-ray department downstairs in the basement  for your tests - we will call you with the results when they are available.   Please schedule a follow up visit in 6  months but call sooner if needed

## 2016-05-28 ENCOUNTER — Telehealth: Payer: Self-pay | Admitting: Internal Medicine

## 2016-05-28 NOTE — Telephone Encounter (Signed)
Call pt: Reviewed cxr and no acute change so no change in recommendations made at ov  Spoke with pt and notified of results per Dr. Wert. Pt verbalized understanding and denied any questions.  

## 2016-05-28 NOTE — Progress Notes (Signed)
LMTCB

## 2016-05-28 NOTE — Progress Notes (Signed)
Spoke with pt and notified of results per Dr. Wert. Pt verbalized understanding and denied any questions. 

## 2016-06-02 NOTE — Assessment & Plan Note (Signed)
Ok to taper off gerd rx and if cough flares rec GI eval for longterm gerd rx   Discussed the recent press about ppi's in the context of a statistically significant (but questionably clinically relevant) increase in CRI in pts on ppi vs h2's > bottom line is the lowest dose of ppi that controls   gerd is the right dose and if that dose is zero that's fine esp since h2's are cheaper anyway but if is going to need longterm PPI in this setting would like GI's input

## 2016-06-02 NOTE — Assessment & Plan Note (Signed)
See CT chest 08/07/15 corresponding to conspicuous density on PA CXR 07/28/15  - - PFTs 11/23/15 wnl - CT 12/19/15 No appreciable interval change in the irregular masslike area of architectural distortion left lower lobe> never smoker / Pos RA so rec f/u ov w/in  3 months to review options - cxr 02/23/2016 less prominent density on both pa and lat views vs 07/18/15 > no change 05/27/2016   Process is most likely benign - Discussed in detail all the  indications, usual  risks and alternatives  relative to the benefits with patient who agrees to proceed with conservative f/u q 6 m unless causing symptoms

## 2016-06-10 DIAGNOSIS — J3489 Other specified disorders of nose and nasal sinuses: Secondary | ICD-10-CM | POA: Diagnosis not present

## 2016-06-18 DIAGNOSIS — M0589 Other rheumatoid arthritis with rheumatoid factor of multiple sites: Secondary | ICD-10-CM | POA: Diagnosis not present

## 2016-06-28 DIAGNOSIS — R21 Rash and other nonspecific skin eruption: Secondary | ICD-10-CM | POA: Diagnosis not present

## 2016-06-28 DIAGNOSIS — Z79899 Other long term (current) drug therapy: Secondary | ICD-10-CM | POA: Diagnosis not present

## 2016-06-28 DIAGNOSIS — K219 Gastro-esophageal reflux disease without esophagitis: Secondary | ICD-10-CM | POA: Diagnosis not present

## 2016-07-10 ENCOUNTER — Other Ambulatory Visit: Payer: Self-pay | Admitting: Physician Assistant

## 2016-07-10 DIAGNOSIS — L309 Dermatitis, unspecified: Secondary | ICD-10-CM | POA: Diagnosis not present

## 2016-07-10 DIAGNOSIS — L500 Allergic urticaria: Secondary | ICD-10-CM | POA: Diagnosis not present

## 2016-07-10 DIAGNOSIS — D492 Neoplasm of unspecified behavior of bone, soft tissue, and skin: Secondary | ICD-10-CM | POA: Diagnosis not present

## 2016-07-19 DIAGNOSIS — Z4802 Encounter for removal of sutures: Secondary | ICD-10-CM | POA: Diagnosis not present

## 2016-07-22 DIAGNOSIS — Z4802 Encounter for removal of sutures: Secondary | ICD-10-CM | POA: Diagnosis not present

## 2016-07-30 DIAGNOSIS — Z79899 Other long term (current) drug therapy: Secondary | ICD-10-CM | POA: Diagnosis not present

## 2016-07-30 DIAGNOSIS — R21 Rash and other nonspecific skin eruption: Secondary | ICD-10-CM | POA: Diagnosis not present

## 2016-07-30 DIAGNOSIS — K219 Gastro-esophageal reflux disease without esophagitis: Secondary | ICD-10-CM | POA: Diagnosis not present

## 2016-07-30 DIAGNOSIS — M069 Rheumatoid arthritis, unspecified: Secondary | ICD-10-CM | POA: Diagnosis not present

## 2016-08-06 DIAGNOSIS — M0589 Other rheumatoid arthritis with rheumatoid factor of multiple sites: Secondary | ICD-10-CM | POA: Diagnosis not present

## 2016-09-18 DIAGNOSIS — R21 Rash and other nonspecific skin eruption: Secondary | ICD-10-CM | POA: Diagnosis not present

## 2016-09-18 DIAGNOSIS — M25579 Pain in unspecified ankle and joints of unspecified foot: Secondary | ICD-10-CM | POA: Diagnosis not present

## 2016-09-18 DIAGNOSIS — K219 Gastro-esophageal reflux disease without esophagitis: Secondary | ICD-10-CM | POA: Diagnosis not present

## 2016-09-18 DIAGNOSIS — M069 Rheumatoid arthritis, unspecified: Secondary | ICD-10-CM | POA: Diagnosis not present

## 2016-09-18 DIAGNOSIS — M8580 Other specified disorders of bone density and structure, unspecified site: Secondary | ICD-10-CM | POA: Diagnosis not present

## 2016-09-18 DIAGNOSIS — R2241 Localized swelling, mass and lump, right lower limb: Secondary | ICD-10-CM | POA: Diagnosis not present

## 2016-09-18 DIAGNOSIS — Z79899 Other long term (current) drug therapy: Secondary | ICD-10-CM | POA: Diagnosis not present

## 2016-09-24 DIAGNOSIS — Z79899 Other long term (current) drug therapy: Secondary | ICD-10-CM | POA: Diagnosis not present

## 2016-09-24 DIAGNOSIS — M0589 Other rheumatoid arthritis with rheumatoid factor of multiple sites: Secondary | ICD-10-CM | POA: Diagnosis not present

## 2016-10-31 DIAGNOSIS — L519 Erythema multiforme, unspecified: Secondary | ICD-10-CM | POA: Diagnosis not present

## 2016-10-31 DIAGNOSIS — L255 Unspecified contact dermatitis due to plants, except food: Secondary | ICD-10-CM | POA: Diagnosis not present

## 2016-11-07 DIAGNOSIS — L509 Urticaria, unspecified: Secondary | ICD-10-CM | POA: Diagnosis not present

## 2016-11-12 DIAGNOSIS — M0589 Other rheumatoid arthritis with rheumatoid factor of multiple sites: Secondary | ICD-10-CM | POA: Diagnosis not present

## 2016-12-12 ENCOUNTER — Encounter: Payer: Self-pay | Admitting: Internal Medicine

## 2016-12-12 ENCOUNTER — Ambulatory Visit (INDEPENDENT_AMBULATORY_CARE_PROVIDER_SITE_OTHER): Payer: MEDICARE | Admitting: Internal Medicine

## 2016-12-12 ENCOUNTER — Ambulatory Visit (INDEPENDENT_AMBULATORY_CARE_PROVIDER_SITE_OTHER)
Admission: RE | Admit: 2016-12-12 | Discharge: 2016-12-12 | Disposition: A | Discharge status: Home / Self Care | Payer: MEDICARE | Source: Ambulatory Visit | Attending: Internal Medicine | Admitting: Internal Medicine

## 2016-12-12 VITALS — BP 122/74 | HR 101 | Ht 62.0 in | Wt 132.4 lb

## 2016-12-12 DIAGNOSIS — R05 Cough: Secondary | ICD-10-CM

## 2016-12-12 DIAGNOSIS — R918 Other nonspecific abnormal finding of lung field: Secondary | ICD-10-CM | POA: Diagnosis not present

## 2016-12-12 DIAGNOSIS — J479 Bronchiectasis, uncomplicated: Secondary | ICD-10-CM

## 2016-12-12 DIAGNOSIS — R058 Other specified cough: Secondary | ICD-10-CM

## 2016-12-12 NOTE — Patient Instructions (Addendum)
Please remember to go to the  x-ray department downstairs in the basement  for your tests - we will call you with the results when they are available.       rec FOB :  Scheduled for 12/18/16

## 2016-12-12 NOTE — Progress Notes (Signed)
Subjective:    Patient ID: Kendra Taylor, female    DOB: 1945/12/11,     MRN: [id number]     Brief patient profile:  9 yowf  Never smoker with RA since 1960/70s   / very sedentary due to RA since 2015 but still able to walk up flight of steps and do shopping but bothered by sob speaking x fall 2016 so cxr > abn LLL > confirmed on CT chest >  referred to pulmonary clinic 08/23/2015 by Dr Shelia Media    History of Present Illness  08/23/2015 1st [facility] office visit/ Kendra Taylor   Chief Complaint  Patient presents with  . Pulmonary Consult    Referred by Dr. Deland Pretty. Pt c/o SOB for the past 6 months. She is SOB only when she reads out loud.   noted onset of voice fatigue 6 month prior to OV   indolent onset and progressive to where she can't read out loud for a minute before has to stop  Assoc with mild hoarseness but no cough or doe. >>PPI/Pepcid rx/ gerd diet      11/23/2015 NP Follow up : Dyspnea/Abn CT chest  Pt returns for 3 month follow up . Seen last ov for pulmonary consult for dyspnea and abnormal CT chest with LLL opacity .   SHe was started on PPI/Pepcid for upper airway irritation w/ hoarseness, voice fatigue and throat clearing .  Pt has RA hx on remicaid.  PFT today shows normal lung function with FEV1 122%, ratio 83, FVC 110%, DLCO 82%. . No BD response.  CT chest 07/2015 showed LLL mass like area of distortion w/ areas of bronchiectasis in this area.  Does feel she some better since last ov. Not as winded and voice fatigue is some better.  Gets winded with prolonged speaking and reading aloud.  Leads sedentary lifestyle.  rec Chest xray today  Continue on current regimen   02/23/2016  f/u ov/Kendra Taylor re: abnormal cxr Chief Complaint  Patient presents with  . Follow-up    Breathing has improved, but not back to baseline. No new co's today.   intermittent pnd daytime, has not tried otc's yet/ does not wake her from sleep  Not limited by breathing from desired  activities  No change rx / f/u cxr in 3 m    05/27/2016  f/u ov/Kendra Taylor re: uacs/ abn cxr c/w bronchiectasis  Chief Complaint  Patient presents with  . Follow-up    Pt states her SOB has improved since last OV. Pt states her acid reflux has also improved along with her cough. Pt states overall she feels she is doing well. Pt states she is going to try to go back to using her CPAP.   RA ok / no am cough / congestion   rec Stop protonix and take zantac 150 mg twice daily - after breakfast and after supper x 2 weeks then just after supper x 2 weeks then off and if coughing recurs you need to see a GI specialist    12/12/2016  f/u ov/Kendra Taylor re: f/u bronchiectasis / RA pt never smoker Chief Complaint  Patient presents with  . Follow-up    pt states she is doing well today.    developed acid hb p stopped ppi s zantac  So resumed ppi (did not follow instructions re using zantac during w/d from ppi)   Not limited by breathing from desired activities    No obvious day to day or daytime variability or  assoc excess/ purulent sputum or mucus plugs or hemoptysis or cp or chest tightness, subjective wheeze or overt sinus or hb symptoms. No unusual exp hx or h/o childhood pna/ asthma or knowledge of premature birth.  Sleeping ok without nocturnal  or early am exacerbation  of respiratory  c/o's or need for noct saba. Also denies any obvious fluctuation of symptoms with weather or environmental changes or other aggravating or alleviating factors except as outlined above   Current Medications, Allergies, Complete Past Medical History, Past Surgical History, Family History, and Social History were reviewed in [employer] record.  ROS  The following are not active complaints unless bolded sore throat, dysphagia, dental problems, itching, sneezing,  nasal congestion or excess/ purulent secretions, ear ache,   fever, chills, sweats, unintended wt loss, classically pleuritic or exertional cp,   orthopnea pnd or leg swelling, presyncope, palpitations, abdominal pain, anorexia, nausea, vomiting, diarrhea  or change in bowel or bladder habits, change in stools or urine, dysuria,hematuria,  rash, arthralgias, visual complaints, headache, numbness, weakness or ataxia or problems with walking or coordination,  change in mood/affect or memory.                   Objective:   Physical Exam  amb wf nad    12/12/2016        132  05/27/2016        131  02/23/16 131 lb 9.6 oz (59.7 kg)  11/23/15 129 lb (58.5 kg)  08/23/15 129 lb (58.5 kg)    Vital signs reviewed - Note on arrival 02 sats  97% on RA       HEENT: nl dentition, turbinates, and oropharynx. Nl external ear canals without cough reflex   NECK :  without JVD/Nodes/TM/ nl carotid upstrokes bilaterally   LUNGS: no acc muscle use,  Nl contour chest which is clear to A and P bilaterally without cough on insp or exp maneuvers   CV:  RRR  no s3 or murmur or increase in P2, no edema   ABD:  soft and nontender with nl inspiratory excursion in the supine position. No bruits or organomegaly, bowel sounds nl  MS:  Nl gait/ ext warm without   calf tenderness, cyanosis or clubbing Classic RA changes with ulner deviation on L, corrected on R s/p surgery on mcps  SKIN: warm and dry without lesions    NEURO:  alert, approp, nl sensorium with  no motor deficits       CXR PA and Lateral:   12/12/2016 :    I personally reviewed images and agree with radiology impression as follows:   Stable nodular density in the left lung base. No new focal abnormality is seen. My impression is that the infiltrate appears more dense Lateral basal segment

## 2016-12-13 NOTE — Assessment & Plan Note (Signed)
rec trial of gerd rx 08/23/2015 >>> - PFTs 11/23/15 wnl  Mild flare off ppi with over HB, ok to try to taper again off ppi but should use the zantac bid to help prevent rebound HB

## 2016-12-13 NOTE — Assessment & Plan Note (Addendum)
See CT chest 08/07/15 corresponding to conspicuous density on PA CXR 07/28/15  - - PFTs 11/23/15 wnl - CT 12/19/15 No appreciable interval change in the irregular masslike area of architectural distortion left lower lobe> never smoker / Pos RA so rec f/u ov w/in  3 months to review options - cxr 02/23/2016 less prominent density on both pa and lat views vs 07/18/15 > no change 05/27/2016 > ? More dense 12/12/2016 > rec FOB with tbbx as clearly viz on cxr vs PET - the problem with the latter is false pos in pt with bronchiectasis and RA with ddx def to include MAI in this setting.   Discussed in detail all the  indications, usual  risks and alternatives  relative to the benefits with patient who agrees to proceed with conservative f/u as outlined     I had an extended discussion with the patient reviewing all relevant studies completed to date and  lasting 15 to 20 minutes of a 25 minute visit    Each maintenance medication was reviewed in detail including most importantly the difference between maintenance and prns and under what circumstances the prns are to be triggered using an action plan format that is not reflected in the computer generated alphabetically organized AVS.    Please see AVS for specific instructions unique to this visit that I personally wrote and verbalized to the the pt in detail and then reviewed with pt  by my nurse highlighting any  changes in therapy recommended at today's visit to their plan of care.

## 2016-12-17 ENCOUNTER — Telehealth: Payer: Self-pay | Admitting: Internal Medicine

## 2016-12-17 NOTE — Telephone Encounter (Signed)
Called pt to relay bronch info- as I was on the phone with pt, other line rang and it was [facility] calling to review recs for bronch tomorrow.  Will close encounter.

## 2016-12-18 ENCOUNTER — Ambulatory Visit (HOSPITAL_COMMUNITY): Payer: MEDICARE

## 2016-12-18 ENCOUNTER — Ambulatory Visit (HOSPITAL_COMMUNITY)
Admission: RE | Admit: 2016-12-18 | Discharge: 2016-12-18 | Disposition: A | Discharge status: Home / Self Care | Payer: MEDICARE | Source: Ambulatory Visit | Attending: Internal Medicine | Admitting: Internal Medicine

## 2016-12-18 ENCOUNTER — Encounter (HOSPITAL_COMMUNITY): Payer: MEDICARE

## 2016-12-18 ENCOUNTER — Encounter (HOSPITAL_COMMUNITY): Payer: Self-pay | Admitting: Respiratory Therapy

## 2016-12-18 ENCOUNTER — Encounter (HOSPITAL_COMMUNITY)
Admission: RE | Disposition: A | Discharge status: Home / Self Care | Payer: Self-pay | Source: Ambulatory Visit | Attending: Internal Medicine

## 2016-12-18 ENCOUNTER — Encounter (HOSPITAL_COMMUNITY): Payer: Self-pay

## 2016-12-18 ENCOUNTER — Ambulatory Visit (HOSPITAL_COMMUNITY): Admit: 2016-12-18 | Payer: MEDICARE | Admitting: Internal Medicine

## 2016-12-18 DIAGNOSIS — M069 Rheumatoid arthritis, unspecified: Secondary | ICD-10-CM | POA: Insufficient documentation

## 2016-12-18 DIAGNOSIS — R911 Solitary pulmonary nodule: Secondary | ICD-10-CM

## 2016-12-18 DIAGNOSIS — J479 Bronchiectasis, uncomplicated: Secondary | ICD-10-CM | POA: Insufficient documentation

## 2016-12-18 DIAGNOSIS — R848 Other abnormal findings in specimens from respiratory organs and thorax: Secondary | ICD-10-CM | POA: Diagnosis not present

## 2016-12-18 DIAGNOSIS — K219 Gastro-esophageal reflux disease without esophagitis: Secondary | ICD-10-CM | POA: Diagnosis not present

## 2016-12-18 DIAGNOSIS — Z79899 Other long term (current) drug therapy: Secondary | ICD-10-CM | POA: Insufficient documentation

## 2016-12-18 DIAGNOSIS — J42 Unspecified chronic bronchitis: Secondary | ICD-10-CM | POA: Diagnosis not present

## 2016-12-18 DIAGNOSIS — Z9889 Other specified postprocedural states: Secondary | ICD-10-CM

## 2016-12-18 HISTORY — PX: VIDEO BRONCHOSCOPY: SHX5072

## 2016-12-18 SURGERY — BRONCHOSCOPY, WITH FLUOROSCOPY
Anesthesia: Moderate Sedation | Laterality: Bilateral

## 2016-12-18 MED ORDER — MEPERIDINE HCL 100 MG/ML IJ SOLN: 100.0000 mg | Freq: Once | INTRAMUSCULAR | Status: DC

## 2016-12-18 MED ORDER — LIDOCAINE HCL 2 % EX GEL: 1.0000 "application " | Freq: Once | CUTANEOUS | Status: DC

## 2016-12-18 MED ORDER — LIDOCAINE HCL 1 % IJ SOLN
INTRAMUSCULAR | Status: DC | PRN
  Administered 2016-12-18: 6 mL via RESPIRATORY_TRACT

## 2016-12-18 MED ORDER — MIDAZOLAM HCL 5 MG/ML IJ SOLN
INTRAMUSCULAR | Status: AC
  Filled 2016-12-18: qty 2

## 2016-12-18 MED ORDER — MIDAZOLAM HCL 5 MG/ML IJ SOLN: 1.0000 mg | Freq: Once | INTRAMUSCULAR | Status: DC

## 2016-12-18 MED ORDER — MEPERIDINE HCL 100 MG/ML IJ SOLN
INTRAMUSCULAR | Status: AC
  Filled 2016-12-18: qty 2

## 2016-12-18 MED ORDER — PHENYLEPHRINE HCL 0.25 % NA SOLN
NASAL | Status: DC | PRN
  Administered 2016-12-18: 2 via NASAL

## 2016-12-18 MED ORDER — LIDOCAINE HCL 2 % EX GEL
CUTANEOUS | Status: DC | PRN
  Administered 2016-12-18: 1

## 2016-12-18 MED ORDER — PHENYLEPHRINE HCL 0.25 % NA SOLN: 1.0000 | Freq: Four times a day (QID) | NASAL | Status: DC | PRN

## 2016-12-18 MED ORDER — SODIUM CHLORIDE 0.9 % IV SOLN
INTRAVENOUS | Status: DC
  Administered 2016-12-18: 09:00:00 via INTRAVENOUS

## 2016-12-18 MED ORDER — MIDAZOLAM HCL 10 MG/2ML IJ SOLN
INTRAMUSCULAR | Status: DC | PRN
  Administered 2016-12-18 (×2): 2.5 mg via INTRAVENOUS

## 2016-12-18 MED ORDER — MEPERIDINE HCL 25 MG/ML IJ SOLN
INTRAMUSCULAR | Status: DC | PRN
  Administered 2016-12-18 (×2): 25 mg via INTRAVENOUS

## 2016-12-18 MED ORDER — FENTANYL CITRATE (PF) 100 MCG/2ML IJ SOLN: INTRAMUSCULAR | Status: DC | PRN

## 2016-12-18 NOTE — Op Note (Signed)
Bronchoscopy Procedure Note  Date of Operation: 12/18/2016   Pre-op Diagnosis: bronchiectasis  Post-op Diagnosis: lung nodule  Surgeon: Christinia Gully  Anesthesia: Monitored Local Anesthesia with Sedation Time Started: 907 am versed total 5 mg and demerol 50 mg IV Time Stopped:  925 am  Operation: Video Flexible fiberoptic bronchoscopy, diagnostic   Findings: nl airways  Specimen: bal LLL/ tbb LLL  Estimated Blood Loss: min  Complications: none  Indications and History: See updated H and P same date. The risks, benefits, complications, treatment options and expected outcomes were discussed with the patient.  The possibilities of reaction to medication, pulmonary aspiration, perforation of a viscus, bleeding, failure to diagnose a condition and creating a complication requiring transfusion or operation were discussed with the patient who freely signed the consent.    Description of Procedure: The patient was re-examined in the bronchoscopy suite and the site of surgery properly noted/marked.  The patient was identified  and the procedure verified as Flexible Fiberoptic Bronchoscopy.  A Time Out was held and the above information confirmed.   After the induction of topical nasopharyngeal anesthesia, the patient was positioned  and the bronchoscope was passed through the L naris. The vocal cords were visualized and  1% buffered lidocaine 5 ml was topically placed onto the cords. The cords were nl. The scope was then passed into the trachea.  1% buffered lidocaine given topically. Airways inspected bilaterally to the subsegmental level with the following findings:  Airways were nl    Interventions:  BAL LLL for cyt afb and fungus stain/ culture TBBx by fluro  LLL for surg path      The Patient was taken to the Endoscopy Recovery area in satisfactory condition.  Attestation: I performed the procedure.  Christinia Gully, MD Pulmonary and [facility] [phone] After 5:30 PM or weekends, call [phone]

## 2016-12-18 NOTE — H&P (Signed)
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 Pulmonary Consult    Referred by Dr. Deland Pretty. Pt c/o SOB for the past 6 months. She is SOB only when she reads out loud.   noted onset of voice fatigue 6 month prior to OV   indolent onset and progressive to where she can't read out loud for a minute before has to stop  Assoc with mild hoarseness but no cough or doe. >>PPI/Pepcid rx/ gerd diet      11/23/2015 NP Follow up : Dyspnea/Abn CT chest  Pt returns for 3 month follow up . Seen last ov for pulmonary consult for dyspnea and abnormal CT chest with LLL opacity .   SHe was started on PPI/Pepcid for upper airway irritation w/ hoarseness, voice fatigue and throat clearing .  Pt has RA hx on remicaid.  PFT today shows normal lung function with FEV1 122%, ratio 83, FVC 110%, DLCO 82%. . No BD response.  CT chest 07/2015 showed LLL mass like area of distortion w/ areas of bronchiectasis in this area.  Does feel she some better since last ov. Not as winded and voice fatigue is some better.  Gets winded with prolonged speaking and reading aloud.  Leads sedentary lifestyle.  rec Chest xray today  Continue on current regimen   02/23/2016  f/u ov/Kendra Taylor re: abnormal cxr     Chief Complaint  Patient presents with  . Follow-up    Breathing has improved, but not back to baseline. No new co's today.   intermittent pnd daytime, has not tried otc's yet/ does not wake her from sleep  Not limited  by breathing from desired activities  No change rx / f/u cxr in 3 m    05/27/2016  f/u ov/Kendra Taylor re: uacs/ abn cxr c/w bronchiectasis      Chief Complaint  Patient presents with  . Follow-up    Pt states her SOB has improved since last OV. Pt states her acid reflux has also improved along with her cough. Pt states overall she feels she is doing well. Pt states she is going to try to go back to using her CPAP.   RA ok / no am cough / congestion   rec Stop protonix and take zantac 150 mg twice daily - after breakfast and after supper x 2 weeks then just after supper x 2 weeks then off and if coughing recurs you need to see a GI specialist    12/12/2016  f/u ov/Kendra Taylor re: f/u bronchiectasis / RA pt never smoker Chief Complaint  Patient presents with  . Follow-up    pt states she is doing well today.    developed acid hb p stopped ppi s zantac  So resumed ppi (did not follow instructions re using zantac during w/d from ppi)   Not limited by breathing from desired activities  No obvious day to day or daytime variability or assoc excess/ purulent sputum or mucus plugs or hemoptysis or cp or chest tightness, subjective wheeze or overt sinus or hb symptoms. No unusual exp hx or h/o childhood pna/ asthma or knowledge of premature birth.  Sleeping ok without nocturnal  or early am exacerbation  of respiratory  c/o's or need for noct saba. Also denies any obvious fluctuation of symptoms with weather or environmental changes or other aggravating or alleviating factors except as outlined above   Current Medications, Allergies, Complete Past Medical History, Past Surgical History, Family History, and Social History were reviewed in [employer] record.  ROS  The following are not active complaints unless bolded sore throat, dysphagia, dental problems, itching, sneezing,  nasal congestion or excess/ purulent secretions, ear ache,   fever, chills, sweats, unintended wt  loss, classically pleuritic or exertional cp,  orthopnea pnd or leg swelling, presyncope, palpitations, abdominal pain, anorexia, nausea, vomiting, diarrhea  or change in bowel or bladder habits, change in stools or urine, dysuria,hematuria,  rash, arthralgias, visual complaints, headache, numbness, weakness or ataxia or problems with walking or coordination,  change in mood/affect or memory.                   Objective:   Physical Exam  amb wf nad    12/12/2016        132     05/27/2016        131  02/23/16 131 lb 9.6 oz (59.7 kg)  11/23/15 129 lb (58.5 kg)  08/23/15 129 lb (58.5 kg)    Vital signs reviewed - Note on arrival 02 sats  97% on RA       HEENT: nl dentition, turbinates, and oropharynx. Nl external ear canals without cough reflex   NECK :  without JVD/Nodes/TM/ nl carotid upstrokes bilaterally   LUNGS: no acc muscle use,  Nl contour chest which is clear to A and P bilaterally without cough on insp or exp maneuvers   CV:  RRR  no s3 or murmur or increase in P2, no edema   ABD:  soft and nontender with nl inspiratory excursion in the supine position. No bruits or organomegaly, bowel sounds nl  MS:  Nl gait/ ext warm without   calf tenderness, cyanosis or clubbing Classic RA changes with ulner deviation on L, corrected on R s/p surgery on mcps  SKIN: warm and dry without lesions    NEURO:  alert, approp, nl sensorium with  no motor deficits       CXR PA and Lateral:   12/12/2016 :    I personally reviewed images and agree with radiology impression as follows:   Stable nodular density in the left lung base. No new focal abnormality is seen. My impression is that the infiltrate appears more dense Lateral basal segment           Patient Instructions by Tanda Rockers, MD at 12/12/2016 10:15 AM   Author: Tanda Rockers, MD Author Type: Physician Filed: 12/15/2016 1:02 PM  Note Status: Addendum Cosign: Cosign Not Required Encounter  Date: 12/12/2016  Editor: Tanda Rockers, MD (Physician)  Prior Versions: 1. Tanda Rockers, MD (Physician) at 12/12/2016 11:02 AM - Signed    Please remember to go to the  x-ray department downstairs in the basement  for your tests - we will call you with the results when they are available.       rec  FOB :  Scheduled for 12/18/16     Assessment & Plan Note by Tanda Rockers, MD at 12/13/2016 7:50 AM   Author: Tanda Rockers, MD Author Type: Physician Filed: 12/15/2016 1:01 PM  Note Status: Bernell List: Cosign Not Required Encounter Date: 12/12/2016  Problem: Bronchiectasis without acute exacerbation [facility])  Editor: Tanda Rockers, MD (Physician)  Prior Versions: 1. Tanda Rockers, MD (Physician) at 12/13/2016 7:51 AM - Written    See CT chest 08/07/15 corresponding to conspicuous density on PA CXR 07/28/15  - - PFTs 11/23/15 wnl - CT 12/19/15 No appreciable interval change in the irregular masslike area of architectural distortion left lower lobe> never smoker / Pos RA so rec f/u ov w/in  3 months to review options - cxr 02/23/2016 less prominent density on both pa and lat views vs 07/18/15 > no change 05/27/2016 > ? More dense 12/12/2016 > rec FOB with tbbx as clearly viz on cxr vs PET - the problem with the latter is false pos in pt with bronchiectasis and RA with ddx def to include MAI in this setting.   Discussed in detail all the  indications, usual  risks and alternatives  relative to the benefits with patient who agrees to proceed with FOB/bx          Assessment & Plan Note by Tanda Rockers, MD at 12/13/2016 7:47 AM   Author: Tanda Rockers, MD Author Type: Physician Filed: 12/13/2016 7:49 AM  Note Status: Written Cosign: Cosign Not Required Encounter Date: 12/12/2016  Problem: Upper airway cough syndrome with classic voice fatigue >> doe   Editor: Tanda Rockers, MD (Physician)    rec trial of gerd rx 08/23/2015 >>> - PFTs 11/23/15 wnl  Mild flare off ppi with over  HB, ok to try to taper again off ppi but should use the zantac bid to help prevent rebound HB    Instructions   Please remember to go to the  x-ray department downstairs in the basement  for your tests - we will call you with the results when they are available.       rec FOB :  Scheduled for 12/18/16      .12/18/2016 Day of FOB / no change in hx or exam    Christinia Gully, MD Pulmonary and [place] [phone] After 5:30 PM or weekends, use [facility]per [phone]

## 2016-12-18 NOTE — Progress Notes (Signed)
Video Bronchoscopy done  Intervention bronchial washing done Intervention Bronchial biopsy done  Procedure tolerated well

## 2016-12-18 NOTE — Discharge Instructions (Signed)
Flexible Bronchoscopy, Care After These instructions give you information on caring for yourself after your procedure. Your doctor may also give you more specific instructions. Call your doctor if you have any problems or questions after your procedure. Follow these instructions at home:  Do not eat or drink anything for 2 hours after your procedure. If you try to eat or drink before the medicine wears off, food or drink could go into your lungs. You could also burn yourself.  After 2 hours have passed and when you can cough and gag normally, you may eat soft food and drink liquids slowly.  The day after the test, you may eat your normal diet.  You may do your normal activities.  Keep all doctor visits. Get help right away if:  You get more and more short of breath.  You get light-headed.  You feel like you are going to pass out (faint).  You have chest pain.  You have new problems that worry you.  You cough up more than a little blood.  You cough up more blood than before. This information is not intended to replace advice given to you by your health care provider. Make sure you discuss any questions you have with your health care provider. Document Released: 02/10/2009 Document Revised: 09/21/2015 Document Reviewed: 12/18/2012 Elsevier Interactive Patient Education  2017 Jay.  Nothing to eat or drink until  11:15  am today  12/18/2016

## 2016-12-19 LAB — ACID FAST SMEAR (AFB, MYCOBACTERIA): Acid Fast Smear: NEGATIVE

## 2016-12-20 ENCOUNTER — Encounter (HOSPITAL_COMMUNITY): Payer: Self-pay | Admitting: Internal Medicine

## 2016-12-20 NOTE — Progress Notes (Signed)
ATC, line busy

## 2016-12-23 DIAGNOSIS — K219 Gastro-esophageal reflux disease without esophagitis: Secondary | ICD-10-CM | POA: Diagnosis not present

## 2016-12-23 DIAGNOSIS — Z79899 Other long term (current) drug therapy: Secondary | ICD-10-CM | POA: Diagnosis not present

## 2016-12-23 DIAGNOSIS — M8580 Other specified disorders of bone density and structure, unspecified site: Secondary | ICD-10-CM | POA: Diagnosis not present

## 2016-12-23 DIAGNOSIS — M25579 Pain in unspecified ankle and joints of unspecified foot: Secondary | ICD-10-CM | POA: Diagnosis not present

## 2016-12-23 DIAGNOSIS — M069 Rheumatoid arthritis, unspecified: Secondary | ICD-10-CM | POA: Diagnosis not present

## 2016-12-23 NOTE — Progress Notes (Signed)
Spoke with pt and notified of results per Dr. Wert. Pt verbalized understanding and denied any questions. 

## 2017-01-03 LAB — FUNGUS CULTURE WITH STAIN

## 2017-01-03 LAB — FUNGUS CULTURE RESULT

## 2017-01-03 LAB — FUNGAL ORGANISM REFLEX

## 2017-01-15 ENCOUNTER — Ambulatory Visit: Payer: MEDICARE | Admitting: Internal Medicine

## 2017-01-17 DIAGNOSIS — M0589 Other rheumatoid arthritis with rheumatoid factor of multiple sites: Secondary | ICD-10-CM | POA: Diagnosis not present

## 2017-01-31 LAB — ACID FAST CULTURE WITH REFLEXED SENSITIVITIES (MYCOBACTERIA): Acid Fast Culture: NEGATIVE

## 2017-02-03 DIAGNOSIS — D3131 Benign neoplasm of right choroid: Secondary | ICD-10-CM | POA: Diagnosis not present

## 2017-02-10 ENCOUNTER — Ambulatory Visit (INDEPENDENT_AMBULATORY_CARE_PROVIDER_SITE_OTHER): Payer: MEDICARE | Admitting: Internal Medicine

## 2017-02-10 ENCOUNTER — Encounter: Payer: Self-pay | Admitting: Internal Medicine

## 2017-02-10 ENCOUNTER — Ambulatory Visit (INDEPENDENT_AMBULATORY_CARE_PROVIDER_SITE_OTHER)
Admission: RE | Admit: 2017-02-10 | Discharge: 2017-02-10 | Disposition: A | Discharge status: Home / Self Care | Payer: MEDICARE | Source: Ambulatory Visit | Attending: Internal Medicine | Admitting: Internal Medicine

## 2017-02-10 VITALS — BP 114/74 | HR 76 | Ht 62.0 in | Wt 131.0 lb

## 2017-02-10 DIAGNOSIS — J479 Bronchiectasis, uncomplicated: Secondary | ICD-10-CM

## 2017-02-10 DIAGNOSIS — R918 Other nonspecific abnormal finding of lung field: Secondary | ICD-10-CM | POA: Diagnosis not present

## 2017-02-10 DIAGNOSIS — R911 Solitary pulmonary nodule: Secondary | ICD-10-CM | POA: Diagnosis not present

## 2017-02-10 NOTE — Patient Instructions (Signed)
Please see patient coordinator before you leave today  to schedule super D CT of chest

## 2017-02-10 NOTE — Progress Notes (Signed)
Subjective:    Patient ID: Kendra Taylor, female    DOB: 14-Jan-1946,     MRN: [id number]     Brief patient profile:  50 yowf  Never smoker with RA since 1960/70s   / very sedentary due to RA since 2015 but still able to walk up flight of steps and do shopping but bothered by sob speaking x fall 2016 so cxr > abn LLL > confirmed on CT chest >  referred to pulmonary clinic 08/23/2015 by Dr Shelia Media    History of Present Illness  08/23/2015 1st [facility] office visit/ Kendra Taylor   Chief Complaint  Patient presents with  . Pulmonary Consult    Referred by Dr. Deland Pretty. Pt c/o SOB for the past 6 months. She is SOB only when she reads out loud.   noted onset of voice fatigue 6 month prior to OV   indolent onset and progressive to where she can't read out loud for a minute before has to stop  Assoc with mild hoarseness but no cough or doe. >>PPI/Pepcid rx/ gerd diet      11/23/2015 NP Follow up : Dyspnea/Abn CT chest  Pt returns for 3 month follow up . Seen last ov for pulmonary consult for dyspnea and abnormal CT chest with LLL opacity .   SHe was started on PPI/Pepcid for upper airway irritation w/ hoarseness, voice fatigue and throat clearing .  Pt has RA hx on remicaid.  PFT today shows normal lung function with FEV1 122%, ratio 83, FVC 110%, DLCO 82%. . No BD response.  CT chest 07/2015 showed LLL mass like area of distortion w/ areas of bronchiectasis in this area.  Does feel she some better since last ov. Not as winded and voice fatigue is some better.  Gets winded with prolonged speaking and reading aloud.  Leads sedentary lifestyle.  rec Chest xray today  Continue on current regimen   02/23/2016  f/u ov/Kendra Taylor re: abnormal cxr Chief Complaint  Patient presents with  . Follow-up    Breathing has improved, but not back to baseline. No new co's today.   intermittent pnd daytime, has not tried otc's yet/ does not wake her from sleep  Not limited by breathing from desired  activities  No change rx / f/u cxr in 3 m    05/27/2016  f/u ov/Kendra Taylor re: uacs/ abn cxr c/w bronchiectasis  Chief Complaint  Patient presents with  . Follow-up    Pt states her SOB has improved since last OV. Pt states her acid reflux has also improved along with her cough. Pt states overall she feels she is doing well. Pt states she is going to try to go back to using her CPAP.   RA ok / no am cough / congestion   rec Stop protonix and take zantac 150 mg twice daily - after breakfast and after supper x 2 weeks then just after supper x 2 weeks then off and if coughing recurs you need to see a GI specialist    12/12/2016  f/u ov/Kendra Taylor re: f/u bronchiectasis / RA pt never smoker Chief Complaint  Patient presents with  . Follow-up    pt states she is doing well today.    developed acid hb p stopped ppi s zantac  So resumed ppi (did not follow instructions re using zantac during w/d from ppi)  Not limited by breathing from desired activities  rec FOB  12/18/16  Nl airways/ neg cyt/ neg bx/ ?  Mold     02/10/2017  f/u ov/Kendra Taylor re: f/ u Bronchiectasis/ LLL nodule/ off mtx since April 2018 and on remicade  Chief Complaint  Patient presents with  . Follow-up    CXR was repeated today. Her breathing has improved some. She states her appetite has not been as good as it was before.   no cough  or L cp  Arthritis not as well controlled off mtx Not limited by breathing from desired activities     No obvious day to day or daytime variability or assoc excess/ purulent sputum or mucus plugs or hemoptysis or cp or chest tightness, subjective wheeze or overt sinus or hb symptoms. No unusual exp hx or h/o childhood pna/ asthma or knowledge of premature birth.  Sleeping ok flat without nocturnal  or early am exacerbation  of respiratory  c/o's or need for noct saba. Also denies any obvious fluctuation of symptoms with weather or environmental changes or other aggravating or alleviating factors except as  outlined above   Current Allergies, Complete Past Medical History, Past Surgical History, Family History, and Social History were reviewed in [employer] record.  ROS  The following are not active complaints unless bolded Hoarseness, sore throat, dysphagia, dental problems, itching, sneezing,  nasal congestion or discharge of excess mucus or purulent secretions, ear ache,   fever, chills, sweats, unintended wt loss or wt gain, classically pleuritic or exertional cp,  orthopnea pnd or leg swelling, presyncope, palpitations, abdominal pain, anorexia, nausea, vomiting, diarrhea  or change in bowel habits or change in bladder habits, change in stools or change in urine, dysuria, hematuria,  rash, arthralgias, visual complaints, headache, numbness, weakness or ataxia or problems with walking or coordination,  change in mood/affect or memory.        Current Meds  Medication Sig  . CALCIUM CARBONATE PO Take 1,000 mg by mouth daily.  . Cholecalciferol (VITAMIN D) 2000 units tablet Take 2,000 Units by mouth daily.   . folic acid (FOLVITE) 1 MG tablet Take 1 mg by mouth daily.    Marland Kitchen inFLIXimab (REMICADE) 100 MG injection Inject into the vein every 8 (eight) weeks.   Marland Kitchen levocetirizine (XYZAL) 5 MG tablet Take 5 mg by mouth daily as needed.   . magnesium gluconate (MAGONATE) 500 MG tablet Take 500 mg by mouth daily.  . ondansetron (ZOFRAN-ODT) 4 MG disintegrating tablet Take 4 mg by mouth every 8 (eight) hours as needed for nausea.  . Potassium 99 MG TABS Take 1 tablet by mouth daily.  . ranitidine (ZANTAC) 150 MG tablet Take 1 tablet (150 mg total) by mouth 2 (two) times daily. (Patient taking differently: Take 150 mg by mouth daily as needed for heartburn. )  . SUMAtriptan (IMITREX) 100 MG tablet Take 100 mg by mouth every 2 (two) hours as needed for migraine.  . vitamin B-12 (CYANOCOBALAMIN) 1000 MCG tablet Take 1,000 mcg by mouth daily.  Marland Kitchen zolpidem (AMBIEN) 10 MG tablet Take 10 mg  by mouth at bedtime as needed for sleep.                  Objective:   Physical Exam  amb very pleasant wf nad    02/10/2017      131  12/12/2016        132  05/27/2016        131  02/23/16 131 lb 9.6 oz (59.7 kg)  11/23/15 129 lb (58.5 kg)  08/23/15 129 lb (58.5 kg)  Vital signs reviewed - Note on arrival 02 sats  99% on RA       HEENT: nl dentition, turbinates, and oropharynx. Nl external ear canals without cough reflex   NECK :  without JVD/Nodes/TM/ nl carotid upstrokes bilaterally   LUNGS: no acc muscle use,  Nl contour chest which is clear to A and P bilaterally without cough on insp or exp maneuvers   CV:  RRR  no s3 or murmur or increase in P2, no edema   ABD:  soft and nontender with nl inspiratory excursion in the supine position. No bruits or organomegaly, bowel sounds nl  MS:  Nl gait/ ext warm without   calf tenderness, cyanosis or clubbing Classic RA changes with ulner deviation on L, corrected on R s/p surgery on mcps/ no skin nodules   SKIN: warm and dry without lesions    NEURO:  alert, approp, nl sensorium with  no motor deficits      CXR PA and Lateral:   02/10/2017 :    I personally reviewed images and agree with radiology impression as follows:   Stable left lower lobe nodular density

## 2017-02-11 ENCOUNTER — Encounter: Payer: Self-pay | Admitting: Internal Medicine

## 2017-02-11 DIAGNOSIS — M069 Rheumatoid arthritis, unspecified: Secondary | ICD-10-CM | POA: Diagnosis not present

## 2017-02-11 DIAGNOSIS — H9201 Otalgia, right ear: Secondary | ICD-10-CM | POA: Diagnosis not present

## 2017-02-11 DIAGNOSIS — J3489 Other specified disorders of nose and nasal sinuses: Secondary | ICD-10-CM | POA: Diagnosis not present

## 2017-02-11 NOTE — Assessment & Plan Note (Signed)
-   FOB 12/18/2016  Nl airways>>> neg cyt/neg bx / neg afb smear - CT Super D ordered

## 2017-02-11 NOTE — Assessment & Plan Note (Addendum)
See CT chest 08/07/15 corresponding to conspicuous density on PA CXR 07/28/15  - PFTs 11/23/15 wnl - CT 12/19/15 No appreciable interval change in the irregular masslike area of architectural distortion left lower lobe> never smoker / Pos RA so rec f/u ov w/in  3 months to review options - cxr 02/23/2016 less prominent density on both pa and lat views vs 07/18/15 > no change 05/27/2016 > ? More dense 12/12/2016 > rec FOB with tbbx as clearly viz on cxr  - FOB 12/18/2016  Nl airways>>> neg cyt/neg bx / neg afb smear  > fungal culture pos TYPELLA SCOPARIA which per Dr Johnnye Sima is probably not a pathogen but contaminamt  Presently asymptomatic but not comfortable in this immunocompromised pt not knowing etiology for this nodular process > CT super D and possible nav bx next step as reviewed with Dr Lamonte Sakai   Discussed in detail all the  indications, usual  risks and alternatives  relative to the benefits with patient who agrees to proceed with w/u as outlined   I had an extended discussion with the patient reviewing all relevant studies completed to date and  lasting 15 to 20 minutes of a 25 minute visit    Each maintenance medication was reviewed in detail including most importantly the difference between maintenance and prns and under what circumstances the prns are to be triggered using an action plan format that is not reflected in the computer generated alphabetically organized AVS.    Please see AVS for specific instructions unique to this visit that I personally wrote and verbalized to the the pt in detail and then reviewed with pt  by my nurse highlighting any  changes in therapy recommended at today's visit to their plan of care.

## 2017-02-12 ENCOUNTER — Ambulatory Visit (INDEPENDENT_AMBULATORY_CARE_PROVIDER_SITE_OTHER)
Admission: RE | Admit: 2017-02-12 | Discharge: 2017-02-12 | Disposition: A | Discharge status: Home / Self Care | Payer: MEDICARE | Source: Ambulatory Visit | Attending: Internal Medicine | Admitting: Internal Medicine

## 2017-02-12 DIAGNOSIS — R911 Solitary pulmonary nodule: Secondary | ICD-10-CM | POA: Diagnosis not present

## 2017-02-13 NOTE — Progress Notes (Signed)
Spoke with pt and notified of results per Dr. Wert. Pt verbalized understanding and denied any questions. 

## 2017-02-18 ENCOUNTER — Telehealth: Payer: Self-pay | Admitting: Emergency Medicine

## 2017-02-18 DIAGNOSIS — R911 Solitary pulmonary nodule: Secondary | ICD-10-CM

## 2017-02-18 NOTE — Telephone Encounter (Signed)
Spoke to pt she is aware of this appt and time Kendra Taylor

## 2017-02-18 NOTE — Telephone Encounter (Signed)
ENB scheduled 02/26/17@11 :00am@[facility]  LMTCB for pt Kendra Taylor

## 2017-02-18 NOTE — Telephone Encounter (Signed)
Order has been placed for ENB. Will route message to [facility] to make her aware.

## 2017-02-18 NOTE — Telephone Encounter (Signed)
Dr Melvyn Novas patient, referred to set up [place].

## 2017-02-20 NOTE — Pre-Procedure Instructions (Signed)
Kynlea Blackston Albus  02/20/2017      [id number] - [address], [place] [address] Phone: [phone] Fax: [phone]    Your procedure is scheduled on Oct. 31  Report to [place] at 900 A.M.  Call this number if you have problems the morning of surgery:  [phone]   Remember:  Do not eat food or drink liquids after midnight.  Take these medicines the morning of surgery with A SIP OF WATER Pantoprazole (Protonix), Ondansetron (Zofran) if needed, Sumatriptan (Imitrex) if needed  Stop taking aspirin, BC's, Goody's, Herbal medications, Fish Oil, Ibuprofen, Advil, Motrin, Aleve   Do not wear jewelry, make-up or nail polish.  Do not wear lotions, powders, or perfumes, or deoderant.  Do not shave 48 hours prior to surgery.  Men may shave face and neck.  Do not bring valuables to the hospital.  [facility] is not responsible for any belongings or valuables.  Contacts, dentures or bridgework may not be worn into surgery.  Leave your suitcase in the car.  After surgery it may be brought to your room.  For patients admitted to the hospital, discharge time will be determined by your treatment team.  Patients discharged the day of surgery will not be allowed to drive home.    Special instructions:  [facility] - Preparing for Surgery  Before surgery, you can play an important role.  Because skin is not sterile, your skin needs to be as free of germs as possible.  You can reduce the number of germs on you skin by washing with CHG (chlorahexidine gluconate) soap before surgery.  CHG is an antiseptic cleaner which kills germs and bonds with the skin to continue killing germs even after washing.  Please DO NOT use if you have an allergy to CHG or antibacterial soaps.  If your skin becomes reddened/irritated stop using the CHG and inform your nurse when you  arrive at Short Stay.  Do not shave (including legs and underarms) for at least 48 hours prior to the first CHG shower.  You may shave your face.  Please follow these instructions carefully:   1.  Shower with CHG Soap the night before surgery and the   morning of Surgery.  2.  If you choose to wash your hair, wash your hair first as usual with your       normal shampoo.  3.  After you shampoo, rinse your hair and body thoroughly to remove the                      Shampoo.  4.  Use CHG as you would any other liquid soap.  You can apply chg directly       to the skin and wash gently with scrungie or a clean washcloth.  5.  Apply the CHG Soap to your body ONLY FROM THE NECK DOWN.        Do not use on open wounds or open sores.  Avoid contact with your eyes,       ears, mouth and genitals (private parts).  Wash genitals (private parts)       with your normal soap.  6.  Wash thoroughly, paying special attention to the area where your surgery        will be performed.  7.  Thoroughly rinse your body  with warm water from the neck down.  8.  DO NOT shower/wash with your normal soap after using and rinsing off       the CHG Soap.  9.  Pat yourself dry with a clean towel.            10.  Wear clean pajamas.            11.  Place clean sheets on your bed the night of your first shower and do not        sleep with pets.  Day of Surgery  Do not apply any lotions/deoderants the morning of surgery.  Please wear clean clothes to the hospital/surgery center.     Please read over the following fact sheets that you were given. Pain Booklet, Coughing and Deep Breathing and Surgical Site Infection Prevention

## 2017-02-21 ENCOUNTER — Encounter (HOSPITAL_COMMUNITY): Payer: Self-pay | Admitting: *Deleted

## 2017-02-21 ENCOUNTER — Encounter (HOSPITAL_COMMUNITY)
Admission: RE | Admit: 2017-02-21 | Discharge: 2017-02-21 | Disposition: A | Discharge status: Home / Self Care | Payer: MEDICARE | Source: Ambulatory Visit | Attending: Emergency Medicine | Admitting: Emergency Medicine

## 2017-02-21 DIAGNOSIS — R918 Other nonspecific abnormal finding of lung field: Secondary | ICD-10-CM | POA: Insufficient documentation

## 2017-02-21 DIAGNOSIS — Z01818 Encounter for other preprocedural examination: Secondary | ICD-10-CM | POA: Diagnosis not present

## 2017-02-21 LAB — CBC
HCT: 42.4 % (ref 36.0–46.0)
Hemoglobin: 14 g/dL (ref 12.0–15.0)
MCH: 28.4 pg (ref 26.0–34.0)
MCHC: 33 g/dL (ref 30.0–36.0)
MCV: 86 fL (ref 78.0–100.0)
Platelets: 186 10*3/uL (ref 150–400)
RBC: 4.93 MIL/uL (ref 3.87–5.11)
RDW: 12.9 % (ref 11.5–15.5)
WBC: 5.9 10*3/uL (ref 4.0–10.5)

## 2017-02-21 NOTE — Progress Notes (Signed)
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 Denies any chest pain, fever, or cough. Reports shortness of breath, and that's why she went to see Dr Melvyn Novas.  Has a CPAP, but doesn't wear.

## 2017-02-26 ENCOUNTER — Ambulatory Visit (HOSPITAL_COMMUNITY): Payer: MEDICARE | Admitting: Anesthesiology

## 2017-02-26 ENCOUNTER — Encounter (HOSPITAL_COMMUNITY): Admission: RE | Disposition: A | Discharge status: Home / Self Care | Payer: Self-pay | Attending: Emergency Medicine

## 2017-02-26 ENCOUNTER — Ambulatory Visit (HOSPITAL_COMMUNITY): Payer: MEDICARE

## 2017-02-26 ENCOUNTER — Encounter (HOSPITAL_COMMUNITY): Payer: Self-pay | Admitting: Certified Registered Nurse Anesthetist

## 2017-02-26 ENCOUNTER — Ambulatory Visit (HOSPITAL_COMMUNITY)
Admission: RE | Admit: 2017-02-26 | Discharge: 2017-02-26 | Disposition: A | Discharge status: Home / Self Care | Payer: MEDICARE | Source: Other Acute Inpatient Hospital | Attending: Emergency Medicine | Admitting: Emergency Medicine

## 2017-02-26 DIAGNOSIS — R0600 Dyspnea, unspecified: Secondary | ICD-10-CM | POA: Insufficient documentation

## 2017-02-26 DIAGNOSIS — R05 Cough: Secondary | ICD-10-CM | POA: Diagnosis not present

## 2017-02-26 DIAGNOSIS — J479 Bronchiectasis, uncomplicated: Secondary | ICD-10-CM | POA: Diagnosis not present

## 2017-02-26 DIAGNOSIS — M2550 Pain in unspecified joint: Secondary | ICD-10-CM | POA: Insufficient documentation

## 2017-02-26 DIAGNOSIS — H9209 Otalgia, unspecified ear: Secondary | ICD-10-CM | POA: Insufficient documentation

## 2017-02-26 DIAGNOSIS — D649 Anemia, unspecified: Secondary | ICD-10-CM | POA: Insufficient documentation

## 2017-02-26 DIAGNOSIS — R490 Dysphonia: Secondary | ICD-10-CM | POA: Insufficient documentation

## 2017-02-26 DIAGNOSIS — R911 Solitary pulmonary nodule: Secondary | ICD-10-CM | POA: Diagnosis not present

## 2017-02-26 DIAGNOSIS — Z79899 Other long term (current) drug therapy: Secondary | ICD-10-CM | POA: Diagnosis not present

## 2017-02-26 DIAGNOSIS — F418 Other specified anxiety disorders: Secondary | ICD-10-CM | POA: Diagnosis not present

## 2017-02-26 DIAGNOSIS — R51 Headache: Secondary | ICD-10-CM | POA: Insufficient documentation

## 2017-02-26 DIAGNOSIS — Z419 Encounter for procedure for purposes other than remedying health state, unspecified: Secondary | ICD-10-CM

## 2017-02-26 DIAGNOSIS — R918 Other nonspecific abnormal finding of lung field: Secondary | ICD-10-CM | POA: Diagnosis not present

## 2017-02-26 DIAGNOSIS — R848 Other abnormal findings in specimens from respiratory organs and thorax: Secondary | ICD-10-CM | POA: Diagnosis not present

## 2017-02-26 DIAGNOSIS — G4730 Sleep apnea, unspecified: Secondary | ICD-10-CM | POA: Insufficient documentation

## 2017-02-26 DIAGNOSIS — M069 Rheumatoid arthritis, unspecified: Secondary | ICD-10-CM | POA: Insufficient documentation

## 2017-02-26 DIAGNOSIS — K219 Gastro-esophageal reflux disease without esophagitis: Secondary | ICD-10-CM | POA: Diagnosis not present

## 2017-02-26 DIAGNOSIS — Z9889 Other specified postprocedural states: Secondary | ICD-10-CM

## 2017-02-26 HISTORY — PX: VIDEO BRONCHOSCOPY WITH ENDOBRONCHIAL NAVIGATION: SHX6175

## 2017-02-26 HISTORY — DX: Dyspnea, unspecified: R06.00

## 2017-02-26 SURGERY — VIDEO BRONCHOSCOPY WITH ENDOBRONCHIAL NAVIGATION
Anesthesia: General | Laterality: Left

## 2017-02-26 MED ORDER — PROPOFOL 10 MG/ML IV BOLUS
INTRAVENOUS | Status: AC
  Filled 2017-02-26: qty 20

## 2017-02-26 MED ORDER — SODIUM CHLORIDE 0.9 % IV SOLN: INTRAVENOUS | Status: DC

## 2017-02-26 MED ORDER — PHENYLEPHRINE HCL 10 MG/ML IJ SOLN
INTRAMUSCULAR | Status: DC | PRN
  Administered 2017-02-26: 25 ug/min via INTRAVENOUS

## 2017-02-26 MED ORDER — PHENYLEPHRINE 40 MCG/ML (10ML) SYRINGE FOR IV PUSH (FOR BLOOD PRESSURE SUPPORT)
PREFILLED_SYRINGE | INTRAVENOUS | Status: AC
  Filled 2017-02-26: qty 10

## 2017-02-26 MED ORDER — FENTANYL CITRATE (PF) 100 MCG/2ML IJ SOLN
INTRAMUSCULAR | Status: DC | PRN
  Administered 2017-02-26 (×2): 50 ug via INTRAVENOUS

## 2017-02-26 MED ORDER — ROCURONIUM BROMIDE 10 MG/ML (PF) SYRINGE
PREFILLED_SYRINGE | INTRAVENOUS | Status: AC
  Filled 2017-02-26: qty 5

## 2017-02-26 MED ORDER — MIDAZOLAM HCL 5 MG/5ML IJ SOLN
INTRAMUSCULAR | Status: DC | PRN
  Administered 2017-02-26: 2 mg via INTRAVENOUS

## 2017-02-26 MED ORDER — LIDOCAINE HCL (CARDIAC) 20 MG/ML IV SOLN
INTRAVENOUS | Status: DC | PRN
  Administered 2017-02-26: 80 mg via INTRAVENOUS

## 2017-02-26 MED ORDER — FENTANYL CITRATE (PF) 250 MCG/5ML IJ SOLN
INTRAMUSCULAR | Status: AC
  Filled 2017-02-26: qty 5

## 2017-02-26 MED ORDER — ROCURONIUM BROMIDE 100 MG/10ML IV SOLN
INTRAVENOUS | Status: DC | PRN
Start: 2017-02-26 — End: 2017-02-26
  Administered 2017-02-26: 50 mg via INTRAVENOUS
  Administered 2017-02-26: 10 mg via INTRAVENOUS

## 2017-02-26 MED ORDER — DEXAMETHASONE SODIUM PHOSPHATE 10 MG/ML IJ SOLN
INTRAMUSCULAR | Status: DC | PRN
  Administered 2017-02-26: 10 mg via INTRAVENOUS

## 2017-02-26 MED ORDER — ONDANSETRON HCL 4 MG/2ML IJ SOLN: 4.0000 mg | Freq: Once | INTRAMUSCULAR | Status: DC | PRN

## 2017-02-26 MED ORDER — FENTANYL CITRATE (PF) 100 MCG/2ML IJ SOLN: 25.0000 ug | INTRAMUSCULAR | Status: DC | PRN

## 2017-02-26 MED ORDER — PROPOFOL 10 MG/ML IV BOLUS
INTRAVENOUS | Status: DC | PRN
  Administered 2017-02-26: 150 mg via INTRAVENOUS

## 2017-02-26 MED ORDER — ONDANSETRON HCL 4 MG/2ML IJ SOLN
INTRAMUSCULAR | Status: AC
  Filled 2017-02-26: qty 2

## 2017-02-26 MED ORDER — SUGAMMADEX SODIUM 200 MG/2ML IV SOLN
INTRAVENOUS | Status: DC | PRN
  Administered 2017-02-26: 120 mg via INTRAVENOUS

## 2017-02-26 MED ORDER — DEXAMETHASONE SODIUM PHOSPHATE 10 MG/ML IJ SOLN
INTRAMUSCULAR | Status: AC
  Filled 2017-02-26: qty 1

## 2017-02-26 MED ORDER — MIDAZOLAM HCL 2 MG/2ML IJ SOLN
INTRAMUSCULAR | Status: AC
  Filled 2017-02-26: qty 2

## 2017-02-26 MED ORDER — SUGAMMADEX SODIUM 200 MG/2ML IV SOLN
INTRAVENOUS | Status: AC
  Filled 2017-02-26: qty 2

## 2017-02-26 MED ORDER — LACTATED RINGERS IV SOLN
INTRAVENOUS | Status: DC
  Administered 2017-02-26: 50 mL/h via INTRAVENOUS

## 2017-02-26 MED ORDER — RANITIDINE HCL 150 MG PO TABS: 150.0000 mg | ORAL_TABLET | Freq: Every day | ORAL | Status: DC | PRN

## 2017-02-26 MED ORDER — LIDOCAINE 2% (20 MG/ML) 5 ML SYRINGE
INTRAMUSCULAR | Status: AC
  Filled 2017-02-26: qty 5

## 2017-02-26 MED ORDER — PHENYLEPHRINE 40 MCG/ML (10ML) SYRINGE FOR IV PUSH (FOR BLOOD PRESSURE SUPPORT)
PREFILLED_SYRINGE | INTRAVENOUS | Status: DC | PRN
Start: 2017-02-26 — End: 2017-02-26
  Administered 2017-02-26 (×2): 80 ug via INTRAVENOUS

## 2017-02-26 MED ORDER — ONDANSETRON HCL 4 MG/2ML IJ SOLN
INTRAMUSCULAR | Status: DC | PRN
  Administered 2017-02-26: 4 mg via INTRAVENOUS

## 2017-02-26 MED ORDER — 0.9 % SODIUM CHLORIDE (POUR BTL) OPTIME
TOPICAL | Status: DC | PRN
Start: 2017-02-26 — End: 2017-02-26
  Administered 2017-02-26: 1000 mL

## 2017-02-26 SURGICAL SUPPLY — 37 items
ADAPTER BRONCH F/PENTAX (ADAPTER) ×2 IMPLANT
ADPR BSCP EDG PNTX (ADAPTER) ×1
BRUSH CYTOL CELLEBRITY 1.5X140 (MISCELLANEOUS) ×2 IMPLANT
BRUSH SUPERTRAX BIOPSY (INSTRUMENTS) ×1 IMPLANT
BRUSH SUPERTRAX NDL-TIP CYTO (INSTRUMENTS) ×2 IMPLANT
CANISTER SUCT 3000ML PPV (MISCELLANEOUS) ×2 IMPLANT
CHANNEL WORK EXTEND EDGE 180 (KITS) IMPLANT
CHANNEL WORK EXTEND EDGE 45 (KITS) IMPLANT
CHANNEL WORK EXTEND EDGE 90 (KITS) IMPLANT
CONT SPEC 4OZ CLIKSEAL STRL BL (MISCELLANEOUS) ×2 IMPLANT
COVER BACK TABLE 60X90IN (DRAPES) ×2 IMPLANT
FILTER STRAW FLUID ASPIR (MISCELLANEOUS) IMPLANT
FORCEPS BIOP SUPERTRX PREMAR (INSTRUMENTS) ×2 IMPLANT
GAUZE SPONGE 4X4 12PLY STRL (GAUZE/BANDAGES/DRESSINGS) ×2 IMPLANT
GLOVE BIO SURGEON STRL SZ7.5 (GLOVE) ×4 IMPLANT
GOWN STRL REUS W/ TWL LRG LVL3 (GOWN DISPOSABLE) ×2 IMPLANT
GOWN STRL REUS W/TWL LRG LVL3 (GOWN DISPOSABLE) ×4
KIT CLEAN ENDO COMPLIANCE (KITS) ×2 IMPLANT
KIT LOCATABLE GUIDE (CANNULA) IMPLANT
KIT MARKER FIDUCIAL DELIVERY (KITS) IMPLANT
KIT PROCEDURE EDGE 180 (KITS) ×1 IMPLANT
KIT ROOM TURNOVER OR (KITS) ×2 IMPLANT
MARKER SKIN DUAL TIP RULER LAB (MISCELLANEOUS) ×2 IMPLANT
NDL SUPERTRX PREMARK BIOPSY (NEEDLE) ×1 IMPLANT
NEEDLE SUPERTRX PREMARK BIOPSY (NEEDLE) ×2 IMPLANT
NS IRRIG 1000ML POUR BTL (IV SOLUTION) ×2 IMPLANT
OIL SILICONE PENTAX (PARTS (SERVICE/REPAIRS)) ×2 IMPLANT
PAD ARMBOARD 7.5X6 YLW CONV (MISCELLANEOUS) ×4 IMPLANT
PATCHES PATIENT (LABEL) ×4 IMPLANT
SYR 20CC LL (SYRINGE) ×3 IMPLANT
SYR 20ML ECCENTRIC (SYRINGE) ×2 IMPLANT
SYR 50ML SLIP (SYRINGE) ×2 IMPLANT
TOWEL OR 17X24 6PK STRL BLUE (TOWEL DISPOSABLE) ×2 IMPLANT
TRAP SPECIMEN MUCOUS 40CC (MISCELLANEOUS) IMPLANT
TUBE CONNECTING 20X1/4 (TUBING) ×2 IMPLANT
UNDERPAD 30X30 (UNDERPADS AND DIAPERS) ×2 IMPLANT
WATER STERILE IRR 1000ML POUR (IV SOLUTION) ×2 IMPLANT

## 2017-02-26 NOTE — Brief Op Note (Signed)
Video Bronchoscopy with Electromagnetic Navigation Procedure Note  Date of Operation: 02/26/2017  Pre-op Diagnosis: Left lower lobe opacity  Post-op Diagnosis: Same  Surgeon: Baltazar Apo  Assistants: None  Anesthesia: General endotracheal anesthesia  Operation: Flexible video fiberoptic bronchoscopy with electromagnetic navigation and biopsies.  Estimated Blood Loss: Minimal  Complications: None apparent  Indications and History: Kendra Taylor is a 71 y.o. female with history of rheumatoid arthritis on Remicade.  She was evaluated by Dr. Melvyn Novas for dyspnea and cough.  As part of the evaluation chest imaging was performed that identified an irregularly shaped somewhat rounded left lower lobe infiltrate.  Bronchoscopy from 12/18/16 was unrevealing.  Recommendation was made to pursue navigational bronchoscopy to allow direct sampling of the abnormality.  The risks, benefits, complications, treatment options and expected outcomes were discussed with the patient.  The possibilities of pneumothorax, pneumonia, reaction to medication, pulmonary aspiration, perforation of a viscus, bleeding, failure to diagnose a condition and creating a complication requiring transfusion or operation were discussed with the patient who freely signed the consent.    Description of Procedure: The patient was seen in the Preoperative Area, was examined and was deemed appropriate to proceed.  The patient was taken to OR 10, identified as Kendra Taylor and the procedure verified as Flexible Video Fiberoptic Bronchoscopy.  A Time Out was held and the above information confirmed.   Prior to the date of the procedure a high-resolution CT scan of the chest was performed. Utilizing Gosper a virtual tracheobronchial tree was generated to allow the creation of distinct navigation pathways to the patient's left lower lobe parenchymal abnormalities. After being taken to the operating room general  anesthesia was initiated and the patient  was orally intubated. The video fiberoptic bronchoscope was introduced via the endotracheal tube and a general inspection was performed which showed normal airways throughout.  There were some thin tan secretions that were easily suctioned. The extendable working channel and locator guide were introduced into the bronchoscope. The distinct navigation pathways prepared prior to this procedure were then utilized to navigate to within 0.8 cm of patient's lesion identified on CT scan. The extendable working channel was secured into place and the locator guide was withdrawn. Under fluoroscopic guidance transbronchial needle brushings and transbronchial forceps biopsies were performed to be sent for cytology and pathology. A bronchioalveolar lavage was performed in the left lower lobe and sent for cytology and microbiology (bacterial, fungal, AFB smears and cultures). At the end of the procedure a general airway inspection was performed and there was no evidence of active bleeding. The bronchoscope was removed.  The patient tolerated the procedure well. There was no significant blood loss and there were no obvious complications. A post-procedural chest x-ray is pending.  Samples: 1. Transbronchial needle brushings from LLL opacity 2. Transbronchial forceps biopsies from LLL opacity 3. Bronchoalveolar lavage from LLL  Plans:  The patient will be discharged from the PACU to home when recovered from anesthesia and after chest x-ray is reviewed. We will review the cytology, pathology and microbiology results with the patient when they become available. Outpatient followup will be with Dr Lamonte Sakai or Dr Melvyn Novas.   Baltazar Apo, MD, PhD 02/26/2017, 12:23 PM [facility] [phone] or if no answer [phone]

## 2017-02-26 NOTE — Interval H&P Note (Signed)
PCCM Interval Note  Pt presents today for further eval of her LLL opacity. She has hx RA on remicade. No new issues reported. We reviewed the procedure, the risks and benefits. She elects to proceed. No barriers identified.   Baltazar Apo, MD, PhD 02/26/2017, 10:52 AM [facility] [phone] or if no answer [phone]

## 2017-02-26 NOTE — Transfer of Care (Signed)
Immediate Anesthesia Transfer of Care Note  Patient: Kendra Taylor  Procedure(s) Performed: VIDEO BRONCHOSCOPY WITH ENDOBRONCHIAL NAVIGATION (Left )  Patient Location: PACU  Anesthesia Type:General  Level of Consciousness: drowsy and patient cooperative  Airway & Oxygen Therapy: Patient Spontanous Breathing and Patient connected to face mask oxygen  Post-op Assessment: Report given to RN and Post -op Vital signs reviewed and stable  Post vital signs: Reviewed and stable  Last Vitals:  Vitals:   02/26/17 1227 02/26/17 1228  BP: 137/88   Pulse:    Resp: 17   Temp: (!) 36.2 C (!) 36.2 C  SpO2: 100%     Last Pain:  Vitals:   02/26/17 1228  TempSrc: Tympanic  PainSc:       Patients Stated Pain Goal: 2 (00/76/22 6333)  Complications: No apparent anesthesia complications

## 2017-02-26 NOTE — Anesthesia Preprocedure Evaluation (Addendum)
Anesthesia Evaluation  Patient identified by MRN, date of birth, ID band Patient awake    Reviewed: Allergy & Precautions, H&P , NPO status , Patient's Chart, lab work & pertinent test results  Airway Mallampati: I  TM Distance: >3 FB Neck ROM: Full    Dental  (+) Dental Advisory Given, Teeth Intact, Chipped   Pulmonary sleep apnea ,  Left lung mass   breath sounds clear to auscultation       Cardiovascular Exercise Tolerance: Good negative cardio ROS   Rhythm:Regular Rate:Normal     Neuro/Psych  Headaches, PSYCHIATRIC DISORDERS Anxiety Depression    GI/Hepatic Neg liver ROS, GERD  Medicated and Controlled,  Endo/Other  negative endocrine ROS  Renal/GU negative Renal ROS  negative genitourinary   Musculoskeletal  (+) Arthritis , Rheumatoid disorders,    Abdominal   Peds  Hematology negative hematology ROS (+) anemia ,   Anesthesia Other Findings   Reproductive/Obstetrics                            Anesthesia Physical  Anesthesia Plan  ASA: III  Anesthesia Plan: General   Post-op Pain Management:    Induction: Intravenous  PONV Risk Score and Plan: 3 and Ondansetron, Dexamethasone and Treatment may vary due to age or medical condition  Airway Management Planned: Oral ETT  Additional Equipment: None  Intra-op Plan:   Post-operative Plan: Extubation in OR  Informed Consent: I have reviewed the patients History and Physical, chart, labs and discussed the procedure including the risks, benefits and alternatives for the proposed anesthesia with the patient or authorized representative who has indicated his/her understanding and acceptance.   Dental advisory given  Plan Discussed with: CRNA  Anesthesia Plan Comments: (Large ETT to facilitate bronch)        Anesthesia Quick Evaluation

## 2017-02-26 NOTE — Anesthesia Postprocedure Evaluation (Signed)
Anesthesia Post Note  Patient: Jazmarie Biever Apps  Procedure(s) Performed: VIDEO BRONCHOSCOPY WITH ENDOBRONCHIAL NAVIGATION (Left )     Patient location during evaluation: PACU Anesthesia Type: General Level of consciousness: awake and alert Pain management: pain level controlled Vital Signs Assessment: post-procedure vital signs reviewed and stable Respiratory status: spontaneous breathing, nonlabored ventilation and respiratory function stable Cardiovascular status: blood pressure returned to baseline and stable Postop Assessment: no apparent nausea or vomiting Anesthetic complications: no    Last Vitals:  Vitals:   02/26/17 1242 02/26/17 1250  BP: 125/82 128/81  Pulse: 81 76  Resp: 16 20  Temp:  (!) 36.3 C  SpO2: 100% 100%    Last Pain:  Vitals:   02/26/17 1250  TempSrc:   PainSc: 0-No pain                 Audry Pili

## 2017-02-26 NOTE — Anesthesia Procedure Notes (Signed)
Procedure Name: Intubation Date/Time: 02/26/2017 11:09 AM Performed by: Candis Shine Pre-anesthesia Checklist: Patient identified, Emergency Drugs available, Suction available and Patient being monitored Patient Re-evaluated:Patient Re-evaluated prior to induction Oxygen Delivery Method: Circle System Utilized Preoxygenation: Pre-oxygenation with 100% oxygen Induction Type: IV induction Ventilation: Mask ventilation without difficulty Laryngoscope Size: Mac and 3 Grade View: Grade I Tube type: Oral Tube size: 8.5 mm Number of attempts: 1 Airway Equipment and Method: Stylet Placement Confirmation: ETT inserted through vocal cords under direct vision,  positive ETCO2 and breath sounds checked- equal and bilateral Secured at: 22 cm Tube secured with: Tape Dental Injury: Teeth and Oropharynx as per pre-operative assessment

## 2017-02-26 NOTE — Discharge Instructions (Signed)
Flexible Bronchoscopy, Care After These instructions give you information on caring for yourself after your procedure. Your doctor may also give you more specific instructions. Call your doctor if you have any problems or questions after your procedure. Follow these instructions at home:  Do not eat or drink anything for 2 hours after your procedure. If you try to eat or drink before the medicine wears off, food or drink could go into your lungs. You could also burn yourself.  After 2 hours have passed and when you can cough and gag normally, you may eat soft food and drink liquids slowly.  The day after the test, you may eat your normal diet.  You may do your normal activities.  Keep all doctor visits. Get help right away if:  You get more and more short of breath.  You get light-headed.  You feel like you are going to pass out (faint).  You have chest pain.  You have new problems that worry you.  You cough up more than a little blood.  You cough up more blood than before. This information is not intended to replace advice given to you by your health care provider. Make sure you discuss any questions you have with your health care provider. Document Released: 02/10/2009 Document Revised: 09/21/2015 Document Reviewed: 12/18/2012 Elsevier Interactive Patient Education  2017 [place].   Please call our office for any questions or concerns [phone]

## 2017-02-26 NOTE — H&P (View-Only) (Signed)
Subjective:    Patient ID: Kendra Taylor, female    DOB: April 11, 1946,     MRN: [id number]     Brief patient profile:  39 yowf  Never smoker with RA since 1960/70s   / very sedentary due to RA since 2015 but still able to walk up flight of steps and do shopping but bothered by sob speaking x fall 2016 so cxr > abn LLL > confirmed on CT chest >  referred to pulmonary clinic 08/23/2015 by Dr Shelia Media    History of Present Illness  08/23/2015 1st [facility] office visit/ Kendra Taylor   Chief Complaint  Patient presents with  . Pulmonary Consult    Referred by Dr. Deland Pretty. Pt c/o SOB for the past 6 months. She is SOB only when she reads out loud.   noted onset of voice fatigue 6 month prior to OV   indolent onset and progressive to where she can't read out loud for a minute before has to stop  Assoc with mild hoarseness but no cough or doe. >>PPI/Pepcid rx/ gerd diet      11/23/2015 NP Follow up : Dyspnea/Abn CT chest  Pt returns for 3 month follow up . Seen last ov for pulmonary consult for dyspnea and abnormal CT chest with LLL opacity .   SHe was started on PPI/Pepcid for upper airway irritation w/ hoarseness, voice fatigue and throat clearing .  Pt has RA hx on remicaid.  PFT today shows normal lung function with FEV1 122%, ratio 83, FVC 110%, DLCO 82%. . No BD response.  CT chest 07/2015 showed LLL mass like area of distortion w/ areas of bronchiectasis in this area.  Does feel she some better since last ov. Not as winded and voice fatigue is some better.  Gets winded with prolonged speaking and reading aloud.  Leads sedentary lifestyle.  rec Chest xray today  Continue on current regimen   02/23/2016  f/u ov/Kendra Taylor re: abnormal cxr Chief Complaint  Patient presents with  . Follow-up    Breathing has improved, but not back to baseline. No new co's today.   intermittent pnd daytime, has not tried otc's yet/ does not wake her from sleep  Not limited by breathing from desired  activities  No change rx / f/u cxr in 3 m    05/27/2016  f/u ov/Kendra Taylor re: uacs/ abn cxr c/w bronchiectasis  Chief Complaint  Patient presents with  . Follow-up    Pt states her SOB has improved since last OV. Pt states her acid reflux has also improved along with her cough. Pt states overall she feels she is doing well. Pt states she is going to try to go back to using her CPAP.   RA ok / no am cough / congestion   rec Stop protonix and take zantac 150 mg twice daily - after breakfast and after supper x 2 weeks then just after supper x 2 weeks then off and if coughing recurs you need to see a GI specialist    12/12/2016  f/u ov/Kendra Taylor re: f/u bronchiectasis / RA pt never smoker Chief Complaint  Patient presents with  . Follow-up    pt states she is doing well today.    developed acid hb p stopped ppi s zantac  So resumed ppi (did not follow instructions re using zantac during w/d from ppi)  Not limited by breathing from desired activities  rec FOB  12/18/16  Nl airways/ neg cyt/ neg bx/ ?  Mold     02/10/2017  f/u ov/Kendra Taylor re: f/ u Bronchiectasis/ LLL nodule/ off mtx since April 2018 and on remicade  Chief Complaint  Patient presents with  . Follow-up    CXR was repeated today. Her breathing has improved some. She states her appetite has not been as good as it was before.   no cough  or L cp  Arthritis not as well controlled off mtx Not limited by breathing from desired activities     No obvious day to day or daytime variability or assoc excess/ purulent sputum or mucus plugs or hemoptysis or cp or chest tightness, subjective wheeze or overt sinus or hb symptoms. No unusual exp hx or h/o childhood pna/ asthma or knowledge of premature birth.  Sleeping ok flat without nocturnal  or early am exacerbation  of respiratory  c/o's or need for noct saba. Also denies any obvious fluctuation of symptoms with weather or environmental changes or other aggravating or alleviating factors except as  outlined above   Current Allergies, Complete Past Medical History, Past Surgical History, Family History, and Social History were reviewed in [employer] record.  ROS  The following are not active complaints unless bolded Hoarseness, sore throat, dysphagia, dental problems, itching, sneezing,  nasal congestion or discharge of excess mucus or purulent secretions, ear ache,   fever, chills, sweats, unintended wt loss or wt gain, classically pleuritic or exertional cp,  orthopnea pnd or leg swelling, presyncope, palpitations, abdominal pain, anorexia, nausea, vomiting, diarrhea  or change in bowel habits or change in bladder habits, change in stools or change in urine, dysuria, hematuria,  rash, arthralgias, visual complaints, headache, numbness, weakness or ataxia or problems with walking or coordination,  change in mood/affect or memory.        Current Meds  Medication Sig  . CALCIUM CARBONATE PO Take 1,000 mg by mouth daily.  . Cholecalciferol (VITAMIN D) 2000 units tablet Take 2,000 Units by mouth daily.   . folic acid (FOLVITE) 1 MG tablet Take 1 mg by mouth daily.    Marland Kitchen inFLIXimab (REMICADE) 100 MG injection Inject into the vein every 8 (eight) weeks.   Marland Kitchen levocetirizine (XYZAL) 5 MG tablet Take 5 mg by mouth daily as needed.   . magnesium gluconate (MAGONATE) 500 MG tablet Take 500 mg by mouth daily.  . ondansetron (ZOFRAN-ODT) 4 MG disintegrating tablet Take 4 mg by mouth every 8 (eight) hours as needed for nausea.  . Potassium 99 MG TABS Take 1 tablet by mouth daily.  . ranitidine (ZANTAC) 150 MG tablet Take 1 tablet (150 mg total) by mouth 2 (two) times daily. (Patient taking differently: Take 150 mg by mouth daily as needed for heartburn. )  . SUMAtriptan (IMITREX) 100 MG tablet Take 100 mg by mouth every 2 (two) hours as needed for migraine.  . vitamin B-12 (CYANOCOBALAMIN) 1000 MCG tablet Take 1,000 mcg by mouth daily.  Marland Kitchen zolpidem (AMBIEN) 10 MG tablet Take 10 mg  by mouth at bedtime as needed for sleep.                  Objective:   Physical Exam  amb very pleasant wf nad    02/10/2017      131  12/12/2016        132  05/27/2016        131  02/23/16 131 lb 9.6 oz (59.7 kg)  11/23/15 129 lb (58.5 kg)  08/23/15 129 lb (58.5 kg)  Vital signs reviewed - Note on arrival 02 sats  99% on RA       HEENT: nl dentition, turbinates, and oropharynx. Nl external ear canals without cough reflex   NECK :  without JVD/Nodes/TM/ nl carotid upstrokes bilaterally   LUNGS: no acc muscle use,  Nl contour chest which is clear to A and P bilaterally without cough on insp or exp maneuvers   CV:  RRR  no s3 or murmur or increase in P2, no edema   ABD:  soft and nontender with nl inspiratory excursion in the supine position. No bruits or organomegaly, bowel sounds nl  MS:  Nl gait/ ext warm without   calf tenderness, cyanosis or clubbing Classic RA changes with ulner deviation on L, corrected on R s/p surgery on mcps/ no skin nodules   SKIN: warm and dry without lesions    NEURO:  alert, approp, nl sensorium with  no motor deficits      CXR PA and Lateral:   02/10/2017 :    I personally reviewed images and agree with radiology impression as follows:   Stable left lower lobe nodular density

## 2017-02-27 ENCOUNTER — Encounter (HOSPITAL_COMMUNITY): Payer: Self-pay | Admitting: Emergency Medicine

## 2017-02-27 LAB — ACID FAST SMEAR (AFB, MYCOBACTERIA): Acid Fast Smear: NEGATIVE

## 2017-02-28 LAB — CULTURE, RESPIRATORY W GRAM STAIN: Culture: NO GROWTH

## 2017-02-28 NOTE — Progress Notes (Signed)
Spoke with pt and notified of results per Dr. Wert. Pt verbalized understanding and denied any questions. 

## 2017-03-03 ENCOUNTER — Inpatient Hospital Stay: Payer: MEDICARE | Admitting: Internal Medicine

## 2017-03-03 LAB — ANAEROBIC CULTURE

## 2017-03-04 ENCOUNTER — Telehealth: Payer: Self-pay | Admitting: Emergency Medicine

## 2017-03-04 NOTE — Telephone Encounter (Signed)
Please tell her I'm sorry - Dr wert had told me that he was going to give the results to her. No evidence of cancer cells, all cultures negative so far.   With regard to the swollen glands, this can be caused by multiple things. I believe she needs to be seen as OV. If she has symptoms that are suggestive, could have a pharyngitis, mono, etc.

## 2017-03-04 NOTE — Telephone Encounter (Signed)
Spoke with patient. She is aware of RB's recs. She verbalized understanding about results. She wishes to have these results faxed to Dr. Pennie Banter office. Advised patient that I would fax them for her.   She stated that she would follow up with Dr. Pennie Banter office. Nothing else needed at time of call.

## 2017-03-04 NOTE — Telephone Encounter (Signed)
Spoke with patient. She is requesting the results of her bronchoscopy that she had on 10/31.   She also stated that since her procedure, she has noticed she has swollen glands around her neck. They are very sensitive to touch. She is concerned that they are infected. Denied any fevers or body aches.   RB, please advise. Thanks!

## 2017-03-07 DIAGNOSIS — R9389 Abnormal findings on diagnostic imaging of other specified body structures: Secondary | ICD-10-CM | POA: Diagnosis not present

## 2017-03-07 DIAGNOSIS — J479 Bronchiectasis, uncomplicated: Secondary | ICD-10-CM | POA: Diagnosis not present

## 2017-03-07 DIAGNOSIS — Z23 Encounter for immunization: Secondary | ICD-10-CM | POA: Diagnosis not present

## 2017-03-11 DIAGNOSIS — M0589 Other rheumatoid arthritis with rheumatoid factor of multiple sites: Secondary | ICD-10-CM | POA: Diagnosis not present

## 2017-03-12 ENCOUNTER — Other Ambulatory Visit: Payer: Self-pay | Admitting: Physician Assistant

## 2017-03-12 DIAGNOSIS — C4491 Basal cell carcinoma of skin, unspecified: Secondary | ICD-10-CM

## 2017-03-12 DIAGNOSIS — C44212 Basal cell carcinoma of skin of right ear and external auricular canal: Secondary | ICD-10-CM | POA: Diagnosis not present

## 2017-03-12 HISTORY — DX: Basal cell carcinoma of skin, unspecified: C44.91

## 2017-03-18 DIAGNOSIS — L089 Local infection of the skin and subcutaneous tissue, unspecified: Secondary | ICD-10-CM | POA: Diagnosis not present

## 2017-03-18 DIAGNOSIS — Z5189 Encounter for other specified aftercare: Secondary | ICD-10-CM | POA: Diagnosis not present

## 2017-03-30 LAB — FUNGUS CULTURE WITH STAIN

## 2017-03-30 LAB — FUNGUS CULTURE RESULT

## 2017-03-30 LAB — FUNGAL ORGANISM REFLEX

## 2017-04-11 LAB — ACID FAST CULTURE WITH REFLEXED SENSITIVITIES (MYCOBACTERIA): Acid Fast Culture: NEGATIVE

## 2017-04-21 ENCOUNTER — Ambulatory Visit (INDEPENDENT_AMBULATORY_CARE_PROVIDER_SITE_OTHER): Payer: MEDICARE | Admitting: Family Medicine

## 2017-04-21 ENCOUNTER — Encounter: Payer: Self-pay | Admitting: Family Medicine

## 2017-04-21 ENCOUNTER — Other Ambulatory Visit: Payer: Self-pay

## 2017-04-21 VITALS — BP 132/82 | HR 72 | Temp 99.3°F | Resp 16 | Ht 62.99 in | Wt 129.0 lb

## 2017-04-21 DIAGNOSIS — H7291 Unspecified perforation of tympanic membrane, right ear: Secondary | ICD-10-CM | POA: Diagnosis not present

## 2017-04-21 DIAGNOSIS — H918X1 Other specified hearing loss, right ear: Secondary | ICD-10-CM

## 2017-04-21 DIAGNOSIS — H6591 Unspecified nonsuppurative otitis media, right ear: Secondary | ICD-10-CM

## 2017-04-21 DIAGNOSIS — H1032 Unspecified acute conjunctivitis, left eye: Secondary | ICD-10-CM | POA: Diagnosis not present

## 2017-04-21 DIAGNOSIS — J069 Acute upper respiratory infection, unspecified: Secondary | ICD-10-CM | POA: Diagnosis not present

## 2017-04-21 MED ORDER — POLYMYXIN B-TRIMETHOPRIM 10000-0.1 UNIT/ML-% OP SOLN: 2.0000 [drp] | OPHTHALMIC | 0 refills | Status: AC

## 2017-04-21 MED ORDER — AMOXICILLIN-POT CLAVULANATE 875-125 MG PO TABS: 1.0000 | ORAL_TABLET | Freq: Two times a day (BID) | ORAL | 0 refills | Status: DC

## 2017-04-21 MED ORDER — FLUCONAZOLE 150 MG PO TABS: 150.0000 mg | ORAL_TABLET | Freq: Once | ORAL | 0 refills | Status: AC

## 2017-04-21 NOTE — Progress Notes (Signed)
Chief Complaint  Patient presents with  . Ear Pain    with blood drainage x 3days   . Cough    with some sore throat x 1 week   . 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 Anemia   . Anxiety   . Arthritis   . Depression   . Dyspnea   . GERD (gastroesophageal reflux disease)   . HA (headache)   . Migraine   . Sleep apnea    cpap    Current Outpatient Medications  Medication Sig Dispense Refill  . CALCIUM CARBONATE PO Take 1,000 mg by mouth daily.    . Cholecalciferol (VITAMIN D) 2000 units tablet Take 2,000 Units by mouth daily.     . folic acid (FOLVITE) 1 MG tablet Take 1 mg by mouth daily.      Marland Kitchen inFLIXimab (REMICADE) 100 MG injection Inject into the vein every 8 (eight) weeks.     Marland Kitchen levocetirizine (XYZAL) 5 MG tablet Take 5 mg by mouth daily as needed for allergies.     . magnesium gluconate (MAGONATE) 500 MG tablet Take 500 mg by mouth every other day.     . ondansetron (ZOFRAN-ODT) 4 MG disintegrating tablet Take 4 mg by mouth every 8 (eight) hours as needed for nausea.    . pantoprazole (PROTONIX) 20 MG tablet Take 20 mg by mouth daily.    Vladimir Faster  Glycol-Propyl Glycol (SYSTANE OP) Place 2 drops into both eyes as needed (for dry eyes).    . Potassium 99 MG TABS Take 99 mg by mouth daily.     . ranitidine (ZANTAC) 150 MG tablet Take 1 tablet (150 mg total) by mouth daily as needed for heartburn.    . SUMAtriptan (IMITREX) 100 MG tablet Take 100 mg by mouth every 2 (two) hours as needed for migraine.    . triamcinolone cream (KENALOG) 0.1 % Apply 1 application topically as needed (for skin irritation).    . vitamin B-12 (CYANOCOBALAMIN) 1000 MCG tablet Take 1,000 mcg by mouth daily.    Marland Kitchen zolpidem (AMBIEN) 10 MG tablet Take 10 mg by mouth at bedtime as needed for sleep.    Marland Kitchen amoxicillin-clavulanate (AUGMENTIN) 875-125 MG tablet Take 1 tablet by mouth 2 (two) times daily. 20 tablet 0  . fluconazole (DIFLUCAN) 150 MG tablet Take 1 tablet (150 mg total) by mouth once for 1 dose. May repeat second dose in 3 days 2 tablet 0  . trimethoprim-polymyxin b (POLYTRIM) ophthalmic solution Place  2 drops into both eyes every 4 (four) hours for 10 days. 10 mL 0   No current facility-administered medications for this visit.     Allergies:  Allergies  Allergen Reactions  . Aspirin Other (See Comments)    Ringing in ears with large doses, large doses causes her to lose hearing    Past Surgical History:  Procedure Laterality Date  . ABDOMINAL HYSTERECTOMY    . BUNIONECTOMY     both  . FINGER ARTHROPLASTY Right 07/30/2012   Procedure: RIGHT INDEX FINGER, MIDDLE FINGER, RING FINGER, SMALL FINGER MCP ARTHROPLASTY WITH REALIGNMENT/REPAIR OF EXTENSOR TENDON;  Surgeon: Roseanne Kaufman, MD;  Location: [facility];  Service: Orthopedics;  Laterality: Right;  . FINGER SURGERY    . HAMMER TOE SURGERY Left 03/03/2014   Procedure: SECOND THROUGH FIFTH HAMMERTOE CORRECTION;  Surgeon: Wylene Simmer, MD;  Location: [facility];  Service: Orthopedics;  Laterality: Left;  . JOINT REPLACEMENT    . METATARSAL HEAD EXCISION Left 03/03/2014   Procedure: LEFT SECOND  THROUGH FIFTH METATARSAL HEAD EXCISION;  Surgeon: Wylene Simmer, MD;  Location: [facility];  Service: Orthopedics;  Laterality: Left;  . OOPHORECTOMY    . VIDEO BRONCHOSCOPY Bilateral 12/18/2016   Procedure: VIDEO BRONCHOSCOPY WITH FLUORO;  Surgeon: Tanda Rockers, MD;  Location: [facility];  Service: Cardiopulmonary;  Laterality: Bilateral;  . VIDEO BRONCHOSCOPY WITH ENDOBRONCHIAL NAVIGATION Left 02/26/2017   Procedure: VIDEO BRONCHOSCOPY WITH ENDOBRONCHIAL NAVIGATION;  Surgeon: Collene Gobble, MD;  Location: [facility];  Service: Thoracic;  Laterality: Left;    Social History   Socioeconomic History  . Marital status: Married    Spouse name: Remo Lipps  . Number of children: 0  . Years of education: masters  . Highest education level: None  Social Needs  . Financial resource strain: None  . Food insecurity - worry: None  . Food insecurity - inability: None  . Transportation needs - medical: None  . Transportation needs - non-medical: None  Occupational History    Comment: Retired  Tobacco Use  . Smoking status: Never Smoker  . Smokeless tobacco: Never Used  Substance and Sexual Activity  . Alcohol use: No    Alcohol/week: 0.0 oz  . Drug use: No  . Sexual activity: None  Other Topics Concern  . None  Social History Narrative   Patient lives at home with her spouse (Paltrineri Remo Lipps.) Patient has two cats.   Retired.   Arboriculturist.   Right handed.   Caffeine One cup of coffee daily.    Family History  Problem Relation Age of Onset  . Dementia Mother   . Sleep apnea Mother   . Hyperthyroidism Mother   . Dementia Father   . Sleep apnea Father      ROS Review of Systems See HPI Constitution: No fevers or chills No malaise No diaphoresis Skin: No rash or itching Eyes: SEE HPI GU: no dysuria or hematuria Neuro: no dizziness or headaches all others reviewed and negative   Objective: Vitals:   04/21/17 0934  BP: 132/82  Pulse: 72  Resp: 16    Temp: 99.3 F (37.4 C)  TempSrc: Oral  SpO2: 95%  Weight: 129 lb (58.5 kg)  Height: 5' 2.99" (1.6 m)    Physical Exam  Constitutional: She is oriented to person, place, and time. She appears well-developed and well-nourished.  HENT:  Head: Normocephalic and atraumatic.  Eyes: EOM are normal. Pupils are equal, round, and reactive to light. Left eye  exhibits discharge. No scleral icterus.  Cardiovascular: Normal rate, regular rhythm and normal heart sounds.  Pulmonary/Chest: Effort normal and breath sounds normal. No stridor. No respiratory distress. She has no wheezes. She has no rales.  Neurological: She is alert and oriented to person, place, and time.  Skin: Skin is warm. Capillary refill takes less than 2 seconds.  Psychiatric: She has a normal mood and affect. Her behavior is normal. Judgment and thought content normal.    Assessment and Plan Lachina was seen today for ear pain, cough and eye drainage.  Diagnoses and all orders for this visit:  Acute bacterial conjunctivitis of left eye Acute URI Otitis media, serous, tm rupture, right Other specified hearing loss of right ear, unspecified hearing status on contralateral side  Pt with disorder of ear, throat and eyes Gave broad coverage with augmentin As well as eye drop polytrim Also advised follow up with ENT Dr. Constance Holster Diflucan if  Yeast infection occurs after Augmentin  Other orders -     amoxicillin-clavulanate (AUGMENTIN) 875-125 MG tablet; Take 1 tablet by mouth 2 (two) times daily. -     trimethoprim-polymyxin b (POLYTRIM) ophthalmic solution; Place 2 drops into both eyes every 4 (four) hours for 10 days. -     fluconazole (DIFLUCAN) 150 MG tablet; Take 1 tablet (150 mg total) by mouth once for 1 dose. May repeat second dose in 3 days     [place]

## 2017-04-21 NOTE — Patient Instructions (Addendum)
  Department Care Team Description  [facility] [place]  [address]  [place]  [place], [place] [address]  [phone]  Beckie Salts, [place]  [place], [place] [address]  [phone]  [phone] (Fax)  Rheumatoid arthritis, involving unspecified site, unspecified rheumatoid factor presence ([place]) (Primary Dx);  Nasal lesion;  Right ear pain   Please call Dr. Constance Holster to get an appointment for the ruptured Tympanic Membrane [phone]    IF you received an x-ray today, you will receive an invoice from [facility]. Please contact [facility] at [phone] with questions or concerns regarding your invoice.   IF you received labwork today, you will receive an invoice from [facility]. Please contact [facility] at [phone] with questions or concerns regarding your invoice.   Our billing staff will not be able to assist you with questions regarding bills from these companies.  You will be contacted with the lab results as soon as they are available. The fastest way to get your results is to activate your My Chart account. Instructions are located on the last page of this paperwork. If you have not heard from [place] regarding the results in 2 weeks, please contact this office.

## 2017-04-25 DIAGNOSIS — H906 Mixed conductive and sensorineural hearing loss, bilateral: Secondary | ICD-10-CM | POA: Diagnosis not present

## 2017-04-25 DIAGNOSIS — H66013 Acute suppurative otitis media with spontaneous rupture of ear drum, bilateral: Secondary | ICD-10-CM | POA: Diagnosis not present

## 2017-04-25 DIAGNOSIS — H1013 Acute atopic conjunctivitis, bilateral: Secondary | ICD-10-CM | POA: Diagnosis not present

## 2017-05-07 DIAGNOSIS — H1013 Acute atopic conjunctivitis, bilateral: Secondary | ICD-10-CM | POA: Diagnosis not present

## 2017-05-13 DIAGNOSIS — M0589 Other rheumatoid arthritis with rheumatoid factor of multiple sites: Secondary | ICD-10-CM | POA: Diagnosis not present

## 2017-05-20 DIAGNOSIS — R0981 Nasal congestion: Secondary | ICD-10-CM | POA: Insufficient documentation

## 2017-05-20 DIAGNOSIS — J3489 Other specified disorders of nose and nasal sinuses: Secondary | ICD-10-CM | POA: Diagnosis not present

## 2017-05-20 DIAGNOSIS — M069 Rheumatoid arthritis, unspecified: Secondary | ICD-10-CM | POA: Diagnosis not present

## 2017-05-20 DIAGNOSIS — H906 Mixed conductive and sensorineural hearing loss, bilateral: Secondary | ICD-10-CM | POA: Diagnosis not present

## 2017-05-29 DIAGNOSIS — L219 Seborrheic dermatitis, unspecified: Secondary | ICD-10-CM | POA: Diagnosis not present

## 2017-05-29 DIAGNOSIS — L300 Nummular dermatitis: Secondary | ICD-10-CM | POA: Diagnosis not present

## 2017-06-12 DIAGNOSIS — M8580 Other specified disorders of bone density and structure, unspecified site: Secondary | ICD-10-CM | POA: Diagnosis not present

## 2017-06-12 DIAGNOSIS — M069 Rheumatoid arthritis, unspecified: Secondary | ICD-10-CM | POA: Diagnosis not present

## 2017-06-12 DIAGNOSIS — M859 Disorder of bone density and structure, unspecified: Secondary | ICD-10-CM | POA: Diagnosis not present

## 2017-06-12 DIAGNOSIS — K219 Gastro-esophageal reflux disease without esophagitis: Secondary | ICD-10-CM | POA: Diagnosis not present

## 2017-06-12 DIAGNOSIS — Z79899 Other long term (current) drug therapy: Secondary | ICD-10-CM | POA: Diagnosis not present

## 2017-07-01 DIAGNOSIS — M0589 Other rheumatoid arthritis with rheumatoid factor of multiple sites: Secondary | ICD-10-CM | POA: Diagnosis not present

## 2017-07-16 DIAGNOSIS — C44212 Basal cell carcinoma of skin of right ear and external auricular canal: Secondary | ICD-10-CM | POA: Diagnosis not present

## 2017-08-19 DIAGNOSIS — M0589 Other rheumatoid arthritis with rheumatoid factor of multiple sites: Secondary | ICD-10-CM | POA: Diagnosis not present

## 2017-09-29 ENCOUNTER — Ambulatory Visit (INDEPENDENT_AMBULATORY_CARE_PROVIDER_SITE_OTHER): Payer: MEDICARE | Admitting: Physician Assistant

## 2017-09-29 ENCOUNTER — Other Ambulatory Visit: Payer: Self-pay

## 2017-09-29 ENCOUNTER — Encounter: Payer: Self-pay | Admitting: Physician Assistant

## 2017-09-29 VITALS — BP 102/60 | HR 85 | Temp 99.4°F | Ht 62.4 in | Wt 124.8 lb

## 2017-09-29 DIAGNOSIS — R350 Frequency of micturition: Secondary | ICD-10-CM | POA: Diagnosis not present

## 2017-09-29 DIAGNOSIS — R5383 Other fatigue: Secondary | ICD-10-CM

## 2017-09-29 DIAGNOSIS — R519 Headache, unspecified: Secondary | ICD-10-CM

## 2017-09-29 DIAGNOSIS — R358 Other polyuria: Secondary | ICD-10-CM | POA: Diagnosis not present

## 2017-09-29 DIAGNOSIS — R51 Headache: Secondary | ICD-10-CM | POA: Diagnosis not present

## 2017-09-29 DIAGNOSIS — R82998 Other abnormal findings in urine: Secondary | ICD-10-CM

## 2017-09-29 DIAGNOSIS — W57XXXA Bitten or stung by nonvenomous insect and other nonvenomous arthropods, initial encounter: Secondary | ICD-10-CM

## 2017-09-29 LAB — POCT URINALYSIS DIP (MANUAL ENTRY)
Bilirubin, UA: NEGATIVE
Glucose, UA: NEGATIVE mg/dL
Ketones, POC UA: NEGATIVE mg/dL
Nitrite, UA: NEGATIVE
Protein Ur, POC: NEGATIVE mg/dL
Spec Grav, UA: 1.01 (ref 1.010–1.025)
Urobilinogen, UA: 0.2 E.U./dL
pH, UA: 6 (ref 5.0–8.0)

## 2017-09-29 MED ORDER — DOXYCYCLINE HYCLATE 100 MG PO CAPS: 100.0000 mg | ORAL_CAPSULE | Freq: Two times a day (BID) | ORAL | 7 refills | Status: DC

## 2017-09-29 NOTE — Patient Instructions (Addendum)
Your results indicate you have a UTI. I have given you a prescription for an antibiotic (which will also treat prophylatically for lyme disease and RMSF). Please take with food. I have sent off a urine culture and we should have those results in 48 hours. If your symptoms worsen while you are awaiting these results or you develop fever, chills, flank pian, nausea and vomiting, please seek care immediately.   While taking Doxycycline:  -Do not drink milk or take iron supplements, multivitamins, calcium supplements, antacids, laxatives within 2 hours before or after taking doxycycline. -Avoid direct exposure to sunlight or tanning beds. Doxycycline can make you sunburn more easily. Wear protective clothing and use sunscreen (SPF 30 or higher) when you are outdoors. -Antibiotic medicines can cause diarrhea, which may be a sign of a new infection. If you have diarrhea that is watery or bloody, stop taking this medicine and seek medical care.    Tetracyclines may increase the serum concentration of Methotrexate. Signs of methotrexate toxicity can include but are not limited to: severe vomiting, diarrhea, mouth sores, dehydration, and/or increased heart rate. Please seek care immediately if you develop any of these symptoms.    Urinary Tract Infection, Adult A urinary tract infection (UTI) is an infection of any part of the urinary tract. The urinary tract includes the:  Kidneys.  Ureters.  Bladder.  Urethra.  These organs make, store, and get rid of pee (urine) in the body. Follow these instructions at home:  Take over-the-counter and prescription medicines only as told by your doctor.  If you were prescribed an antibiotic medicine, take it as told by your doctor. Do not stop taking the antibiotic even if you start to feel better.  Avoid the following drinks: ? Alcohol. ? Caffeine. ? Tea. ? Carbonated drinks.  Drink enough fluid to keep your pee clear or pale yellow.  Keep all  follow-up visits as told by your doctor. This is important.  Make sure to: ? Empty your bladder often and completely. Do not to hold pee for long periods of time. ? Empty your bladder before and after sex. ? Wipe from front to back after a bowel movement if you are female. Use each tissue one time when you wipe. Contact a doctor if:  You have back pain.  You have a fever.  You feel sick to your stomach (nauseous).  You throw up (vomit).  Your symptoms do not get better after 3 days.  Your symptoms go away and then come back. Get help right away if:  You have very bad back pain.  You have very bad lower belly (abdominal) pain.  You are throwing up and cannot keep down any medicines or water. This information is not intended to replace advice given to you by your health care provider. Make sure you discuss any questions you have with your health care provider. Document Released: 10/02/2007 Document Revised: 09/21/2015 Document Reviewed: 03/06/2015 Elsevier Interactive Patient Education  2018 [employer], Adult Ticks are insects that can bite. Most ticks live in shrubs and grassy areas. They climb onto people and animals that go by. Then they bite. Some ticks carry germs that can make you sick. How can I prevent tick bites?  Use an insect repellent that has 20% or higher of the ingredients DEET, picaridin, or IR3535. Put this insect repellent on: ? Bare skin. ? The tops of your boots. ? Your pant legs. ? The ends of your sleeves.  If you use an insect repellent that has the ingredient permethrin, make sure to follow the instructions on the bottle. Treat the following: ? Clothing. ? Supplies. ? Boots. ? Tents.  Wear long sleeves, long pants, and light colors.  Tuck your pant legs into your socks.  Stay in the middle of the trail.  Try not to walk through long grass.  Before going inside your house, check your clothes, hair, and skin for  ticks. Make sure to check your head, neck, armpits, waist, groin, and joint areas.  Check for ticks every day.  When you come indoors: ? Wash your clothes right away. ? Shower right away. ? Dry your clothes in a dryer on high heat for 60 minutes or more. What is the right way to remove a tick? Remove a tick from your skin as soon as possible.  To remove a tick that is crawling on your skin: ? Go outdoors and brush the tick off. ? Use tape or a lint roller.  To remove a tick that is biting: ? Wash your hands. ? If you have latex gloves, put them on. ? Use tweezers, curved forceps, or a tick-removal tool to grasp the tick. Grasp the tick as close to your skin and as close to the tick's head as possible. ? Gently pull up until the tick lets go.  Try to keep the tick's head attached to its body.  Do not twist or jerk the tick.  Do not squeeze or crush the tick.  Do not try to remove a tick with heat, alcohol, petroleum jelly, or fingernail polish. How should I get rid of a tick? Here are some ways to get rid of a tick that is alive:  Place the tick in rubbing alcohol.  Place the tick in a bag or container you can close tightly.  Wrap the tick tightly in tape.  Flush the tick down the toilet.  Contact a doctor if:  You have symptoms of a disease, such as: ? Pain in a muscle, joint, or bone. ? Trouble walking or moving your legs. ? Numbness in your legs. ? Inability to move (paralysis). ? A red rash that makes a circle (bull's-eye rash). ? Redness and swelling where the tick bit you. ? A fever. ? Throwing up (vomiting) over and over. ? Diarrhea. ? Weight loss. ? Tender and swollen lymph glands. ? Shortness of breath. ? Cough. ? Belly pain (abdominal pain). ? Headache. ? Being more tired than normal. ? A change in how alert (conscious) you are. ? Confusion. Get help right away if:  You cannot remove a tick.  A part of a tick breaks off and gets stuck in your  skin.  You are feeling worse. Summary  Ticks may carry germs that can make you sick.  To prevent tick bites, wear long sleeves, long pants, and light colors. Use insect repellent. Follow the instructions on the bottle.  If the tick is biting, do not try to remove it with heat, alcohol, petroleum jelly, or fingernail polish.  Use tweezers, curved forceps, or a tick-removal tool to grasp the tick. Gently pull up until the tick lets go. Do not twist or jerk the tick. Do not squeeze or crush the tick.  If you have symptoms, contact a doctor. This information is not intended to replace advice given to you by your health care provider. Make sure you discuss any questions you have with your health care provider. Document Released: 07/10/2009 Document  Revised: 07/26/2016 Document Reviewed: 07/26/2016 Elsevier Interactive Patient Education  2018 [employer].   IF you received an x-ray today, you will receive an invoice from [facility]. Please contact [facility] at [phone] with questions or concerns regarding your invoice.   IF you received labwork today, you will receive an invoice from [facility]. Please contact [facility] at [phone] with questions or concerns regarding your invoice.   Our billing staff will not be able to assist you with questions regarding bills from these companies.  You will be contacted with the lab results as soon as they are available. The fastest way to get your results is to activate your My Chart account. Instructions are located on the last page of this paperwork. If you have not heard from [place] regarding the results in 2 weeks, please contact this office.

## 2017-09-29 NOTE — Progress Notes (Addendum)
11/03/2017 at 12:27 PM  Kendra Taylor / DOB: 15-Oct-1945 / MRN: [id number]  The patient has Rheumatoid arthritis ([place]); HA (headache); Cough; Upper airway cough syndrome with classic voice fatigue >> doe ; Bronchiectasis without acute exacerbation ([place]); and Solitary pulmonary nodule on their problem list.  SUBJECTIVE  Kendra Taylor is a 72 y.o. female who complains of dysuria and urinary frequency x 2 days. She denies hematuria, flank pain, abdominal pain, genital rash, genital irritation and vaginal discharge. Has not tried anything with no relief. Just had a bidet nstalled and thinks that is part of the issue.   Also, concerned because she pulled a tick off of her right thigh 1 week ago and started feeling a little off a few days later. Has some mild headache, low grade fever, nausea, and fatigue. The spot where the tick was pulled off is itchy and red. No bulls eye rash noted. The tick was engorged when she pulled it off, does not know how long it was on there. Has PMH of RA and takes weekly methotrexate, always has joint pain, but nothing has worsened. Denies rash, abdominal pain, vomiting, confusion, chills, and visual disturbances.No hx of MRSA   She  has a past medical history of Anemia, Anxiety, Arthritis, Depression, Dyspnea, GERD (gastroesophageal reflux disease), HA (headache), Migraine, and Sleep apnea.    Medications reviewed and updated by myself where necessary, and exist elsewhere in the encounter.   Kendra Taylor is allergic to aspirin. She  reports that she has never smoked. She has never used smokeless tobacco. She reports that she does not drink alcohol or use drugs. She  has no sexual activity history on file. The patient  has a past surgical history that includes Abdominal hysterectomy; Oophorectomy; Bunionectomy; Finger surgery; Finger arthroplasty (Right, 07/30/2012); Joint replacement; Metatarsal head excision (Left, 03/03/2014); Hammer toe surgery (Left, 03/03/2014);  Video bronchoscopy (Bilateral, 12/18/2016); and Video bronchoscopy with endobronchial navigation (Left, 02/26/2017).  Her family history includes Dementia in her father and mother; Hyperthyroidism in her mother; Sleep apnea in her father and mother.  Review of Systems  Constitutional: Negative for diaphoresis.  Respiratory: Negative for cough.   Cardiovascular: Negative for chest pain and palpitations.  Neurological: Negative for dizziness and tingling.     OBJECTIVE  Her  height is 5' 2.4" (1.585 m) and weight is 124 lb 12.8 oz (56.6 kg). Her oral temperature is 99.4 F (37.4 C). Her blood pressure is 102/60 and her pulse is 85. Her oxygen saturation is 94%.  The patient's body mass index is 22.53 kg/m.  Physical Exam  Constitutional: She is oriented to person, place, and time. She appears well-developed and well-nourished. No distress.  HENT:  Head: Normocephalic and atraumatic.  Eyes: Conjunctivae are normal.  Neck: Normal range of motion.  Cardiovascular: Normal rate, regular rhythm and normal heart sounds.  Pulmonary/Chest: Effort normal and breath sounds normal.  Abdominal: Soft. Normal appearance. There is no tenderness. There is no CVA tenderness.  Neurological: She is alert and oriented to person, place, and time.  Skin: Skin is warm and dry.  Affected area where tick was removed shows ~1.5cm area of induration with overlying erythema, noted on right upper middle thigh. No pustule or fluctuance noted. No TTP.  No rash noted.   Psychiatric: She has a normal mood and affect.  Vitals reviewed.   No results found for this or any previous visit (from the past 24 hour(s)).  ASSESSMENT & PLAN This case was precepted  with Dr. Tamala Julian.  Kendra Taylor was seen today for tick removal and polyuria.  Diagnoses and all orders for this visit:  Fatigue, unspecified type -     B. burgdorfi antibodies -     Rocky mtn spotted fvr abs pnl(IgG+IgM)  Nonintractable headache, unspecified  chronicity pattern, unspecified headache type -     B. burgdorfi antibodies -     Rocky mtn spotted fvr abs pnl(IgG+IgM)  Urinary frequency -     POCT urinalysis dipstick  Tick bite, initial encounter -     B. burgdorfi antibodies -     Rocky mtn spotted fvr abs pnl(IgG+IgM) -     doxycycline (VIBRAMYCIN) 100 MG capsule; Take 1 capsule (100 mg total) by mouth 2 (two) times daily.  Leukocytes in urine -     Urine Culture -     doxycycline (VIBRAMYCIN) 100 MG capsule; Take 1 capsule (100 mg total) by mouth 2 (two) times daily.    Pt is overall well appearing, NAD. Vitals stable. Hx and UA uggestive of UTI. Will treat empirically at this time. Urine cx pending. Due to hx of tick bite, removing engorged tick, and vague systemic sx, plan to treat empircally for tick borne illness with doxycycline, which will cover most common UTI organisms as well. Pt is on methotrexate for RA. Educated pt that doxy can increase levels of methotrexate in system. Educated on potential signs/sx of methotrexate toxicity. Encouraged her to contact her rheumatologist to notify them of adding on doxy.  The patient was advised to call or come back to clinic if she does not see an improvement in symptoms, or worsens with the above plan.   Side effects, risks, benefits, and alternatives of the medications and treatment plan prescribed today were discussed, and patient expressed understanding of the instructions given. No barriers to understanding were identified. Red flags discussed in detail. Pt expressed understanding regarding what to do in case of emergency/urgent symptoms.   Tenna Delaine, PA-C  [facility] 11/03/2017 12:27 PM

## 2017-09-30 LAB — URINE CULTURE

## 2017-10-01 ENCOUNTER — Encounter: Payer: Self-pay | Admitting: Physician Assistant

## 2017-10-01 LAB — B. BURGDORFI ANTIBODIES: Lyme IgG/IgM Ab: <0.91 {ISR} (ref 0.00–0.90)

## 2017-10-01 LAB — ROCKY MTN SPOTTED FVR ABS PNL(IGG+IGM)
RMSF IgG: NEGATIVE
RMSF IgM: 2.36 index — ABNORMAL HIGH (ref 0.00–0.89)

## 2017-10-09 ENCOUNTER — Telehealth: Payer: Self-pay

## 2017-10-09 NOTE — Telephone Encounter (Signed)
Copied from [place] [id number]. Topic: Inquiry >> Oct 08, 2017  3:40 PM Mylinda Latina, NT wrote: Reason for CRM:  Patient is calling in reference to lab results done on 09/29/17  CB # [phone]  Called pt - gave message that was sent in [place] states she has been traveling and forgot her password.  Gave her the phone number for IT to retrieve her password.   Pt verbalized understanding to make an appointment in 2-4 weeks for re testing of titers.

## 2017-10-23 DIAGNOSIS — M0589 Other rheumatoid arthritis with rheumatoid factor of multiple sites: Secondary | ICD-10-CM | POA: Diagnosis not present

## 2017-12-16 DIAGNOSIS — M0589 Other rheumatoid arthritis with rheumatoid factor of multiple sites: Secondary | ICD-10-CM | POA: Diagnosis not present

## 2017-12-16 DIAGNOSIS — Z79899 Other long term (current) drug therapy: Secondary | ICD-10-CM | POA: Diagnosis not present

## 2017-12-17 DIAGNOSIS — K219 Gastro-esophageal reflux disease without esophagitis: Secondary | ICD-10-CM | POA: Diagnosis not present

## 2017-12-17 DIAGNOSIS — M8580 Other specified disorders of bone density and structure, unspecified site: Secondary | ICD-10-CM | POA: Diagnosis not present

## 2017-12-17 DIAGNOSIS — Z79899 Other long term (current) drug therapy: Secondary | ICD-10-CM | POA: Diagnosis not present

## 2017-12-17 DIAGNOSIS — M069 Rheumatoid arthritis, unspecified: Secondary | ICD-10-CM | POA: Diagnosis not present

## 2018-02-03 DIAGNOSIS — M0589 Other rheumatoid arthritis with rheumatoid factor of multiple sites: Secondary | ICD-10-CM | POA: Diagnosis not present

## 2018-04-01 DIAGNOSIS — L603 Nail dystrophy: Secondary | ICD-10-CM | POA: Diagnosis not present

## 2018-04-01 DIAGNOSIS — L219 Seborrheic dermatitis, unspecified: Secondary | ICD-10-CM | POA: Diagnosis not present

## 2018-04-01 DIAGNOSIS — D229 Melanocytic nevi, unspecified: Secondary | ICD-10-CM | POA: Diagnosis not present

## 2018-04-07 DIAGNOSIS — M0589 Other rheumatoid arthritis with rheumatoid factor of multiple sites: Secondary | ICD-10-CM | POA: Diagnosis not present

## 2018-04-08 DIAGNOSIS — D3131 Benign neoplasm of right choroid: Secondary | ICD-10-CM | POA: Diagnosis not present

## 2018-04-08 DIAGNOSIS — H1013 Acute atopic conjunctivitis, bilateral: Secondary | ICD-10-CM | POA: Diagnosis not present

## 2018-04-24 DIAGNOSIS — M8580 Other specified disorders of bone density and structure, unspecified site: Secondary | ICD-10-CM | POA: Diagnosis not present

## 2018-04-24 DIAGNOSIS — M1990 Unspecified osteoarthritis, unspecified site: Secondary | ICD-10-CM | POA: Diagnosis not present

## 2018-04-24 DIAGNOSIS — M069 Rheumatoid arthritis, unspecified: Secondary | ICD-10-CM | POA: Diagnosis not present

## 2018-04-24 DIAGNOSIS — Z79899 Other long term (current) drug therapy: Secondary | ICD-10-CM | POA: Diagnosis not present

## 2018-04-24 DIAGNOSIS — K219 Gastro-esophageal reflux disease without esophagitis: Secondary | ICD-10-CM | POA: Diagnosis not present

## 2018-04-24 DIAGNOSIS — M7710 Lateral epicondylitis, unspecified elbow: Secondary | ICD-10-CM | POA: Diagnosis not present

## 2018-05-26 DIAGNOSIS — M0589 Other rheumatoid arthritis with rheumatoid factor of multiple sites: Secondary | ICD-10-CM | POA: Diagnosis not present

## 2018-07-14 DIAGNOSIS — M0589 Other rheumatoid arthritis with rheumatoid factor of multiple sites: Secondary | ICD-10-CM | POA: Diagnosis not present

## 2018-07-24 DIAGNOSIS — M8580 Other specified disorders of bone density and structure, unspecified site: Secondary | ICD-10-CM | POA: Diagnosis not present

## 2018-07-24 DIAGNOSIS — M7710 Lateral epicondylitis, unspecified elbow: Secondary | ICD-10-CM | POA: Diagnosis not present

## 2018-07-24 DIAGNOSIS — K219 Gastro-esophageal reflux disease without esophagitis: Secondary | ICD-10-CM | POA: Diagnosis not present

## 2018-07-24 DIAGNOSIS — Z79899 Other long term (current) drug therapy: Secondary | ICD-10-CM | POA: Diagnosis not present

## 2018-07-24 DIAGNOSIS — M1990 Unspecified osteoarthritis, unspecified site: Secondary | ICD-10-CM | POA: Diagnosis not present

## 2018-07-24 DIAGNOSIS — M069 Rheumatoid arthritis, unspecified: Secondary | ICD-10-CM | POA: Diagnosis not present

## 2018-07-24 DIAGNOSIS — R1013 Epigastric pain: Secondary | ICD-10-CM | POA: Diagnosis not present

## 2018-08-05 DIAGNOSIS — H1013 Acute atopic conjunctivitis, bilateral: Secondary | ICD-10-CM | POA: Diagnosis not present

## 2018-09-01 DIAGNOSIS — M0589 Other rheumatoid arthritis with rheumatoid factor of multiple sites: Secondary | ICD-10-CM | POA: Diagnosis not present

## 2018-10-13 DIAGNOSIS — R079 Chest pain, unspecified: Secondary | ICD-10-CM | POA: Diagnosis not present

## 2018-10-13 DIAGNOSIS — S0031XA Abrasion of nose, initial encounter: Secondary | ICD-10-CM | POA: Diagnosis not present

## 2018-10-13 DIAGNOSIS — K219 Gastro-esophageal reflux disease without esophagitis: Secondary | ICD-10-CM | POA: Diagnosis not present

## 2018-10-13 DIAGNOSIS — M069 Rheumatoid arthritis, unspecified: Secondary | ICD-10-CM | POA: Diagnosis not present

## 2018-10-13 DIAGNOSIS — M1990 Unspecified osteoarthritis, unspecified site: Secondary | ICD-10-CM | POA: Diagnosis not present

## 2018-10-13 DIAGNOSIS — N644 Mastodynia: Secondary | ICD-10-CM | POA: Diagnosis not present

## 2018-10-15 ENCOUNTER — Other Ambulatory Visit: Payer: Self-pay | Admitting: Internal Medicine

## 2018-10-15 DIAGNOSIS — N644 Mastodynia: Secondary | ICD-10-CM

## 2018-10-20 DIAGNOSIS — M0589 Other rheumatoid arthritis with rheumatoid factor of multiple sites: Secondary | ICD-10-CM | POA: Diagnosis not present

## 2018-10-22 ENCOUNTER — Ambulatory Visit
Admission: RE | Admit: 2018-10-22 | Discharge: 2018-10-22 | Disposition: A | Discharge status: Home / Self Care | Payer: MEDICARE | Source: Ambulatory Visit | Attending: Internal Medicine | Admitting: Internal Medicine

## 2018-10-22 ENCOUNTER — Other Ambulatory Visit: Payer: Self-pay

## 2018-10-22 DIAGNOSIS — N644 Mastodynia: Secondary | ICD-10-CM

## 2018-10-22 DIAGNOSIS — R922 Inconclusive mammogram: Secondary | ICD-10-CM | POA: Diagnosis not present

## 2018-11-24 DIAGNOSIS — M79641 Pain in right hand: Secondary | ICD-10-CM | POA: Diagnosis not present

## 2018-11-24 DIAGNOSIS — M1990 Unspecified osteoarthritis, unspecified site: Secondary | ICD-10-CM | POA: Diagnosis not present

## 2018-11-24 DIAGNOSIS — K219 Gastro-esophageal reflux disease without esophagitis: Secondary | ICD-10-CM | POA: Diagnosis not present

## 2018-11-24 DIAGNOSIS — M069 Rheumatoid arthritis, unspecified: Secondary | ICD-10-CM | POA: Diagnosis not present

## 2018-11-24 DIAGNOSIS — M7710 Lateral epicondylitis, unspecified elbow: Secondary | ICD-10-CM | POA: Diagnosis not present

## 2018-11-24 DIAGNOSIS — M8580 Other specified disorders of bone density and structure, unspecified site: Secondary | ICD-10-CM | POA: Diagnosis not present

## 2018-11-24 DIAGNOSIS — Z79899 Other long term (current) drug therapy: Secondary | ICD-10-CM | POA: Diagnosis not present

## 2018-11-24 DIAGNOSIS — M79642 Pain in left hand: Secondary | ICD-10-CM | POA: Diagnosis not present

## 2018-11-30 ENCOUNTER — Other Ambulatory Visit: Payer: Self-pay

## 2018-12-08 DIAGNOSIS — M0589 Other rheumatoid arthritis with rheumatoid factor of multiple sites: Secondary | ICD-10-CM | POA: Diagnosis not present

## 2019-01-05 ENCOUNTER — Other Ambulatory Visit: Payer: Self-pay | Admitting: *Deleted

## 2019-01-05 DIAGNOSIS — R6889 Other general symptoms and signs: Secondary | ICD-10-CM | POA: Diagnosis not present

## 2019-01-05 DIAGNOSIS — Z20822 Contact with and (suspected) exposure to covid-19: Secondary | ICD-10-CM

## 2019-01-07 LAB — NOVEL CORONAVIRUS, NAA: SARS-CoV-2, NAA: NOT DETECTED

## 2019-01-08 DIAGNOSIS — Z23 Encounter for immunization: Secondary | ICD-10-CM | POA: Diagnosis not present

## 2019-01-26 DIAGNOSIS — M0589 Other rheumatoid arthritis with rheumatoid factor of multiple sites: Secondary | ICD-10-CM | POA: Diagnosis not present

## 2019-02-11 DIAGNOSIS — H10413 Chronic giant papillary conjunctivitis, bilateral: Secondary | ICD-10-CM | POA: Diagnosis not present

## 2019-02-24 DIAGNOSIS — M069 Rheumatoid arthritis, unspecified: Secondary | ICD-10-CM | POA: Diagnosis not present

## 2019-02-24 DIAGNOSIS — M25572 Pain in left ankle and joints of left foot: Secondary | ICD-10-CM | POA: Diagnosis not present

## 2019-02-24 DIAGNOSIS — Z79899 Other long term (current) drug therapy: Secondary | ICD-10-CM | POA: Diagnosis not present

## 2019-02-24 DIAGNOSIS — M1990 Unspecified osteoarthritis, unspecified site: Secondary | ICD-10-CM | POA: Diagnosis not present

## 2019-02-24 DIAGNOSIS — K219 Gastro-esophageal reflux disease without esophagitis: Secondary | ICD-10-CM | POA: Diagnosis not present

## 2019-02-24 DIAGNOSIS — M8580 Other specified disorders of bone density and structure, unspecified site: Secondary | ICD-10-CM | POA: Diagnosis not present

## 2019-02-24 DIAGNOSIS — M79642 Pain in left hand: Secondary | ICD-10-CM | POA: Diagnosis not present

## 2019-03-29 DIAGNOSIS — M0589 Other rheumatoid arthritis with rheumatoid factor of multiple sites: Secondary | ICD-10-CM | POA: Diagnosis not present

## 2019-04-01 ENCOUNTER — Ambulatory Visit (INDEPENDENT_AMBULATORY_CARE_PROVIDER_SITE_OTHER): Payer: MEDICARE | Admitting: Podiatry

## 2019-04-01 ENCOUNTER — Other Ambulatory Visit: Payer: Self-pay

## 2019-04-01 DIAGNOSIS — L600 Ingrowing nail: Secondary | ICD-10-CM

## 2019-04-01 DIAGNOSIS — D229 Melanocytic nevi, unspecified: Secondary | ICD-10-CM | POA: Diagnosis not present

## 2019-04-01 DIAGNOSIS — M79675 Pain in left toe(s): Secondary | ICD-10-CM | POA: Diagnosis not present

## 2019-04-01 DIAGNOSIS — M216X1 Other acquired deformities of right foot: Secondary | ICD-10-CM | POA: Diagnosis not present

## 2019-04-01 DIAGNOSIS — L603 Nail dystrophy: Secondary | ICD-10-CM

## 2019-04-01 DIAGNOSIS — L219 Seborrheic dermatitis, unspecified: Secondary | ICD-10-CM | POA: Diagnosis not present

## 2019-04-01 DIAGNOSIS — M76829 Posterior tibial tendinitis, unspecified leg: Secondary | ICD-10-CM | POA: Diagnosis not present

## 2019-04-01 NOTE — Patient Instructions (Signed)

## 2019-04-02 NOTE — Progress Notes (Signed)
Subjective:   Patient ID: Kendra Taylor, female   DOB: 74 y.o.   MRN: [id number]   HPI 73 year old female presents the office today for concerns of her left big toenail becoming very thick and discolored and becoming ingrown.  She said this is been ongoing for several years.  She has some occasional discomfort but denies any drainage or pus.  She developed a new blister on the side of the big toe she not sure why.  She has not had any drainage or pus coming from the toe.  No recent treatment.  She is also concerned because she feels that the right side is willing pain when she walks.  She has some occasional discomfort she points the medial aspect of foot this is a secondary concern.  Is been a chronic issue denies any recent injury.  She has no other concerns.   Review of Systems  All other systems reviewed and are negative.  Past Medical History:  Diagnosis Date  . Anemia   . Anxiety   . Arthritis   . Depression   . Dyspnea   . GERD (gastroesophageal reflux disease)   . HA (headache)   . Migraine   . Sleep apnea    cpap    Past Surgical History:  Procedure Laterality Date  . ABDOMINAL HYSTERECTOMY    . BUNIONECTOMY     both  . FINGER ARTHROPLASTY Right 07/30/2012   Procedure: RIGHT INDEX FINGER, MIDDLE FINGER, RING FINGER, SMALL FINGER MCP ARTHROPLASTY WITH REALIGNMENT/REPAIR OF EXTENSOR TENDON;  Surgeon: Roseanne Kaufman, MD;  Location: [facility];  Service: Orthopedics;  Laterality: Right;  . FINGER SURGERY    . HAMMER TOE SURGERY Left 03/03/2014   Procedure: SECOND THROUGH FIFTH HAMMERTOE CORRECTION;  Surgeon: Wylene Simmer, MD;  Location: [place];  Service: Orthopedics;  Laterality: Left;  . JOINT REPLACEMENT    . METATARSAL HEAD EXCISION Left 03/03/2014   Procedure: LEFT SECOND THROUGH FIFTH METATARSAL HEAD EXCISION;  Surgeon: Wylene Simmer, MD;  Location: [place];  Service: Orthopedics;  Laterality: Left;  . OOPHORECTOMY    . VIDEO  BRONCHOSCOPY Bilateral 12/18/2016   Procedure: VIDEO BRONCHOSCOPY WITH FLUORO;  Surgeon: Tanda Rockers, MD;  Location: [facility];  Service: Cardiopulmonary;  Laterality: Bilateral;  . VIDEO BRONCHOSCOPY WITH ENDOBRONCHIAL NAVIGATION Left 02/26/2017   Procedure: VIDEO BRONCHOSCOPY WITH ENDOBRONCHIAL NAVIGATION;  Surgeon: Collene Gobble, MD;  Location: [facility];  Service: Thoracic;  Laterality: Left;     Current Outpatient Medications:  .  alendronate (FOSAMAX) 70 MG tablet, TK 1 T PO ONCE A WEEK FOR 28 DAYS, Disp: , Rfl: 3 .  CALCIUM CARBONATE PO, Take 1,000 mg by mouth daily., Disp: , Rfl:  .  Cholecalciferol (VITAMIN D) 2000 units tablet, Take 2,000 Units by mouth daily. , Disp: , Rfl:  .  doxycycline (VIBRAMYCIN) 100 MG capsule, Take 1 capsule (100 mg total) by mouth 2 (two) times daily., Disp: 20 capsule, Rfl: 7 .  folic acid (FOLVITE) 1 MG tablet, Take 1 mg by mouth daily.  , Disp: , Rfl:  .  inFLIXimab (REMICADE) 100 MG injection, Inject into the vein every 8 (eight) weeks. , Disp: , Rfl:  .  levocetirizine (XYZAL) 5 MG tablet, Take 5 mg by mouth daily as needed for allergies. , Disp: , Rfl:  .  magnesium gluconate (MAGONATE) 500 MG tablet, Take 500 mg by mouth every other day. , Disp: , Rfl:  .  methotrexate (RHEUMATREX) 2.5 MG  tablet, TK 6 TS PO WEEKLY, Disp: , Rfl:  .  ondansetron (ZOFRAN-ODT) 4 MG disintegrating tablet, Take 4 mg by mouth every 8 (eight) hours as needed for nausea., Disp: , Rfl:  .  pantoprazole (PROTONIX) 20 MG tablet, Take 20 mg by mouth daily., Disp: , Rfl:  .  Polyethyl Glycol-Propyl Glycol (SYSTANE OP), Place 2 drops into both eyes as needed (for dry eyes)., Disp: , Rfl:  .  Potassium 99 MG TABS, Take 99 mg by mouth daily. , Disp: , Rfl:  .  ranitidine (ZANTAC) 150 MG tablet, Take 1 tablet (150 mg total) by mouth daily as needed for heartburn., Disp: , Rfl:  .  SUMAtriptan (IMITREX) 100 MG tablet, Take 100 mg by mouth every 2 (two) hours as needed for migraine.,  Disp: , Rfl:  .  triamcinolone cream (KENALOG) 0.1 %, Apply 1 application topically as needed (for skin irritation)., Disp: , Rfl:  .  vitamin B-12 (CYANOCOBALAMIN) 1000 MCG tablet, Take 1,000 mcg by mouth daily., Disp: , Rfl:  .  zolpidem (AMBIEN) 10 MG tablet, Take 10 mg by mouth at bedtime as needed for sleep., Disp: , Rfl:   Allergies  Allergen Reactions  . Aspirin Other (See Comments)    Ringing in ears with large doses, large doses causes her to lose hearing          Objective:  Physical Exam  General: AAO x3, NAD  Dermatological: Left hallux toenail significantly hypertrophic, dystrophic with brown discoloration and a transverse ridging of the nail.  There is incurvation of both medial lateral borders of the base.  Small old blister present off of the medial hallux just adjacent to the toenail.  There is no.  No ascending cellulitis but there is faint erythema on the base of the nail.  No drainage or pus.  Vascular: Dorsalis Pedis artery and Posterior Tibial artery pedal pulses are 2/4 bilateral with immedate capillary fill time.  There is no pain with calf compression, swelling, warmth, erythema.   Neruologic: Decrease in station medial hallux and left side but this is been chronic since her bunion surgery but otherwise sensation appears to be intact.  Musculoskeletal: There is mild pronation deformity present.  On the right side she points on the navicular tuberosity where she does get discomfort but not able to elicit any significant tenderness today.  No pain of the posterior tibial tendon or other tenderness.  Muscular strength 5/5 in all groups tested bilateral.  Gait: Unassisted, Nonantalgic.       Assessment:   Left hallux onychodystrophy, right foot pronation/RA     Plan:  -Treatment options discussed including all alternatives, risks, and complications -Etiology of symptoms were discussed -At this time, recommended total nail removal without chemical  matricectomy to the left hallux. She did not want a permanent removal at this time. Risks and complications were discussed with the patient for which they understand and  verbally consent to the procedure. Under sterile conditions a total of 3 mL of a mixture of 2% lidocaine plain and 0.5% Marcaine plain was infiltrated in a hallux block fashion. Once anesthetized, the skin was prepped in sterile fashion. A tourniquet was then applied. Next the left hallux nail border was excised in total making sure to remove the entire offending nail border. Once the nail was removed, the area was debrided and the underlying skin was intact. The area was irrigated and hemostasis was obtained.  A dry sterile dressing was applied. After application of the  dressing the tourniquet was removed and there is found to be an immediate capillary refill time to the digit. The patient tolerated the procedure well any complications. Post procedure instructions were discussed the patient for which he verbally understood. Follow-up in one week for nail check or sooner if any problems are to arise. Discussed signs/symptoms of worsening infection and directed to call the office immediately should any occur or go directly to the emergency room. In the meantime, encouraged to call the office with any questions, concerns, changes symptoms. -At next appointment she is also to follow-up with [facility] for inserts.  She had orthotics previously done well.  Return in about 2 weeks (around 04/15/2019) for left nail check.  Trula Slade DPM

## 2019-04-06 DIAGNOSIS — H5203 Hypermetropia, bilateral: Secondary | ICD-10-CM | POA: Diagnosis not present

## 2019-04-06 DIAGNOSIS — H10413 Chronic giant papillary conjunctivitis, bilateral: Secondary | ICD-10-CM | POA: Diagnosis not present

## 2019-04-06 DIAGNOSIS — H18513 Endothelial corneal dystrophy, bilateral: Secondary | ICD-10-CM | POA: Diagnosis not present

## 2019-04-06 DIAGNOSIS — D3131 Benign neoplasm of right choroid: Secondary | ICD-10-CM | POA: Diagnosis not present

## 2019-04-15 ENCOUNTER — Other Ambulatory Visit: Payer: Self-pay

## 2019-04-15 ENCOUNTER — Other Ambulatory Visit: Payer: MEDICARE | Admitting: Orthotics

## 2019-04-15 ENCOUNTER — Ambulatory Visit (INDEPENDENT_AMBULATORY_CARE_PROVIDER_SITE_OTHER): Payer: MEDICARE | Admitting: Podiatry

## 2019-04-15 DIAGNOSIS — M79675 Pain in left toe(s): Secondary | ICD-10-CM

## 2019-04-15 DIAGNOSIS — L603 Nail dystrophy: Secondary | ICD-10-CM

## 2019-04-15 NOTE — Patient Instructions (Signed)

## 2019-04-18 NOTE — Progress Notes (Signed)
Subjective: Kendra Taylor is a 73 y.o.  female returns to office today for follow up evaluation after having left Hallux total  nail avulsion performed. Patient has been soaking using epsom salts and applying topical antibiotic covered with bandaid daily.  She does report the Epson salt soaks did burn at first however she made the water more diluted made comfortable.  Denies any drainage or pus.  Patient denies fevers, chills, nausea, vomiting. Denies any calf pain, chest pain, SOB.   Objective:  Vitals: Reviewed  General: Well developed, nourished, in no acute distress, alert and oriented x3   Dermatology: Skin is warm, dry and supple bilateral. LEFT hallux nail bed appears to be clean, dry, with mild granular tissue and surrounding scab.  Faint rim of erythema but there is no warmth or any ascending cellulitis there is no drainage or pus.  This area of erythema seems to be where there was an old blister that skin came off and there is new skin present.  There is no surrounding erythema, edema, drainage/purulence. The remaining nails appear unremarkable at this time. There are no other lesions or other signs of infection present.  Neurovascular status: Intact. No lower extremity swelling; No pain with calf compression bilateral.  Musculoskeletal: Decreased tenderness to palpation of the left hallux nail bed. Muscular strength within normal limits bilateral.   Assesement and Plan: S/p partial nail avulsion, doing well.   -Continue soaking in epsom salts twice a day followed by antibiotic ointment and a band-aid. Can leave uncovered at night. Continue this until completely healed.  -If the area has not healed in 2 weeks, call the office for follow-up appointment, or sooner if any problems arise.  -Monitor for any signs/symptoms of infection. Call the office immediately if any occur or go directly to the emergency room. Call with any questions/concerns.  Celesta Gentile, DPM

## 2019-04-20 ENCOUNTER — Other Ambulatory Visit: Payer: Self-pay

## 2019-04-20 ENCOUNTER — Ambulatory Visit (INDEPENDENT_AMBULATORY_CARE_PROVIDER_SITE_OTHER): Payer: MEDICARE | Admitting: Orthotics

## 2019-04-20 DIAGNOSIS — M216X1 Other acquired deformities of right foot: Secondary | ICD-10-CM

## 2019-04-20 DIAGNOSIS — M76822 Posterior tibial tendinitis, left leg: Secondary | ICD-10-CM | POA: Diagnosis not present

## 2019-04-20 DIAGNOSIS — M79675 Pain in left toe(s): Secondary | ICD-10-CM

## 2019-04-20 DIAGNOSIS — M76829 Posterior tibial tendinitis, unspecified leg: Secondary | ICD-10-CM

## 2019-04-20 NOTE — Progress Notes (Signed)
Patient presents with history of PTTD/Pes Planus (acquired).  Patient demonstrated medial shift talus/drop navicular, and medial column collapse.   Plan is for CMFO w/ longitudinal arch support, rear foot stability to address eversion.  Fabrication will be with semi rigid/rigid poly pro shell, deep heel seat, wide, padding under spenco cover. Plan on                                      To fabricate.

## 2019-05-17 DIAGNOSIS — M0589 Other rheumatoid arthritis with rheumatoid factor of multiple sites: Secondary | ICD-10-CM | POA: Diagnosis not present

## 2019-05-18 ENCOUNTER — Ambulatory Visit: Payer: MEDICARE | Admitting: Orthotics

## 2019-05-18 ENCOUNTER — Other Ambulatory Visit: Payer: Self-pay

## 2019-05-18 DIAGNOSIS — M76829 Posterior tibial tendinitis, unspecified leg: Secondary | ICD-10-CM

## 2019-05-18 DIAGNOSIS — M216X1 Other acquired deformities of right foot: Secondary | ICD-10-CM

## 2019-05-18 DIAGNOSIS — M79675 Pain in left toe(s): Secondary | ICD-10-CM

## 2019-05-18 NOTE — Progress Notes (Signed)
Patient came in today to pick up custom made foot orthotics.  The goals were accomplished and the patient reported no dissatisfaction with said orthotics.  Patient was advised of breakin period and how to report any issues. 

## 2019-05-25 ENCOUNTER — Ambulatory Visit: Payer: MEDICARE

## 2019-05-31 DIAGNOSIS — K219 Gastro-esophageal reflux disease without esophagitis: Secondary | ICD-10-CM | POA: Diagnosis not present

## 2019-05-31 DIAGNOSIS — M1990 Unspecified osteoarthritis, unspecified site: Secondary | ICD-10-CM | POA: Diagnosis not present

## 2019-05-31 DIAGNOSIS — M069 Rheumatoid arthritis, unspecified: Secondary | ICD-10-CM | POA: Diagnosis not present

## 2019-05-31 DIAGNOSIS — M8580 Other specified disorders of bone density and structure, unspecified site: Secondary | ICD-10-CM | POA: Diagnosis not present

## 2019-05-31 DIAGNOSIS — Z79899 Other long term (current) drug therapy: Secondary | ICD-10-CM | POA: Diagnosis not present

## 2019-06-03 ENCOUNTER — Ambulatory Visit: Payer: Medicare Other | Attending: Internal Medicine

## 2019-06-03 DIAGNOSIS — Z23 Encounter for immunization: Secondary | ICD-10-CM

## 2019-06-03 NOTE — Progress Notes (Signed)
   Covid-19 Vaccination Clinic  Name:  Kendra Taylor    MRN: [id number] DOB: May 11, 1945  06/03/2019  [name] was observed post Covid-19 immunization for 15 minutes without incidence. She was provided with Vaccine Information Sheet and instruction to access the V-Safe system.   Ms. Self was instructed to call 911 with any severe reactions post vaccine: Marland Kitchen Difficulty breathing  . Swelling of your face and throat  . A fast heartbeat  . A bad rash all over your body  . Dizziness and weakness    Immunizations Administered    Name Date Dose VIS Date Route   Pfizer COVID-19 Vaccine 06/03/2019  4:11 PM 0.3 mL 04/09/2019 Intramuscular   Manufacturer: [place]   Lot: [id number]   [place]: [address]-4

## 2019-06-15 ENCOUNTER — Ambulatory Visit: Payer: MEDICARE

## 2019-06-28 ENCOUNTER — Ambulatory Visit: Payer: Medicare Other | Attending: Internal Medicine

## 2019-06-28 DIAGNOSIS — Z23 Encounter for immunization: Secondary | ICD-10-CM | POA: Insufficient documentation

## 2019-06-28 NOTE — Progress Notes (Signed)
   Covid-19 Vaccination Clinic  Name:  Kendra Taylor    MRN: [id number] DOB: June 26, 1945  06/28/2019  [name] was observed post Covid-19 immunization for 15 minutes without incidence. She was provided with Vaccine Information Sheet and instruction to access the V-Safe system.   [name] was instructed to call 911 with any severe reactions post vaccine: Marland Kitchen Difficulty breathing  . Swelling of your face and throat  . A fast heartbeat  . A bad rash all over your body  . Dizziness and weakness    Immunizations Administered    Name Date Dose VIS Date Route   Pfizer COVID-19 Vaccine 06/28/2019  1:35 PM 0.3 mL 04/09/2019 Intramuscular   Manufacturer: [place]   Lot: [id number]   [place]: [address]-8

## 2019-07-05 DIAGNOSIS — M0589 Other rheumatoid arthritis with rheumatoid factor of multiple sites: Secondary | ICD-10-CM | POA: Diagnosis not present

## 2019-08-23 DIAGNOSIS — M0589 Other rheumatoid arthritis with rheumatoid factor of multiple sites: Secondary | ICD-10-CM | POA: Diagnosis not present

## 2019-08-31 ENCOUNTER — Ambulatory Visit (INDEPENDENT_AMBULATORY_CARE_PROVIDER_SITE_OTHER): Payer: MEDICARE | Admitting: Physician Assistant

## 2019-08-31 ENCOUNTER — Encounter: Payer: Self-pay | Admitting: Physician Assistant

## 2019-08-31 ENCOUNTER — Other Ambulatory Visit: Payer: Self-pay

## 2019-08-31 DIAGNOSIS — L720 Epidermal cyst: Secondary | ICD-10-CM

## 2019-08-31 DIAGNOSIS — L219 Seborrheic dermatitis, unspecified: Secondary | ICD-10-CM

## 2019-08-31 NOTE — Progress Notes (Addendum)
   Follow-Up Visit   Subjective  Kendra Taylor is a 74 y.o. female who presents for the following: Skin Problem (Patient here today for bump on right lower lip x months no bleeding spot was gray.  Also patient has a question about what you told her to do for the rash thats on her forehead).  Location: left ear, right outer mouth Duration: months Quality: hard bump Associated Signs/Symptoms: none Modifying Factors: persistent Severity: growing   The following portions of the chart were reviewed this encounter and updated as appropriate: Tobacco  Allergies  Meds  Problems  Med Hx  Surg Hx  Fam Hx      Objective  Well appearing patient in no apparent distress; mood and affect are within normal limits.  A focused examination was performed including face and ears. Relevant physical exam findings are noted in the Assessment and Plan.  Objective  Left Concha, Right Lower Cutaneous Lip: Grey papule  Objective  Glabella, Left Buccal Cheek , Left Eyebrow, Right Eyebrow, Right Melolabial Fold: Erythematous plaques with greasy scale.   Assessment & Plan  Milium cyst (2) Left Concha; Right Lower Cutaneous Lip  observe  Seborrheic dermatitis (5) Left Eyebrow; Right Eyebrow; Glabella; Right Melolabial Fold; Left Buccal Cheek   Continue hczn and otc clotrimazole

## 2019-09-02 ENCOUNTER — Encounter: Payer: Self-pay | Admitting: Physician Assistant

## 2019-10-11 DIAGNOSIS — M0589 Other rheumatoid arthritis with rheumatoid factor of multiple sites: Secondary | ICD-10-CM | POA: Diagnosis not present

## 2019-11-29 DIAGNOSIS — M0589 Other rheumatoid arthritis with rheumatoid factor of multiple sites: Secondary | ICD-10-CM | POA: Diagnosis not present

## 2019-12-21 DIAGNOSIS — B029 Zoster without complications: Secondary | ICD-10-CM | POA: Diagnosis not present

## 2019-12-28 DIAGNOSIS — B029 Zoster without complications: Secondary | ICD-10-CM | POA: Diagnosis not present

## 2020-01-18 DIAGNOSIS — M0589 Other rheumatoid arthritis with rheumatoid factor of multiple sites: Secondary | ICD-10-CM | POA: Diagnosis not present

## 2020-01-21 DIAGNOSIS — Z23 Encounter for immunization: Secondary | ICD-10-CM | POA: Diagnosis not present

## 2020-02-08 ENCOUNTER — Ambulatory Visit: Payer: MEDICARE | Attending: Internal Medicine

## 2020-02-08 DIAGNOSIS — Z23 Encounter for immunization: Secondary | ICD-10-CM

## 2020-02-08 NOTE — Progress Notes (Signed)
   Covid-19 Vaccination Clinic  Name:  Kendra Taylor    MRN: [id number] DOB: May 15, 1945  02/08/2020  Kendra Taylor was observed post Covid-19 immunization for 15 minutes without incident. She was provided with Vaccine Information Sheet and instruction to access the V-Safe system.   Kendra Taylor was instructed to call 911 with any severe reactions post vaccine: Marland Kitchen Difficulty breathing  . Swelling of face and throat  . A fast heartbeat  . A bad rash all over body  . Dizziness and weakness

## 2020-02-14 ENCOUNTER — Other Ambulatory Visit: Payer: Self-pay

## 2020-02-14 ENCOUNTER — Ambulatory Visit (INDEPENDENT_AMBULATORY_CARE_PROVIDER_SITE_OTHER): Payer: MEDICARE | Admitting: Dermatology

## 2020-02-14 ENCOUNTER — Encounter: Payer: Self-pay | Admitting: Dermatology

## 2020-02-14 DIAGNOSIS — D239 Other benign neoplasm of skin, unspecified: Secondary | ICD-10-CM

## 2020-02-14 DIAGNOSIS — Z86018 Personal history of other benign neoplasm: Secondary | ICD-10-CM

## 2020-02-14 DIAGNOSIS — D2372 Other benign neoplasm of skin of left lower limb, including hip: Secondary | ICD-10-CM

## 2020-02-14 DIAGNOSIS — L720 Epidermal cyst: Secondary | ICD-10-CM

## 2020-02-14 DIAGNOSIS — Z8619 Personal history of other infectious and parasitic diseases: Secondary | ICD-10-CM

## 2020-02-14 DIAGNOSIS — Z87898 Personal history of other specified conditions: Secondary | ICD-10-CM

## 2020-02-14 DIAGNOSIS — Z85828 Personal history of other malignant neoplasm of skin: Secondary | ICD-10-CM

## 2020-02-14 DIAGNOSIS — Z1283 Encounter for screening for malignant neoplasm of skin: Secondary | ICD-10-CM

## 2020-02-14 NOTE — Progress Notes (Signed)
History on shingles right buttock years ago.

## 2020-02-14 NOTE — Progress Notes (Signed)
LEFT SHIN NOn HEALING LESION

## 2020-02-17 NOTE — Progress Notes (Signed)
   Follow-Up Visit   Subjective  Kendra Taylor is a 74 y.o. female who presents for the following: Skin Problem (GROWTH ON LEFT X 1 YEAR SKIN TAG?).  Follow up Location:  Duration:  Quality:  Associated Signs/Symptoms: Modifying Factors:  Severity:  Timing: Context:   Objective  Well appearing patient in no apparent distress; mood and affect are within normal limits.  All skin waist up examined.   Assessment & Plan    History of atypical nevus Right Forearm - Anterior  Lesion clear.  Self examine her skin twice yearly  History of basal cell cancer Right Antihelix  Annual recheck  Milia Left Superior Crus of Antihelix  No treatment necessary.   History of shingles Right Hip (side) - Posterior  No intervention indicated  Dermatofibroma Left Lower Leg - Anterior  No treatment needed as long as its stable.   Screening for malignant neoplasm of skin Mid Back  Follow up with yearly skin exams.      I, Lavonna Monarch, MD, have reviewed all documentation for this visit.  The documentation on 03/24/20 for the exam, diagnosis, procedures, and orders are all accurate and complete.

## 2020-02-24 DIAGNOSIS — K219 Gastro-esophageal reflux disease without esophagitis: Secondary | ICD-10-CM | POA: Diagnosis not present

## 2020-02-24 DIAGNOSIS — M069 Rheumatoid arthritis, unspecified: Secondary | ICD-10-CM | POA: Diagnosis not present

## 2020-02-24 DIAGNOSIS — M7070 Other bursitis of hip, unspecified hip: Secondary | ICD-10-CM | POA: Diagnosis not present

## 2020-02-24 DIAGNOSIS — M79676 Pain in unspecified toe(s): Secondary | ICD-10-CM | POA: Diagnosis not present

## 2020-02-24 DIAGNOSIS — Z79899 Other long term (current) drug therapy: Secondary | ICD-10-CM | POA: Diagnosis not present

## 2020-02-24 DIAGNOSIS — M8580 Other specified disorders of bone density and structure, unspecified site: Secondary | ICD-10-CM | POA: Diagnosis not present

## 2020-02-24 DIAGNOSIS — M1990 Unspecified osteoarthritis, unspecified site: Secondary | ICD-10-CM | POA: Diagnosis not present

## 2020-03-14 DIAGNOSIS — M0589 Other rheumatoid arthritis with rheumatoid factor of multiple sites: Secondary | ICD-10-CM | POA: Diagnosis not present

## 2020-03-24 ENCOUNTER — Encounter: Payer: Self-pay | Admitting: Dermatology

## 2020-05-09 DIAGNOSIS — H10413 Chronic giant papillary conjunctivitis, bilateral: Secondary | ICD-10-CM | POA: Diagnosis not present

## 2020-05-10 DIAGNOSIS — M0589 Other rheumatoid arthritis with rheumatoid factor of multiple sites: Secondary | ICD-10-CM | POA: Diagnosis not present

## 2020-05-16 DIAGNOSIS — H10413 Chronic giant papillary conjunctivitis, bilateral: Secondary | ICD-10-CM | POA: Diagnosis not present

## 2020-05-24 DIAGNOSIS — K219 Gastro-esophageal reflux disease without esophagitis: Secondary | ICD-10-CM | POA: Diagnosis not present

## 2020-05-24 DIAGNOSIS — M8580 Other specified disorders of bone density and structure, unspecified site: Secondary | ICD-10-CM | POA: Diagnosis not present

## 2020-05-24 DIAGNOSIS — M1990 Unspecified osteoarthritis, unspecified site: Secondary | ICD-10-CM | POA: Diagnosis not present

## 2020-05-24 DIAGNOSIS — M7070 Other bursitis of hip, unspecified hip: Secondary | ICD-10-CM | POA: Diagnosis not present

## 2020-05-24 DIAGNOSIS — M069 Rheumatoid arthritis, unspecified: Secondary | ICD-10-CM | POA: Diagnosis not present

## 2020-05-24 DIAGNOSIS — Z79899 Other long term (current) drug therapy: Secondary | ICD-10-CM | POA: Diagnosis not present

## 2020-05-24 DIAGNOSIS — M79676 Pain in unspecified toe(s): Secondary | ICD-10-CM | POA: Diagnosis not present

## 2020-05-30 DIAGNOSIS — D3131 Benign neoplasm of right choroid: Secondary | ICD-10-CM | POA: Diagnosis not present

## 2020-05-30 DIAGNOSIS — H5203 Hypermetropia, bilateral: Secondary | ICD-10-CM | POA: Diagnosis not present

## 2020-05-30 DIAGNOSIS — H2513 Age-related nuclear cataract, bilateral: Secondary | ICD-10-CM | POA: Diagnosis not present

## 2020-06-28 DIAGNOSIS — M0589 Other rheumatoid arthritis with rheumatoid factor of multiple sites: Secondary | ICD-10-CM | POA: Diagnosis not present

## 2020-07-25 DIAGNOSIS — Z0000 Encounter for general adult medical examination without abnormal findings: Secondary | ICD-10-CM | POA: Diagnosis not present

## 2020-07-25 DIAGNOSIS — E559 Vitamin D deficiency, unspecified: Secondary | ICD-10-CM | POA: Diagnosis not present

## 2020-07-25 DIAGNOSIS — E7800 Pure hypercholesterolemia, unspecified: Secondary | ICD-10-CM | POA: Diagnosis not present

## 2020-07-25 DIAGNOSIS — M818 Other osteoporosis without current pathological fracture: Secondary | ICD-10-CM | POA: Diagnosis not present

## 2020-07-25 DIAGNOSIS — Z79899 Other long term (current) drug therapy: Secondary | ICD-10-CM | POA: Diagnosis not present

## 2020-07-31 DIAGNOSIS — J019 Acute sinusitis, unspecified: Secondary | ICD-10-CM | POA: Diagnosis not present

## 2020-07-31 DIAGNOSIS — Z0000 Encounter for general adult medical examination without abnormal findings: Secondary | ICD-10-CM | POA: Diagnosis not present

## 2020-07-31 DIAGNOSIS — M8580 Other specified disorders of bone density and structure, unspecified site: Secondary | ICD-10-CM | POA: Diagnosis not present

## 2020-07-31 DIAGNOSIS — Z780 Asymptomatic menopausal state: Secondary | ICD-10-CM | POA: Diagnosis not present

## 2020-07-31 DIAGNOSIS — J309 Allergic rhinitis, unspecified: Secondary | ICD-10-CM | POA: Diagnosis not present

## 2020-07-31 DIAGNOSIS — N390 Urinary tract infection, site not specified: Secondary | ICD-10-CM | POA: Diagnosis not present

## 2020-07-31 DIAGNOSIS — Z79899 Other long term (current) drug therapy: Secondary | ICD-10-CM | POA: Diagnosis not present

## 2020-07-31 DIAGNOSIS — R079 Chest pain, unspecified: Secondary | ICD-10-CM | POA: Diagnosis not present

## 2020-07-31 DIAGNOSIS — Z01419 Encounter for gynecological examination (general) (routine) without abnormal findings: Secondary | ICD-10-CM | POA: Diagnosis not present

## 2020-07-31 DIAGNOSIS — K219 Gastro-esophageal reflux disease without esophagitis: Secondary | ICD-10-CM | POA: Diagnosis not present

## 2020-07-31 DIAGNOSIS — M0589 Other rheumatoid arthritis with rheumatoid factor of multiple sites: Secondary | ICD-10-CM | POA: Diagnosis not present

## 2020-07-31 DIAGNOSIS — Z1212 Encounter for screening for malignant neoplasm of rectum: Secondary | ICD-10-CM | POA: Diagnosis not present

## 2020-08-14 DIAGNOSIS — Z1211 Encounter for screening for malignant neoplasm of colon: Secondary | ICD-10-CM | POA: Diagnosis not present

## 2020-08-14 DIAGNOSIS — Z1212 Encounter for screening for malignant neoplasm of rectum: Secondary | ICD-10-CM | POA: Diagnosis not present

## 2020-08-17 DIAGNOSIS — M0589 Other rheumatoid arthritis with rheumatoid factor of multiple sites: Secondary | ICD-10-CM | POA: Diagnosis not present

## 2020-08-21 NOTE — Progress Notes (Signed)
Date:  08/22/2020   ID:  Saffron, Busey 26-Nov-1945, MRN [id number]  PCP:  Deland Pretty, MD  Cardiologist:  Rex Kras, DO, [facility] (established care 08/21/2020)  REASON FOR CONSULT: Chest pain  REQUESTING PHYSICIAN:  Deland Pretty, MD [address] [place],  [place] [address]  Chief Complaint  Patient presents with  . Chest Pain  . New Patient (Initial Visit)    Referred by Dr. Deland Pretty    HPI  Kendra Taylor is a 75 y.o. female who presents to the office with a chief complaint of " chest pain." Patient's past medical history and cardiovascular risk factors include: GERD, Osteoporosis, rheumatoid arthritis, postmenopausal female, hyperlipidemia, advanced age.  She is referred to the office at the request of Deland Pretty, MD for evaluation of chest pain.  Patient states that for the last several years she has been experiencing chest discomfort which appears to be more noticeable in the recent past.  Patient states that she has a history of heartburn and is difficult to differentiate between progression of GERD or development of heart disease and she voiced her concern to her PCP at the recent office visit.  And now referred to cardiology for further evaluation.  Chest pain usually occurs at night, wakes her up, located substernally, described as a constriction/tightness-like sensation, intermittently, last for about 15 to 20 minutes, thinks it is secondary to GERD so she drinks water mixed with baking soda and that usually alleviates her symptoms.  Symptoms are not brought on by effort related activities and does not resolve with rest.  No family history of premature coronary disease or sudden cardiac death.  FUNCTIONAL STATUS: Enjoys walking couple time a week; however, no structured exercise program or daily routine.   ALLERGIES: Allergies  Allergen Reactions  . Aspirin Other (See Comments)    Ringing in ears with large doses, large doses  causes her to lose hearing    MEDICATION LIST PRIOR TO VISIT: Current Meds  Medication Sig  . alendronate (FOSAMAX) 70 MG tablet TK 1 T PO ONCE A WEEK FOR 28 DAYS  . cefUROXime (CEFTIN) 500 MG tablet Take 1 tablet by mouth every 12 (twelve) hours as needed.  . Cholecalciferol (VITAMIN D) 2000 units tablet Take 2,000 Units by mouth daily.   . cromolyn (OPTICROM) 4 % ophthalmic solution Place 1 drop into both eyes 4 (four) times daily.  . folic acid (FOLVITE) 1 MG tablet Take 1 mg by mouth daily.  Marland Kitchen inFLIXimab (REMICADE) 100 MG injection Inject into the vein every 8 (eight) weeks.   Marland Kitchen levocetirizine (XYZAL) 5 MG tablet Take 5 mg by mouth daily as needed for allergies.   . magnesium gluconate (MAGONATE) 500 MG tablet Take 500 mg by mouth every other day.   . methotrexate (RHEUMATREX) 2.5 MG tablet TK 6 TS PO WEEKLY  . olopatadine (PATANOL) 0.1 % ophthalmic solution 1 drop into affected eye  . ondansetron (ZOFRAN-ODT) 4 MG disintegrating tablet Take 4 mg by mouth every 8 (eight) hours as needed for nausea.  . pantoprazole (PROTONIX) 20 MG tablet Take 20 mg by mouth daily.  Vladimir Faster Glycol-Propyl Glycol (SYSTANE OP) Place 2 drops into both eyes as needed (for dry eyes).  . SUMAtriptan (IMITREX) 100 MG tablet Take 100 mg by mouth every 2 (two) hours as needed for migraine.  . triamcinolone cream (KENALOG) 0.1 % Apply 1 application topically as needed (for skin irritation).  . vitamin B-12 (CYANOCOBALAMIN) 1000 MCG tablet Take  1,000 mcg by mouth daily.  . [DISCONTINUED] metoprolol tartrate (LOPRESSOR) 25 MG tablet Take 1 tablet (25 mg total) by mouth 2 (two) times daily.     PAST MEDICAL HISTORY: Past Medical History:  Diagnosis Date  . Anemia   . Anxiety   . Arthritis   . Atypical mole 01/08/2013   right inner forearm  . Basal cell carcinoma 03/12/2017   right concha exc mohs  . Depression   . Dyspnea   . GERD (gastroesophageal reflux disease)   . HA (headache)   . Migraine   .  Sleep apnea    cpap    PAST SURGICAL HISTORY: Past Surgical History:  Procedure Laterality Date  . ABDOMINAL HYSTERECTOMY    . BUNIONECTOMY     both  . FINGER ARTHROPLASTY Right 07/30/2012   Procedure: RIGHT INDEX FINGER, MIDDLE FINGER, RING FINGER, SMALL FINGER MCP ARTHROPLASTY WITH REALIGNMENT/REPAIR OF EXTENSOR TENDON;  Surgeon: Dominica Severin, MD;  Location: [facility];  Service: Orthopedics;  Laterality: Right;  . FINGER SURGERY    . HAMMER TOE SURGERY Left 03/03/2014   Procedure: SECOND THROUGH FIFTH HAMMERTOE CORRECTION;  Surgeon: Toni Arthurs, MD;  Location: [facility];  Service: Orthopedics;  Laterality: Left;  . JOINT REPLACEMENT    . METATARSAL HEAD EXCISION Left 03/03/2014   Procedure: LEFT SECOND THROUGH FIFTH METATARSAL HEAD EXCISION;  Surgeon: Toni Arthurs, MD;  Location: [facility];  Service: Orthopedics;  Laterality: Left;  . OOPHORECTOMY    . VIDEO BRONCHOSCOPY Bilateral 12/18/2016   Procedure: VIDEO BRONCHOSCOPY WITH FLUORO;  Surgeon: Nyoka Cowden, MD;  Location: [facility];  Service: Cardiopulmonary;  Laterality: Bilateral;  . VIDEO BRONCHOSCOPY WITH ENDOBRONCHIAL NAVIGATION Left 02/26/2017   Procedure: VIDEO BRONCHOSCOPY WITH ENDOBRONCHIAL NAVIGATION;  Surgeon: Leslye Peer, MD;  Location: [facility];  Service: Thoracic;  Laterality: Left;    FAMILY HISTORY: The patient family history includes Dementia in her father and mother; Hyperthyroidism in her mother; Sleep apnea in her father and mother.  SOCIAL HISTORY:  The patient  reports that she has never smoked. She has never used smokeless tobacco. She reports that she does not drink alcohol and does not use drugs.  REVIEW OF SYSTEMS: Review of Systems  Constitutional: Negative for chills and fever.  HENT: Negative for hoarse voice and nosebleeds.   Eyes: Negative for discharge, double vision and pain.  Cardiovascular: Positive for chest pain. Negative for claudication, dyspnea on exertion,  leg swelling, near-syncope, orthopnea, palpitations, paroxysmal nocturnal dyspnea and syncope.  Respiratory: Negative for hemoptysis and shortness of breath.   Musculoskeletal: Negative for muscle cramps and myalgias.  Gastrointestinal: Negative for abdominal pain, constipation, diarrhea, hematemesis, hematochezia, melena, nausea and vomiting.  Neurological: Negative for dizziness and light-headedness.    PHYSICAL EXAM: Vitals with BMI 08/22/2020 09/29/2017 04/21/2017  Height 5' 2.4" 5' 2.402" 5' 2.992"  Weight 120 lbs 3 oz 124 lbs 13 oz 129 lbs  BMI 21.7 22.53 22.86  Systolic 117 102 045  Diastolic 79 60 82  Pulse 84 85 72    CONSTITUTIONAL: Well-developed and well-nourished. No acute distress.  SKIN: Skin is warm and dry. No rash noted. No cyanosis. No pallor. No jaundice HEAD: Normocephalic and atraumatic.  EYES: No scleral icterus MOUTH/THROAT: Moist oral membranes.  NECK: No JVD present. No thyromegaly noted. No carotid bruits  LYMPHATIC: No visible cervical adenopathy.  CHEST Normal respiratory effort. No intercostal retractions  LUNGS: Clear to auscultation bilaterally.  No stridor. No wheezes. No rales.  CARDIOVASCULAR: Regular rate and rhythm, positive S1-S2, no murmurs rubs or gallops appreciated. ABDOMINAL: Nonobese, soft, nontender, nondistended, positive bowel sounds in all 4 quadrants.  No apparent ascites.  EXTREMITIES: Trace peripheral edema.  2+ bilateral posterior tibial and dorsalis pedis pulses,LUE ulnar deviation of hand. HEMATOLOGIC: No significant bruising NEUROLOGIC: Oriented to person, place, and time. Nonfocal. Normal muscle tone.  PSYCHIATRIC: Normal mood and affect. Normal behavior. Cooperative  CARDIAC DATABASE: EKG: 08/22/2020: Normal sinus rhythm, 85 bpm, consider old anteroseptal infarct, without underlying injury pattern.   Echocardiogram: No results found for this or any previous visit from the past 1095 days.   Stress Testing: No results found  for this or any previous visit from the past 1095 days.  Heart Catheterization: None  LABORATORY DATA: CBC Latest Ref Rng & Units 02/21/2017 03/03/2014 07/22/2012  WBC 4.0 - 10.5 K/uL 5.9 - 6.4  Hemoglobin 12.0 - 15.0 g/dL 14.0 14.2 14.0  Hematocrit 36.0 - 46.0 % 42.4 - 42.4  Platelets 150 - 400 K/uL 186 - 218    CMP Latest Ref Rng & Units 07/22/2012  Glucose 70 - 99 mg/dL 72  BUN 6 - 23 mg/dL 23  Creatinine 0.50 - 1.10 mg/dL 0.81  Sodium 135 - 145 mEq/L 142  Potassium 3.5 - 5.1 mEq/L 4.5  Chloride 96 - 112 mEq/L 103  CO2 19 - 32 mEq/L 32  Calcium 8.4 - 10.5 mg/dL 9.8    Lipid Panel  No results found for: CHOL, TRIG, HDL, CHOLHDL, VLDL, LDLCALC, LDLDIRECT, LABVLDL  No components found for: NTPROBNP No results for input(s): PROBNP in the last 8760 hours. No results for input(s): TSH in the last 8760 hours.  BMP No results for input(s): NA, K, CL, CO2, GLUCOSE, BUN, CREATININE, CALCIUM, GFRNONAA, GFRAA in the last 8760 hours.  HEMOGLOBIN A1C No results found for: HGBA1C, MPG   External Labs:  Date Collected: 07/25/2020 , information obtained by PCP Lipid profile: Total cholesterol 223 , triglycerides 102 , HDL 72 , LDL 131, non-HDL 151 TSH: 1.5   05/10/2020 Creatinine 0.73 mg/dL. eGFR: 93 mL/min per 1.73 m Hemoglobin: 13.0 g/dL and hematocrit: 40.0 % AST: 32 , ALT: 12 , alkaline phosphatase: 60   IMPRESSION:    ICD-10-CM   1. Precordial pain  R07.2 EKG 12-Lead    CT CORONARY MORPH W/CTA COR W/SCORE W/CA W/CM &/OR WO/CM    CT CORONARY FRACTIONAL FLOW RESERVE DATA PREP    CT CORONARY FRACTIONAL FLOW RESERVE FLUID ANALYSIS    Basic metabolic panel    PCV ECHOCARDIOGRAM COMPLETE    DISCONTINUED: metoprolol tartrate (LOPRESSOR) 25 MG tablet  2. Rheumatoid arthritis involving multiple sites, unspecified whether rheumatoid factor present ([place])  M06.9   3. Hypercholesterolemia  E78.00      RECOMMENDATIONS: Laya Letendre is a 75 y.o. female whose past  medical history and cardiac risk factors include: GERD, Osteoporosis, rheumatoid arthritis, postmenopausal female, hyperlipidemia, advanced age.  Patient symptoms of precordial pain have could be possibly cardiac in etiology and given her multiple cardiovascular risk factors including hyperlipidemia, prolonged history of rheumatoid arthritis, advanced age, postmenopausal female I would recommend ischemic evaluation.  We discussed exercise nuclear stress test versus coronary CTA and the shared decision was to proceed with coronary CTA with or without CT FFR.  Check BMP.  Start Lopressor 25 mg p.o. twice daily for the upcoming coronary CTA.  Echocardiogram will be ordered to evaluate for structural heart disease and left ventricular systolic function.  Patient is asked to seek  medical attention sooner if she has worsening intensity, frequency, and/or duration of her current symptoms or develops a typical chest pain as discussed at today's office visit by going to the closest ER via EMS.  Further recommendations to follow based on the results of the echo and coronary CTA.  FINAL MEDICATION LIST END OF ENCOUNTER: Meds ordered this encounter  Medications  . DISCONTD: metoprolol tartrate (LOPRESSOR) 25 MG tablet    Sig: Take 1 tablet (25 mg total) by mouth 2 (two) times daily.    Dispense:  60 tablet    Refill:  0    Medications Discontinued During This Encounter  Medication Reason  . CALCIUM CARBONATE PO Error  . Potassium 99 MG TABS Error  . ranitidine (ZANTAC) 150 MG tablet Error  . zolpidem (AMBIEN) 10 MG tablet Error     Current Outpatient Medications:  .  alendronate (FOSAMAX) 70 MG tablet, TK 1 T PO ONCE A WEEK FOR 28 DAYS, Disp: , Rfl: 3 .  cefUROXime (CEFTIN) 500 MG tablet, Take 1 tablet by mouth every 12 (twelve) hours as needed., Disp: , Rfl:  .  Cholecalciferol (VITAMIN D) 2000 units tablet, Take 2,000 Units by mouth daily. , Disp: , Rfl:  .  cromolyn (OPTICROM) 4 % ophthalmic  solution, Place 1 drop into both eyes 4 (four) times daily., Disp: , Rfl:  .  folic acid (FOLVITE) 1 MG tablet, Take 1 mg by mouth daily., Disp: , Rfl:  .  inFLIXimab (REMICADE) 100 MG injection, Inject into the vein every 8 (eight) weeks. , Disp: , Rfl:  .  levocetirizine (XYZAL) 5 MG tablet, Take 5 mg by mouth daily as needed for allergies. , Disp: , Rfl:  .  magnesium gluconate (MAGONATE) 500 MG tablet, Take 500 mg by mouth every other day. , Disp: , Rfl:  .  methotrexate (RHEUMATREX) 2.5 MG tablet, TK 6 TS PO WEEKLY, Disp: , Rfl:  .  olopatadine (PATANOL) 0.1 % ophthalmic solution, 1 drop into affected eye, Disp: , Rfl:  .  ondansetron (ZOFRAN-ODT) 4 MG disintegrating tablet, Take 4 mg by mouth every 8 (eight) hours as needed for nausea., Disp: , Rfl:  .  pantoprazole (PROTONIX) 20 MG tablet, Take 20 mg by mouth daily., Disp: , Rfl:  .  Polyethyl Glycol-Propyl Glycol (SYSTANE OP), Place 2 drops into both eyes as needed (for dry eyes)., Disp: , Rfl:  .  SUMAtriptan (IMITREX) 100 MG tablet, Take 100 mg by mouth every 2 (two) hours as needed for migraine., Disp: , Rfl:  .  triamcinolone cream (KENALOG) 0.1 %, Apply 1 application topically as needed (for skin irritation)., Disp: , Rfl:  .  vitamin B-12 (CYANOCOBALAMIN) 1000 MCG tablet, Take 1,000 mcg by mouth daily., Disp: , Rfl:  .  metoprolol tartrate (LOPRESSOR) 25 MG tablet, TAKE 1 TABLET(25 MG) BY MOUTH TWICE DAILY, Disp: 180 tablet, Rfl: 0  Orders Placed This Encounter  Procedures  . CT CORONARY MORPH W/CTA COR W/SCORE W/CA W/CM &/OR WO/CM  . CT CORONARY FRACTIONAL FLOW RESERVE DATA PREP  . CT CORONARY FRACTIONAL FLOW RESERVE FLUID ANALYSIS  . Basic metabolic panel  . EKG 12-Lead  . PCV ECHOCARDIOGRAM COMPLETE    There are no Patient Instructions on file for this visit.   --Continue cardiac medications as reconciled in final medication list. --Return in about 29 days (around 09/20/2020) for Follow up chest pain. , Review test  results. Or sooner if needed. --Continue follow-up with your primary care physician regarding the  management of your other chronic comorbid conditions.  Patient's questions and concerns were addressed to her satisfaction. She voices understanding of the instructions provided during this encounter.   This note was created using a voice recognition software as a result there may be grammatical errors inadvertently enclosed that do not reflect the nature of this encounter. Every attempt is made to correct such errors.  Rex Kras, [place], [facility]  Pager: [phone] Office: [phone]

## 2020-08-22 ENCOUNTER — Ambulatory Visit: Payer: MEDICARE | Admitting: Cardiology

## 2020-08-22 ENCOUNTER — Other Ambulatory Visit: Payer: Self-pay | Admitting: Cardiology

## 2020-08-22 ENCOUNTER — Encounter: Payer: Self-pay | Admitting: Cardiology

## 2020-08-22 ENCOUNTER — Other Ambulatory Visit: Payer: Self-pay

## 2020-08-22 VITALS — BP 117/79 | HR 84 | Temp 98.7°F | Resp 16 | Ht 62.4 in | Wt 120.2 lb

## 2020-08-22 DIAGNOSIS — R072 Precordial pain: Secondary | ICD-10-CM

## 2020-08-22 DIAGNOSIS — E7800 Pure hypercholesterolemia, unspecified: Secondary | ICD-10-CM

## 2020-08-22 DIAGNOSIS — M069 Rheumatoid arthritis, unspecified: Secondary | ICD-10-CM | POA: Diagnosis not present

## 2020-08-22 MED ORDER — METOPROLOL TARTRATE 25 MG PO TABS: 25.0000 mg | ORAL_TABLET | Freq: Two times a day (BID) | ORAL | 0 refills | Status: DC

## 2020-08-23 DIAGNOSIS — M069 Rheumatoid arthritis, unspecified: Secondary | ICD-10-CM | POA: Diagnosis not present

## 2020-08-23 DIAGNOSIS — Z79899 Other long term (current) drug therapy: Secondary | ICD-10-CM | POA: Diagnosis not present

## 2020-08-23 DIAGNOSIS — M1990 Unspecified osteoarthritis, unspecified site: Secondary | ICD-10-CM | POA: Diagnosis not present

## 2020-08-23 DIAGNOSIS — K219 Gastro-esophageal reflux disease without esophagitis: Secondary | ICD-10-CM | POA: Diagnosis not present

## 2020-08-23 DIAGNOSIS — M79676 Pain in unspecified toe(s): Secondary | ICD-10-CM | POA: Diagnosis not present

## 2020-08-23 DIAGNOSIS — M8580 Other specified disorders of bone density and structure, unspecified site: Secondary | ICD-10-CM | POA: Diagnosis not present

## 2020-08-23 DIAGNOSIS — M7070 Other bursitis of hip, unspecified hip: Secondary | ICD-10-CM | POA: Diagnosis not present

## 2020-08-28 ENCOUNTER — Telehealth: Payer: Self-pay

## 2020-08-28 DIAGNOSIS — J309 Allergic rhinitis, unspecified: Secondary | ICD-10-CM | POA: Diagnosis not present

## 2020-08-28 DIAGNOSIS — R519 Headache, unspecified: Secondary | ICD-10-CM | POA: Diagnosis not present

## 2020-08-28 DIAGNOSIS — H1045 Other chronic allergic conjunctivitis: Secondary | ICD-10-CM | POA: Diagnosis not present

## 2020-08-28 DIAGNOSIS — J300 Vasomotor rhinitis: Secondary | ICD-10-CM | POA: Diagnosis not present

## 2020-08-28 NOTE — Telephone Encounter (Signed)
Patient called regarding metoprolol, how many days before the CTA does she take it, her test is on 09/05/20

## 2020-08-28 NOTE — Telephone Encounter (Signed)
Start today

## 2020-08-29 DIAGNOSIS — R072 Precordial pain: Secondary | ICD-10-CM | POA: Diagnosis not present

## 2020-08-30 LAB — BASIC METABOLIC PANEL
BUN/Creatinine Ratio: 30 — ABNORMAL HIGH (ref 12–28)
BUN: 24 mg/dL (ref 8–27)
CO2: 26 mmol/L (ref 20–29)
Calcium: 9.2 mg/dL (ref 8.7–10.3)
Chloride: 100 mmol/L (ref 96–106)
Creatinine, Ser: 0.79 mg/dL (ref 0.57–1.00)
Glucose: 144 mg/dL — ABNORMAL HIGH (ref 65–99)
Potassium: 4 mmol/L (ref 3.5–5.2)
Sodium: 141 mmol/L (ref 134–144)
eGFR: 77 mL/min/{1.73_m2} (ref >59)

## 2020-08-31 DIAGNOSIS — Z20822 Contact with and (suspected) exposure to covid-19: Secondary | ICD-10-CM | POA: Diagnosis not present

## 2020-08-31 DIAGNOSIS — Z23 Encounter for immunization: Secondary | ICD-10-CM | POA: Diagnosis not present

## 2020-09-01 ENCOUNTER — Telehealth (HOSPITAL_COMMUNITY): Payer: Self-pay | Admitting: Emergency Medicine

## 2020-09-01 NOTE — Telephone Encounter (Signed)
Left voicemail for patient regarding the moving of her CCTA appt due to contrast shortage.   I gave [facility] office phone number to call to r/s.   Marchia Bond RN Navigator Cardiac Imaging [facility] [phone] Office  [phone] Cell

## 2020-09-04 ENCOUNTER — Other Ambulatory Visit: Payer: MEDICARE

## 2020-09-04 NOTE — Telephone Encounter (Signed)
When is she rescheduled?

## 2020-09-05 ENCOUNTER — Ambulatory Visit (HOSPITAL_COMMUNITY): Payer: MEDICARE

## 2020-09-06 DIAGNOSIS — K921 Melena: Secondary | ICD-10-CM | POA: Diagnosis not present

## 2020-09-06 DIAGNOSIS — K625 Hemorrhage of anus and rectum: Secondary | ICD-10-CM | POA: Diagnosis not present

## 2020-09-06 DIAGNOSIS — K5900 Constipation, unspecified: Secondary | ICD-10-CM | POA: Diagnosis not present

## 2020-09-07 NOTE — Telephone Encounter (Signed)
Please call the patient and advised her to start taking metoprolol the week prior to her coronary CTA.

## 2020-09-08 NOTE — Telephone Encounter (Signed)
No answer left a vm to call back

## 2020-09-08 NOTE — Telephone Encounter (Signed)
Called and spoke to pt regarding metoprolol. Pt voiced understanding.

## 2020-09-11 ENCOUNTER — Ambulatory Visit: Payer: MEDICARE

## 2020-09-11 ENCOUNTER — Other Ambulatory Visit: Payer: Self-pay

## 2020-09-11 DIAGNOSIS — R072 Precordial pain: Secondary | ICD-10-CM | POA: Diagnosis not present

## 2020-09-15 ENCOUNTER — Telehealth (HOSPITAL_COMMUNITY): Payer: Self-pay | Admitting: Emergency Medicine

## 2020-09-15 NOTE — Telephone Encounter (Signed)
Reaching out to patient to offer assistance regarding upcoming cardiac imaging study; pt verbalizes understanding of appt date/time, parking situation and where to check in, pre-test NPO status and medications ordered, and verified current allergies; name and call back number provided for further questions should they arise Kendra Bond RN [place] [facility] [phone] office [phone] cell   Pt taking 25mg  metoprolol tart BID until scan. Avoiding her antihistamine Clarise Cruz

## 2020-09-19 ENCOUNTER — Ambulatory Visit (HOSPITAL_COMMUNITY)
Admission: RE | Admit: 2020-09-19 | Discharge: 2020-09-19 | Disposition: A | Discharge status: Home / Self Care | Payer: MEDICARE | Source: Ambulatory Visit | Attending: Cardiology | Admitting: Cardiology

## 2020-09-19 ENCOUNTER — Other Ambulatory Visit: Payer: Self-pay

## 2020-09-19 ENCOUNTER — Encounter (HOSPITAL_COMMUNITY): Payer: Self-pay

## 2020-09-19 DIAGNOSIS — R072 Precordial pain: Secondary | ICD-10-CM | POA: Diagnosis not present

## 2020-09-19 MED ORDER — NITROGLYCERIN 0.4 MG SL SUBL
SUBLINGUAL_TABLET | SUBLINGUAL | Status: AC
  Filled 2020-09-19: qty 2

## 2020-09-19 MED ORDER — SODIUM CHLORIDE 0.9 % IV BOLUS: 1000.0000 mL | Freq: Once | INTRAVENOUS | Status: DC

## 2020-09-19 MED ORDER — IOHEXOL 350 MG/ML SOLN
95.0000 mL | Freq: Once | INTRAVENOUS | Status: AC | PRN
  Administered 2020-09-19: 95 mL via INTRAVENOUS

## 2020-09-19 MED ORDER — NITROGLYCERIN 0.4 MG SL SUBL
0.8000 mg | SUBLINGUAL_TABLET | Freq: Once | SUBLINGUAL | Status: AC
  Administered 2020-09-19: 0.8 mg via SUBLINGUAL

## 2020-09-20 DIAGNOSIS — R072 Precordial pain: Secondary | ICD-10-CM | POA: Insufficient documentation

## 2020-09-20 NOTE — Progress Notes (Signed)
Called and spoke with patient regarding her echocardiogram results.

## 2020-09-21 ENCOUNTER — Ambulatory Visit: Payer: MEDICARE | Admitting: Cardiology

## 2020-09-21 DIAGNOSIS — D123 Benign neoplasm of transverse colon: Secondary | ICD-10-CM | POA: Diagnosis not present

## 2020-09-21 DIAGNOSIS — D120 Benign neoplasm of cecum: Secondary | ICD-10-CM | POA: Diagnosis not present

## 2020-09-21 DIAGNOSIS — R195 Other fecal abnormalities: Secondary | ICD-10-CM | POA: Diagnosis not present

## 2020-09-21 DIAGNOSIS — K635 Polyp of colon: Secondary | ICD-10-CM | POA: Diagnosis not present

## 2020-09-21 DIAGNOSIS — K5730 Diverticulosis of large intestine without perforation or abscess without bleeding: Secondary | ICD-10-CM | POA: Diagnosis not present

## 2020-09-21 DIAGNOSIS — K921 Melena: Secondary | ICD-10-CM | POA: Diagnosis not present

## 2020-09-26 NOTE — Progress Notes (Signed)
Spoke to patient she voiced understanding. Patient said she follows up with PCP patient asked to fax this to her pcp

## 2020-10-04 ENCOUNTER — Ambulatory Visit: Payer: MEDICARE | Admitting: Cardiology

## 2020-10-10 DIAGNOSIS — M0589 Other rheumatoid arthritis with rheumatoid factor of multiple sites: Secondary | ICD-10-CM | POA: Diagnosis not present

## 2020-10-16 ENCOUNTER — Encounter: Payer: Self-pay | Admitting: Cardiology

## 2020-10-16 ENCOUNTER — Ambulatory Visit: Payer: MEDICARE | Admitting: Cardiology

## 2020-10-16 ENCOUNTER — Other Ambulatory Visit: Payer: Self-pay

## 2020-10-16 VITALS — BP 135/71 | HR 79 | Temp 98.4°F | Resp 16 | Ht 62.0 in | Wt 117.0 lb

## 2020-10-16 DIAGNOSIS — R072 Precordial pain: Secondary | ICD-10-CM

## 2020-10-16 DIAGNOSIS — E7800 Pure hypercholesterolemia, unspecified: Secondary | ICD-10-CM | POA: Diagnosis not present

## 2020-10-16 DIAGNOSIS — M069 Rheumatoid arthritis, unspecified: Secondary | ICD-10-CM | POA: Diagnosis not present

## 2020-10-16 DIAGNOSIS — Z712 Person consulting for explanation of examination or test findings: Secondary | ICD-10-CM

## 2020-10-16 NOTE — Progress Notes (Signed)
ID:  [name], [name] 1945/07/23, MRN [id number]  PCP:  Deland Pretty, MD  Cardiologist:  Rex Kras, DO, [facility] (established care 08/21/2020)  Date: 10/16/20 Last Office Visit: 08/22/2020   Chief Complaint  Patient presents with   Precordial pain   Results   Follow-up    HPI  Kendra Taylor is a 75 y.o. female who presents to the office with a chief complaint of " reevaluation of chest pain and review test results." Patient's past medical history and cardiovascular risk factors include: GERD, Osteoporosis, rheumatoid arthritis, postmenopausal female, hyperlipidemia, advanced age.  She is referred to the office at the request of Deland Pretty, MD for evaluation of chest pain.  Patient was referred to the office for evaluation of chest pain.  At the last office visit her precordial pain appear to be possibly cardiac in etiology and given her multiple cardiovascular risk factors as noted above this shared decision was to proceed with coronary CTA.  Since last office visit patient has not had any reoccurrence of chest pain or shortness of breath at rest or with effort related activities.  She has remained relatively stable.  No change in functional status.  Reviewed the results of the echocardiogram and coronary CTA with the patient in great details including the images and findings summarized below for further reference.  FUNCTIONAL STATUS: Enjoys walking couple time a week; however, no structured exercise program or daily routine.   ALLERGIES: Allergies  Allergen Reactions   Aspirin Other (See Comments)    Ringing in ears with large doses, large doses causes her to lose hearing    MEDICATION LIST PRIOR TO VISIT: Current Meds  Medication Sig   alendronate (FOSAMAX) 70 MG tablet TK 1 T PO ONCE A WEEK FOR 28 DAYS   Cholecalciferol (VITAMIN D) 2000 units tablet Take 2,000 Units by mouth daily.    cromolyn (OPTICROM) 4 % ophthalmic solution Place 1 drop into  both eyes 4 (four) times daily.   fluticasone (FLONASE) 50 MCG/ACT nasal spray SMARTSIG:1-2 Spray(s) Both Nares Daily   folic acid (FOLVITE) 1 MG tablet Take 1 mg by mouth daily.   inFLIXimab (REMICADE) 100 MG injection Inject into the vein every 8 (eight) weeks.    levocetirizine (XYZAL) 5 MG tablet Take 5 mg by mouth daily as needed for allergies.    magnesium gluconate (MAGONATE) 500 MG tablet Take 500 mg by mouth every other day.    methotrexate (RHEUMATREX) 2.5 MG tablet TK 6 TS PO WEEKLY   olopatadine (PATANOL) 0.1 % ophthalmic solution 1 drop into affected eye   pantoprazole (PROTONIX) 20 MG tablet Take 20 mg by mouth daily.   Polyethyl Glycol-Propyl Glycol (SYSTANE OP) Place 2 drops into both eyes as needed (for dry eyes).   SUMAtriptan (IMITREX) 100 MG tablet Take 100 mg by mouth every 2 (two) hours as needed for migraine.   triamcinolone cream (KENALOG) 0.1 % Apply 1 application topically as needed (for skin irritation).   vitamin B-12 (CYANOCOBALAMIN) 1000 MCG tablet Take 1,000 mcg by mouth daily.     PAST MEDICAL HISTORY: Past Medical History:  Diagnosis Date   Anemia    Anxiety    Arthritis    Atypical mole 01/08/2013   right inner forearm   Basal cell carcinoma 03/12/2017   right concha exc mohs   Depression    Dyspnea    GERD (gastroesophageal reflux disease)    HA (headache)    Migraine    Sleep apnea  cpap    PAST SURGICAL HISTORY: Past Surgical History:  Procedure Laterality Date   ABDOMINAL HYSTERECTOMY     BUNIONECTOMY     both   FINGER ARTHROPLASTY Right 07/30/2012   Procedure: RIGHT INDEX FINGER, MIDDLE FINGER, RING FINGER, SMALL FINGER MCP ARTHROPLASTY WITH REALIGNMENT/REPAIR OF EXTENSOR TENDON;  Surgeon: Roseanne Kaufman, MD;  Location: [place];  Service: Orthopedics;  Laterality: Right;   FINGER SURGERY     HAMMER TOE SURGERY Left 03/03/2014   Procedure: SECOND THROUGH FIFTH HAMMERTOE CORRECTION;  Surgeon: Wylene Simmer, MD;  Location: [place];  Service: Orthopedics;  Laterality: Left;   JOINT REPLACEMENT     METATARSAL HEAD EXCISION Left 03/03/2014   Procedure: LEFT SECOND THROUGH FIFTH METATARSAL HEAD EXCISION;  Surgeon: Wylene Simmer, MD;  Location: [place];  Service: Orthopedics;  Laterality: Left;   OOPHORECTOMY     VIDEO BRONCHOSCOPY Bilateral 12/18/2016   Procedure: VIDEO BRONCHOSCOPY WITH FLUORO;  Surgeon: Tanda Rockers, MD;  [facility]: [facility];  Service: Cardiopulmonary;  Laterality: Bilateral;   VIDEO BRONCHOSCOPY WITH ENDOBRONCHIAL NAVIGATION Left 02/26/2017   Procedure: VIDEO BRONCHOSCOPY WITH ENDOBRONCHIAL NAVIGATION;  Surgeon: Collene Gobble, MD;  [facility];  Service: Thoracic;  Laterality: Left;    FAMILY HISTORY: The patient family history includes Dementia in her father and mother; Hyperthyroidism in her mother; Sleep apnea in her father and mother.  SOCIAL HISTORY:  The patient  reports that she has never smoked. She has never used smokeless tobacco. She reports that she does not drink alcohol and does not use drugs.  REVIEW OF SYSTEMS: Review of Systems  Constitutional: Negative for chills and fever.  HENT:  Negative for hoarse voice and nosebleeds.   Eyes:  Negative for discharge, double vision and pain.  Cardiovascular:  Negative for chest pain, claudication, dyspnea on exertion, leg swelling, near-syncope, orthopnea, palpitations, paroxysmal nocturnal dyspnea and syncope.  Respiratory:  Negative for hemoptysis and shortness of breath.   Musculoskeletal:  Negative for muscle cramps and myalgias.  Gastrointestinal:  Negative for abdominal pain, constipation, diarrhea, hematemesis, hematochezia, melena, nausea and vomiting.  Neurological:  Negative for dizziness and light-headedness.   PHYSICAL EXAM: Vitals with BMI 10/16/2020 09/19/2020 09/19/2020  Height '5\' 2"'  - -  Weight 117 lbs - -  BMI 91.50 - -  Systolic 569 794 97  Diastolic 71 69 50  Pulse 79 - -     CONSTITUTIONAL: Well-developed and well-nourished. No acute distress.  SKIN: Skin is warm and dry. No rash noted. No cyanosis. No pallor. No jaundice HEAD: Normocephalic and atraumatic.  EYES: No scleral icterus MOUTH/THROAT: Moist oral membranes.  NECK: No JVD present. No thyromegaly noted. No carotid bruits  LYMPHATIC: No visible cervical adenopathy.  CHEST Normal respiratory effort. No intercostal retractions  LUNGS: Clear to auscultation bilaterally.  No stridor. No wheezes. No rales.  CARDIOVASCULAR: Regular rate and rhythm, positive S1-S2, no murmurs rubs or gallops appreciated. ABDOMINAL: Nonobese, soft, nontender, nondistended, positive bowel sounds in all 4 quadrants.  No apparent ascites.  EXTREMITIES: Trace peripheral edema.  2+ bilateral posterior tibial and dorsalis pedis pulses,LUE ulnar deviation of hand. HEMATOLOGIC: No significant bruising NEUROLOGIC: Oriented to person, place, and time. Nonfocal. Normal muscle tone.  PSYCHIATRIC: Normal mood and affect. Normal behavior. Cooperative  CARDIAC DATABASE: EKG: 08/22/2020: Normal sinus rhythm, 85 bpm, consider old anteroseptal infarct, without underlying injury pattern.   Echocardiogram: 09/11/2020: Left ventricle cavity is normal in size and wall thickness. Normal global wall motion. Normal  LV systolic function with EF 62%. Doppler evidence of grade I (impaired) diastolic dysfunction, normal LAP. Trileaflet aortic valve with mild aortic valve leaflet calcification. Trace aortic regurgitation. Mild tricuspid regurgitation. No evidence of pulmonary hypertension.  Stress Testing: No results found for this or any previous visit from the past 1095 days.  Heart Catheterization: None  Coronary CTA 09/19/2020: 1. Total coronary calcium score of 0. 2. Normal coronary origin with right dominance. 3. Aortic atherosclerosis. 4. CAD-RADS = 2. Mild stenosis (25-49%) due to non-calcified plaque in the proximal RCA. Noncardiac  findings: 1. Left lower lobe lung mass, slightly enlarged compared to 02/12/2017. This was biopsied on 02/26/2017 with no evidence of malignancy.  LABORATORY DATA: CBC Latest Ref Rng & Units 02/21/2017 03/03/2014 07/22/2012  WBC 4.0 - 10.5 K/uL 5.9 - 6.4  Hemoglobin 12.0 - 15.0 g/dL 14.0 14.2 14.0  Hematocrit 36.0 - 46.0 % 42.4 - 42.4  Platelets 150 - 400 K/uL 186 - 218    CMP Latest Ref Rng & Units 08/29/2020 07/22/2012  Glucose 65 - 99 mg/dL 144(H) 72  BUN 8 - 27 mg/dL 24 23  Creatinine 0.57 - 1.00 mg/dL 0.79 0.81  Sodium 134 - 144 mmol/L 141 142  Potassium 3.5 - 5.2 mmol/L 4.0 4.5  Chloride 96 - 106 mmol/L 100 103  CO2 20 - 29 mmol/L 26 32  Calcium 8.7 - 10.3 mg/dL 9.2 9.8    Lipid Panel  No results found for: CHOL, TRIG, HDL, CHOLHDL, VLDL, LDLCALC, LDLDIRECT, LABVLDL  No components found for: NTPROBNP No results for input(s): PROBNP in the last 8760 hours. No results for input(s): TSH in the last 8760 hours.  BMP Recent Labs    08/29/20 1511  NA 141  K 4.0  CL 100  CO2 26  GLUCOSE 144*  BUN 24  CREATININE 0.79  CALCIUM 9.2    HEMOGLOBIN A1C No results found for: HGBA1C, MPG   External Labs:  Date Collected: 07/25/2020 , information obtained by PCP Lipid profile: Total cholesterol 223 , triglycerides 102 , HDL 72 , LDL 131, non-HDL 151 TSH: 1.5   05/10/2020 Creatinine 0.73 mg/dL. eGFR: 93 mL/min per 1.73 m Hemoglobin: 13.0 g/dL and hematocrit: 40.0 % AST: 32 , ALT: 12 , alkaline phosphatase: 60   IMPRESSION:    ICD-10-CM   1. Precordial pain  R07.2     2. Rheumatoid arthritis involving multiple sites, unspecified whether rheumatoid factor present ([place])  M06.9     3. Hypercholesterolemia  E78.00     4. Encounter to discuss test results  Z71.2         RECOMMENDATIONS: Meshell Abdulaziz is a 75 y.o. female whose past medical history and cardiac risk factors include: GERD, Osteoporosis, rheumatoid arthritis, postmenopausal female,  hyperlipidemia, advanced age.  Since last office visit patient's precordial pain has resolved.  Total coronary calcium score 0.  She is still noted to have mild noncalcified plaque in the RCA distribution.  Educated on importance of improving her modifiable cardiovascular risk factors.  Based on the most recent lipid profile in March 2022 her LDL was 131 mg/dL.  Recommend statin therapy if her LDL does not improve with lifestyle modifications.  Patient states that she will follow-up with PCP.  Patient is also made aware that the noncardiac findings on the coronary CTA noted a left lower lobe lung mass which is slightly enlarged compared to prior study dated 02/12/2017.  Patient states that she will follow-up with PCP.   FINAL MEDICATION LIST END  OF ENCOUNTER: No orders of the defined types were placed in this encounter.   Medications Discontinued During This Encounter  Medication Reason   cefUROXime (CEFTIN) 500 MG tablet Error   ondansetron (ZOFRAN-ODT) 4 MG disintegrating tablet Error     Current Outpatient Medications:    alendronate (FOSAMAX) 70 MG tablet, TK 1 T PO ONCE A WEEK FOR 28 DAYS, Disp: , Rfl: 3   Cholecalciferol (VITAMIN D) 2000 units tablet, Take 2,000 Units by mouth daily. , Disp: , Rfl:    cromolyn (OPTICROM) 4 % ophthalmic solution, Place 1 drop into both eyes 4 (four) times daily., Disp: , Rfl:    fluticasone (FLONASE) 50 MCG/ACT nasal spray, SMARTSIG:1-2 Spray(s) Both Nares Daily, Disp: , Rfl:    folic acid (FOLVITE) 1 MG tablet, Take 1 mg by mouth daily., Disp: , Rfl:    inFLIXimab (REMICADE) 100 MG injection, Inject into the vein every 8 (eight) weeks. , Disp: , Rfl:    levocetirizine (XYZAL) 5 MG tablet, Take 5 mg by mouth daily as needed for allergies. , Disp: , Rfl:    magnesium gluconate (MAGONATE) 500 MG tablet, Take 500 mg by mouth every other day. , Disp: , Rfl:    methotrexate (RHEUMATREX) 2.5 MG tablet, TK 6 TS PO WEEKLY, Disp: , Rfl:    olopatadine (PATANOL)  0.1 % ophthalmic solution, 1 drop into affected eye, Disp: , Rfl:    pantoprazole (PROTONIX) 20 MG tablet, Take 20 mg by mouth daily., Disp: , Rfl:    Polyethyl Glycol-Propyl Glycol (SYSTANE OP), Place 2 drops into both eyes as needed (for dry eyes)., Disp: , Rfl:    SUMAtriptan (IMITREX) 100 MG tablet, Take 100 mg by mouth every 2 (two) hours as needed for migraine., Disp: , Rfl:    triamcinolone cream (KENALOG) 0.1 %, Apply 1 application topically as needed (for skin irritation)., Disp: , Rfl:    vitamin B-12 (CYANOCOBALAMIN) 1000 MCG tablet, Take 1,000 mcg by mouth daily., Disp: , Rfl:    metoprolol tartrate (LOPRESSOR) 25 MG tablet, TAKE 1 TABLET(25 MG) BY MOUTH TWICE DAILY, Disp: 180 tablet, Rfl: 0  No orders of the defined types were placed in this encounter.   There are no Patient Instructions on file for this visit.   --Continue cardiac medications as reconciled in final medication list. --Return in about 3 years (around 10/17/2023). Or sooner if needed. --Continue follow-up with your primary care physician regarding the management of your other chronic comorbid conditions.  Patient's questions and concerns were addressed to her satisfaction. She voices understanding of the instructions provided during this encounter.   This note was created using a voice recognition software as a result there may be grammatical errors inadvertently enclosed that do not reflect the nature of this encounter. Every attempt is made to correct such errors.  Rex Kras, [place], [facility]  Pager: [phone] Office: [phone]

## 2020-10-19 ENCOUNTER — Ambulatory Visit: Payer: MEDICARE | Admitting: Cardiology

## 2020-12-11 DIAGNOSIS — M0589 Other rheumatoid arthritis with rheumatoid factor of multiple sites: Secondary | ICD-10-CM | POA: Diagnosis not present

## 2020-12-25 DIAGNOSIS — M8580 Other specified disorders of bone density and structure, unspecified site: Secondary | ICD-10-CM | POA: Diagnosis not present

## 2020-12-25 DIAGNOSIS — Z79899 Other long term (current) drug therapy: Secondary | ICD-10-CM | POA: Diagnosis not present

## 2020-12-25 DIAGNOSIS — M810 Age-related osteoporosis without current pathological fracture: Secondary | ICD-10-CM | POA: Diagnosis not present

## 2020-12-25 DIAGNOSIS — M069 Rheumatoid arthritis, unspecified: Secondary | ICD-10-CM | POA: Diagnosis not present

## 2020-12-25 DIAGNOSIS — K219 Gastro-esophageal reflux disease without esophagitis: Secondary | ICD-10-CM | POA: Diagnosis not present

## 2020-12-25 DIAGNOSIS — M1990 Unspecified osteoarthritis, unspecified site: Secondary | ICD-10-CM | POA: Diagnosis not present

## 2020-12-25 DIAGNOSIS — M7070 Other bursitis of hip, unspecified hip: Secondary | ICD-10-CM | POA: Diagnosis not present

## 2020-12-26 ENCOUNTER — Emergency Department (HOSPITAL_BASED_OUTPATIENT_CLINIC_OR_DEPARTMENT_OTHER): Payer: MEDICARE

## 2020-12-26 ENCOUNTER — Observation Stay (HOSPITAL_BASED_OUTPATIENT_CLINIC_OR_DEPARTMENT_OTHER)
Admission: EM | Admit: 2020-12-26 | Discharge: 2020-12-27 | Disposition: A | Discharge status: Home / Self Care | Payer: MEDICARE | Attending: Internal Medicine | Admitting: Internal Medicine

## 2020-12-26 ENCOUNTER — Other Ambulatory Visit: Payer: Self-pay

## 2020-12-26 ENCOUNTER — Observation Stay (HOSPITAL_COMMUNITY): Payer: MEDICARE

## 2020-12-26 DIAGNOSIS — R29818 Other symptoms and signs involving the nervous system: Secondary | ICD-10-CM | POA: Diagnosis not present

## 2020-12-26 DIAGNOSIS — G4733 Obstructive sleep apnea (adult) (pediatric): Secondary | ICD-10-CM | POA: Insufficient documentation

## 2020-12-26 DIAGNOSIS — R2990 Unspecified symptoms and signs involving the nervous system: Secondary | ICD-10-CM | POA: Diagnosis present

## 2020-12-26 DIAGNOSIS — M069 Rheumatoid arthritis, unspecified: Secondary | ICD-10-CM | POA: Diagnosis present

## 2020-12-26 DIAGNOSIS — K219 Gastro-esophageal reflux disease without esophagitis: Secondary | ICD-10-CM | POA: Diagnosis present

## 2020-12-26 DIAGNOSIS — M150 Primary generalized (osteo)arthritis: Secondary | ICD-10-CM | POA: Diagnosis not present

## 2020-12-26 DIAGNOSIS — Z20822 Contact with and (suspected) exposure to covid-19: Secondary | ICD-10-CM | POA: Insufficient documentation

## 2020-12-26 DIAGNOSIS — Z96691 Finger-joint replacement of right hand: Secondary | ICD-10-CM | POA: Diagnosis not present

## 2020-12-26 DIAGNOSIS — G459 Transient cerebral ischemic attack, unspecified: Principal | ICD-10-CM | POA: Insufficient documentation

## 2020-12-26 DIAGNOSIS — Z79899 Other long term (current) drug therapy: Secondary | ICD-10-CM | POA: Insufficient documentation

## 2020-12-26 DIAGNOSIS — H53451 Other localized visual field defect, right eye: Secondary | ICD-10-CM | POA: Diagnosis not present

## 2020-12-26 DIAGNOSIS — Z85828 Personal history of other malignant neoplasm of skin: Secondary | ICD-10-CM | POA: Insufficient documentation

## 2020-12-26 DIAGNOSIS — H547 Unspecified visual loss: Secondary | ICD-10-CM | POA: Diagnosis present

## 2020-12-26 DIAGNOSIS — F419 Anxiety disorder, unspecified: Secondary | ICD-10-CM | POA: Insufficient documentation

## 2020-12-26 LAB — RESP PANEL BY RT-PCR (FLU A&B, COVID) ARPGX2
Influenza A by PCR: NEGATIVE
Influenza B by PCR: NEGATIVE
SARS Coronavirus 2 by RT PCR: NEGATIVE

## 2020-12-26 LAB — URINALYSIS, ROUTINE W REFLEX MICROSCOPIC
Bilirubin Urine: NEGATIVE
Glucose, UA: NEGATIVE mg/dL
Ketones, ur: NEGATIVE mg/dL
Nitrite: NEGATIVE
Protein, ur: 100 mg/dL — AB
Specific Gravity, Urine: 1.013 (ref 1.005–1.030)
pH: 6.5 (ref 5.0–8.0)

## 2020-12-26 LAB — CBC WITH DIFFERENTIAL/PLATELET
Abs Immature Granulocytes: 0.03 10*3/uL (ref 0.00–0.07)
Basophils Absolute: 0 10*3/uL (ref 0.0–0.1)
Basophils Relative: 0 %
Eosinophils Absolute: 0.1 10*3/uL (ref 0.0–0.5)
Eosinophils Relative: 1 %
HCT: 34.9 % — ABNORMAL LOW (ref 36.0–46.0)
Hemoglobin: 11.6 g/dL — ABNORMAL LOW (ref 12.0–15.0)
Immature Granulocytes: 0 %
Lymphocytes Relative: 12 %
Lymphs Abs: 1.3 10*3/uL (ref 0.7–4.0)
MCH: 29.1 pg (ref 26.0–34.0)
MCHC: 33.2 g/dL (ref 30.0–36.0)
MCV: 87.5 fL (ref 80.0–100.0)
Monocytes Absolute: 1.1 10*3/uL — ABNORMAL HIGH (ref 0.1–1.0)
Monocytes Relative: 10 %
Neutro Abs: 8 10*3/uL — ABNORMAL HIGH (ref 1.7–7.7)
Neutrophils Relative %: 77 %
Platelets: 259 10*3/uL (ref 150–400)
RBC: 3.99 MIL/uL (ref 3.87–5.11)
RDW: 13 % (ref 11.5–15.5)
WBC: 10.5 10*3/uL (ref 4.0–10.5)
nRBC: 0 % (ref 0.0–0.2)

## 2020-12-26 LAB — CBC
HCT: 34.7 % — ABNORMAL LOW (ref 36.0–46.0)
Hemoglobin: 11.5 g/dL — ABNORMAL LOW (ref 12.0–15.0)
MCH: 28.8 pg (ref 26.0–34.0)
MCHC: 33.1 g/dL (ref 30.0–36.0)
MCV: 87 fL (ref 80.0–100.0)
Platelets: 247 10*3/uL (ref 150–400)
RBC: 3.99 MIL/uL (ref 3.87–5.11)
RDW: 13.1 % (ref 11.5–15.5)
WBC: 10.6 10*3/uL — ABNORMAL HIGH (ref 4.0–10.5)
nRBC: 0 % (ref 0.0–0.2)

## 2020-12-26 LAB — BASIC METABOLIC PANEL
Anion gap: 8 (ref 5–15)
BUN: 18 mg/dL (ref 8–23)
CO2: 30 mmol/L (ref 22–32)
Calcium: 9.3 mg/dL (ref 8.9–10.3)
Chloride: 99 mmol/L (ref 98–111)
Creatinine, Ser: 0.81 mg/dL (ref 0.44–1.00)
GFR, Estimated: >60 mL/min (ref >60)
Glucose, Bld: 89 mg/dL (ref 70–99)
Potassium: 3.9 mmol/L (ref 3.5–5.1)
Sodium: 137 mmol/L (ref 135–145)

## 2020-12-26 LAB — C-REACTIVE PROTEIN: CRP: 0.6 mg/dL (ref <1.0)

## 2020-12-26 LAB — SEDIMENTATION RATE: Sed Rate: 25 mm/hr — ABNORMAL HIGH (ref 0–22)

## 2020-12-26 LAB — CBG MONITORING, ED: Glucose-Capillary: 82 mg/dL (ref 70–99)

## 2020-12-26 MED ORDER — MAGNESIUM GLUCONATE 500 MG PO TABS
500.0000 mg | ORAL_TABLET | ORAL | Status: DC
  Administered 2020-12-27: 500 mg via ORAL
  Filled 2020-12-26: qty 1

## 2020-12-26 MED ORDER — ACETAMINOPHEN 650 MG RE SUPP: 650.0000 mg | RECTAL | Status: DC | PRN

## 2020-12-26 MED ORDER — LORATADINE 10 MG PO TABS: 10.0000 mg | ORAL_TABLET | Freq: Every day | ORAL | Status: DC | PRN

## 2020-12-26 MED ORDER — ENOXAPARIN SODIUM 40 MG/0.4ML IJ SOSY
40.0000 mg | PREFILLED_SYRINGE | INTRAMUSCULAR | Status: DC
  Administered 2020-12-26: 40 mg via SUBCUTANEOUS
  Filled 2020-12-26: qty 0.4

## 2020-12-26 MED ORDER — FOLIC ACID 1 MG PO TABS
1.0000 mg | ORAL_TABLET | Freq: Every day | ORAL | Status: DC
  Administered 2020-12-27: 1 mg via ORAL
  Filled 2020-12-26: qty 1

## 2020-12-26 MED ORDER — SUMATRIPTAN SUCCINATE 100 MG PO TABS
100.0000 mg | ORAL_TABLET | ORAL | Status: DC | PRN
  Filled 2020-12-26: qty 1

## 2020-12-26 MED ORDER — POLYVINYL ALCOHOL 1.4 % OP SOLN: OPHTHALMIC | Status: DC | PRN

## 2020-12-26 MED ORDER — VITAMIN D 25 MCG (1000 UNIT) PO TABS
2000.0000 [IU] | ORAL_TABLET | Freq: Every day | ORAL | Status: DC
  Administered 2020-12-27: 2000 [IU] via ORAL
  Filled 2020-12-26: qty 2

## 2020-12-26 MED ORDER — PANTOPRAZOLE SODIUM 20 MG PO TBEC
20.0000 mg | DELAYED_RELEASE_TABLET | Freq: Every day | ORAL | Status: DC
  Administered 2020-12-27: 20 mg via ORAL
  Filled 2020-12-26: qty 1

## 2020-12-26 MED ORDER — ACETAMINOPHEN 325 MG PO TABS: 650.0000 mg | ORAL_TABLET | ORAL | Status: DC | PRN

## 2020-12-26 MED ORDER — ACETAMINOPHEN 160 MG/5ML PO SOLN: 650.0000 mg | ORAL | Status: DC | PRN

## 2020-12-26 MED ORDER — SENNOSIDES-DOCUSATE SODIUM 8.6-50 MG PO TABS: 1.0000 | ORAL_TABLET | Freq: Every evening | ORAL | Status: DC | PRN

## 2020-12-26 MED ORDER — VITAMIN B-12 1000 MCG PO TABS
1000.0000 ug | ORAL_TABLET | Freq: Every day | ORAL | Status: DC
  Administered 2020-12-27: 1000 ug via ORAL
  Filled 2020-12-26: qty 1

## 2020-12-26 MED ORDER — OLOPATADINE HCL 0.1 % OP SOLN
1.0000 [drp] | Freq: Two times a day (BID) | OPHTHALMIC | Status: DC
  Administered 2020-12-26 – 2020-12-27 (×2): 1 [drp] via OPHTHALMIC
  Filled 2020-12-26: qty 5

## 2020-12-26 MED ORDER — STROKE: EARLY STAGES OF RECOVERY BOOK
Freq: Once | Status: AC
  Filled 2020-12-26: qty 1

## 2020-12-26 MED ORDER — CLOPIDOGREL BISULFATE 75 MG PO TABS
75.0000 mg | ORAL_TABLET | Freq: Every day | ORAL | Status: DC
  Administered 2020-12-26 – 2020-12-27 (×2): 75 mg via ORAL
  Filled 2020-12-26 (×3): qty 1

## 2020-12-26 NOTE — ED Notes (Signed)
Handoff report given to Alinda Dooms RN on [facility]

## 2020-12-26 NOTE — Plan of Care (Signed)
Transfer from [facility] Ms. Adair Laundry is a 75 year old female with PMH of RA on Remicade who presented with strokelike symptoms.  Reported 10-minute episode of peripheral vision loss in the right eye and subsequently a 15-minute episode of aphasia.  CT scan of the brain have been obtained with official report pending.  Labs significant for WBC 10.6 and hemoglobin 11.5.  Neurology have been consulted and recommended admission for further work-up.  She was incidentally noted to have a temperature of 100.5 F.  Patient reported that she had gotten her zoster vaccination the day before and she can off and get a low-grade fever.  ED provider to investigate for possible causes and possibly start antibiotic if needed.  ESR and CRP pending.  Patient accepted to a medical telemetry bed for further work-up and will need to notify neurology upon her arrival.

## 2020-12-26 NOTE — ED Provider Notes (Signed)
[facility] Provider Note   CSN: [id number] Arrival date & time: 12/26/20  1224     History Chief Complaint  Patient presents with   Loss of Vision    Kendra Taylor is a 75 y.o. female.  HPI     75 year old female comes in a chief complaint of loss of vision.  Patient has history of rheumatoid arthritis for which she is on Remicade.  Yesterday, patient had a 5-minute episode where she lost lateral, right-sided peripheral vision for about 5 minutes.  Few minutes later she had an episode where she was reading something to her partner, and words coming out of her mouth were incoherent, and not what she was trying to express/read.  That episode lasted for 10 to 15 minutes and resolved.  No neurologic episodes since then.  At no point did patient have any severe headache, neck pain and patient does not think her speech was slurred. No history of TIA.  Of note, patient is noted to have low-grade fever.  She does indicate that she gets low-grade temp every now and then after vaccination and had zoster shot yesterday.  She denies any cough, UTI-like symptoms.  Has had UTIs in the past however.  Past Medical History:  Diagnosis Date   Anemia    Anxiety    Arthritis    Atypical mole 01/08/2013   right inner forearm   Basal cell carcinoma 03/12/2017   right concha exc mohs   Depression    Dyspnea    GERD (gastroesophageal reflux disease)    HA (headache)    Migraine    Sleep apnea    cpap    Patient Active Problem List   Diagnosis Date Noted   Stroke-like symptoms 12/26/2020   Precordial pain    Solitary pulmonary nodule 12/18/2016   Cough 08/23/2015   Upper airway cough syndrome with classic voice fatigue >> doe  08/23/2015   Bronchiectasis without acute exacerbation ([place]) 08/23/2015   HA (headache)    Rheumatoid arthritis ([place]) 12/27/2013    Past Surgical History:  Procedure Laterality Date   ABDOMINAL HYSTERECTOMY      BUNIONECTOMY     both   FINGER ARTHROPLASTY Right 07/30/2012   Procedure: RIGHT INDEX FINGER, MIDDLE FINGER, RING FINGER, SMALL FINGER MCP ARTHROPLASTY WITH REALIGNMENT/REPAIR OF EXTENSOR TENDON;  Surgeon: Roseanne Kaufman, MD;  Location: [place];  Service: Orthopedics;  Laterality: Right;   FINGER SURGERY     HAMMER TOE SURGERY Left 03/03/2014   Procedure: SECOND THROUGH FIFTH HAMMERTOE CORRECTION;  Surgeon: Wylene Simmer, MD;  Location: [place];  Service: Orthopedics;  Laterality: Left;   JOINT REPLACEMENT     METATARSAL HEAD EXCISION Left 03/03/2014   Procedure: LEFT SECOND THROUGH FIFTH METATARSAL HEAD EXCISION;  Surgeon: Wylene Simmer, MD;  Location: [place];  Service: Orthopedics;  Laterality: Left;   OOPHORECTOMY     VIDEO BRONCHOSCOPY Bilateral 12/18/2016   Procedure: VIDEO BRONCHOSCOPY WITH FLUORO;  Surgeon: Tanda Rockers, MD;  [facility];  Service: Cardiopulmonary;  Laterality: Bilateral;   VIDEO BRONCHOSCOPY WITH ENDOBRONCHIAL NAVIGATION Left 02/26/2017   Procedure: VIDEO BRONCHOSCOPY WITH ENDOBRONCHIAL NAVIGATION;  Surgeon: Collene Gobble, MD;  [facility];  Service: Thoracic;  Laterality: Left;     OB History   No obstetric history on file.     Family History  Problem Relation Age of Onset   Dementia Mother    Sleep apnea Mother    Hyperthyroidism Mother  Dementia Father    Sleep apnea Father     Social History   Tobacco Use   Smoking status: Never   Smokeless tobacco: Never  Substance Use Topics   Alcohol use: No    Alcohol/week: 0.0 standard drinks   Drug use: No    Home Medications Prior to Admission medications   Medication Sig Start Date End Date Taking? Authorizing Provider  alendronate (FOSAMAX) 70 MG tablet TK 1 T PO ONCE A WEEK FOR 28 DAYS 09/23/17  Yes [provider]  Cholecalciferol (VITAMIN D) 2000 units tablet Take 2,000 Units by mouth daily.    Yes [provider]  folic acid  (FOLVITE) 1 MG tablet Take 1 mg by mouth daily.   Yes [provider]  inFLIXimab (REMICADE) 100 MG injection Inject into the vein every 8 (eight) weeks.    Yes [provider]  levocetirizine (XYZAL) 5 MG tablet Take 5 mg by mouth daily as needed for allergies.    Yes [provider]  magnesium gluconate (MAGONATE) 500 MG tablet Take 500 mg by mouth every other day.    Yes [provider]  methotrexate (RHEUMATREX) 2.5 MG tablet TK 6 TS PO WEEKLY 05/16/17  Yes [provider]  olopatadine (PATADAY) 0.1 % ophthalmic solution 1 drop 2 (two) times daily.   Yes [provider]  pantoprazole (PROTONIX) 20 MG tablet Take 20 mg by mouth daily.   Yes [provider]  SUMAtriptan (IMITREX) 100 MG tablet Take 100 mg by mouth every 2 (two) hours as needed for migraine.   Yes [provider]  vitamin B-12 (CYANOCOBALAMIN) 1000 MCG tablet Take 1,000 mcg by mouth daily.   Yes [provider]  fluticasone (FLONASE) 50 MCG/ACT nasal spray SMARTSIG:1-2 Spray(s) Both Nares Daily 08/28/20   [provider]  Polyethyl Glycol-Propyl Glycol (SYSTANE OP) Place 2 drops into both eyes as needed (for dry eyes).    [provider]  triamcinolone cream (KENALOG) 0.1 % Apply 1 application topically as needed (for skin irritation).    [provider]    Allergies    Aspirin  Review of Systems   Review of Systems  Constitutional:  Positive for activity change.  Eyes:  Positive for visual disturbance.  Respiratory:  Negative for cough and shortness of breath.   Genitourinary:  Negative for dysuria.  Musculoskeletal:  Negative for neck pain.  Allergic/Immunologic: Positive for immunocompromised state.  Neurological:  Positive for speech difficulty.  Hematological:  Does not bruise/bleed easily.  All other systems reviewed and are negative.  Physical Exam Updated Vital Signs BP (!) 149/97 (BP Location: Right Arm)    Pulse 88   Temp 99 F (37.2 C) (Oral)   Resp 16   Ht 5\' 2"  (1.575 m)   Wt 51.7 kg   SpO2 100%   BMI 20.85 kg/m   Physical Exam Vitals and nursing note reviewed.  Constitutional:      Appearance: She is well-developed.  HENT:     Head: Atraumatic.  Eyes:     Extraocular Movements: Extraocular movements intact.     Pupils: Pupils are equal, round, and reactive to light.  Cardiovascular:     Rate and Rhythm: Normal rate.  Pulmonary:     Effort: Pulmonary effort is normal.  Musculoskeletal:     Cervical back: Normal range of motion and neck supple.  Skin:    General: Skin is warm and dry.  Neurological:     Mental Status: She  is alert and oriented to person, place, and time.     Cranial Nerves: No cranial nerve deficit.     Sensory: No sensory deficit.     Motor: No weakness.     Coordination: Coordination normal.    ED Results / Procedures / Treatments   Labs (all labs ordered are listed, but only abnormal results are displayed) Labs Reviewed  CBC - Abnormal; Notable for the following components:      Result Value   WBC 10.6 (*)    Hemoglobin 11.5 (*)    HCT 34.7 (*)    All other components within normal limits  URINALYSIS, ROUTINE W REFLEX MICROSCOPIC - Abnormal; Notable for the following components:   Hgb urine dipstick LARGE (*)    Protein, ur 100 (*)    Leukocytes,Ua SMALL (*)    All other components within normal limits  CBC WITH DIFFERENTIAL/PLATELET - Abnormal; Notable for the following components:   Hemoglobin 11.6 (*)    HCT 34.9 (*)    Neutro Abs 8.0 (*)    Monocytes Absolute 1.1 (*)    All other components within normal limits  RESP PANEL BY RT-PCR (FLU A&B, COVID) [id number]  URINE CULTURE  BASIC METABOLIC PANEL  SEDIMENTATION RATE  C-REACTIVE PROTEIN  CBG MONITORING, ED  CBG MONITORING, ED    EKG EKG Interpretation  Date/Time:  Tuesday December 26 2020 12:38:39 EDT Ventricular Rate:  97 PR Interval:  156 QRS Duration: 90 QT  Interval:  362 QTC Calculation: 459 R Axis:   78 Text Interpretation: Normal sinus rhythm Normal ECG No acute changes No significant change since last tracing Confirmed by Varney Biles [id number]) on 12/26/2020 3:51:58 PM  Radiology CT HEAD WO CONTRAST  Result Date: 12/26/2020 CLINICAL DATA:  TIA; 10-15 minute episode last night of losing peripheral vision in right eye EXAM: CT HEAD WITHOUT CONTRAST TECHNIQUE: Contiguous axial images were obtained from the base of the skull through the vertex without intravenous contrast. COMPARISON:  CT head 04/19/2014 FINDINGS: Brain: There is no evidence of acute intracranial hemorrhage, extra-axial fluid collection, or infarct. There is mild parenchymal volume loss with prominence of the ventricular system, unchanged. There is no mass lesion. There is no midline shift. Vascular: There is calcification of the bilateral cavernous ICAs. Skull: Normal. Negative for fracture or focal lesion. Sinuses/Orbits: The imaged paranasal sinuses are clear. The imaged globes and orbits are unremarkable. Other: There is trace fluid in the right mastoid tip. IMPRESSION: No acute intracranial pathology. Electronically Signed   By: Valetta Mole M.D.   On: 12/26/2020 14:27    Procedures Procedures   Medications Ordered in ED Medications - No data to display  ED Course  I have reviewed the triage vital signs and the nursing notes.  Pertinent labs & imaging results that were available during my care of the patient were reviewed by me and considered in my medical decision making (see chart for details).    MDM Rules/Calculators/A&P                           75 year old female comes in with chief complaint of visual deficit and what appears to be aphasia.  Episodes occurred yesterday, 10 to 15 minutes each.  Clinical concerns are for stroke versus TIA.  ABCD 2 score is 4.  Spoke with Dr. Kathrynn Speed, recommends that patient be admitted for comprehensive TIA  work-up.  Does not appear to be complex migraine.  Additionally, patient  is noted to have low-grade fever.  COVID-19 test sent.  Could be postvaccine reaction as she just had shingles yesterday.  Given that she is immunosuppressed however, we have added urine culture to her work-up.  Hesitant in getting blood cultures at this time as patient is not toxic appearing.  Urine culture will have to be assessed with clinical story.  Will not commit to antibiotics at this time.  Final Clinical Impression(s) / ED Diagnoses Final diagnoses:  TIA (transient ischemic attack)    Rx / DC Orders ED Discharge Orders     None        Varney Biles, MD 12/26/20 1553

## 2020-12-26 NOTE — Consult Note (Signed)
NEUROLOGY CONSULTATION NOTE   Date of service: December 26, 2020 Patient Name: Kendra Taylor MRN:  [id number] DOB:  1945-08-07 Reason for consult: "episode of vision deficit and aphasia" Requesting Provider: Marcelyn Bruins, MD _ _ _   _ __   _ __ _ _  __ __   _ __   __ _  History of Present Illness  Kendra Taylor is a 75 y.o. female with PMH significant for bronchietasis, Rheumatoid arthritis, GERD, headache, sleep apnea who was on her computer last night when she had sudden onset R inferior temporal quadrant vision loss. This lasted about 15 mins. This resolved and she went to read her favorite book and was having trouble with words. She was trying to say a word and something else would come out. Her speech was incoherent and not making much sense. She knew what she wanted to say. This lasted about 15 mins and then resolved. She reached out to her PCP on insistance of her friends and was advised to come to the ED.  No prior hx of strokes or TIA, grand mother had stroke, no hx of diabetes or high cholesterol. Has been told that her blood pressure has been high lately. No hx of afibb. Does not smoke, does not use any recreational drugs.  Workup with CTH with no acute abnormality, MRI brain with no acute stroke.  mRS: 0 tPA/thrombectomy: symptoms resolved. NIHSS components Score: Comment  1a Level of Conscious 0[x]  1[]  2[]  3[]      1b LOC Questions 0[x]  1[]  2[]       1c LOC Commands 0[x]  1[]  2[]       2 Best Gaze 0[x]  1[]  2[]       3 Visual 0[x]  1[]  2[]  3[]      4 Facial Palsy 0[x]  1[]  2[]  3[]      5a Motor Arm - left 0[x]  1[]  2[]  3[]  4[]  UN[]    5b Motor Arm - Right 0[x]  1[]  2[]  3[]  4[]  UN[]    6a Motor Leg - Left 0[x]  1[]  2[]  3[]  4[]  UN[]    6b Motor Leg - Right 0[x]  1[]  2[]  3[]  4[]  UN[]    7 Limb Ataxia 0[x]  1[]  2[]  3[]  UN[]     8 Sensory 0[x]  1[]  2[]  UN[]      9 Best Language 0[x]  1[]  2[]  3[]      10 Dysarthria 0[x]  1[]  2[]  UN[]      11 Extinct. and Inattention 0[x]  1[]  2[]        TOTAL: 0     ROS   Constitutional Denies weight loss, fever and chills.   HEENT Denies changes in vision and hearing.   Respiratory Denies SOB and cough.   CV Denies palpitations and CP   GI Denies abdominal pain, nausea, vomiting and diarrhea.   GU Denies dysuria and urinary frequency.   MSK Denies myalgia and joint pain.   Skin Denies rash and pruritus.   Neurological Denies headache and syncope.   Psychiatric Denies recent changes in mood. Denies anxiety and depression.    Past History   Past Medical History:  Diagnosis Date   Anemia    Anxiety    Arthritis    Atypical mole 01/08/2013   right inner forearm   Basal cell carcinoma 03/12/2017   right concha exc mohs   Depression    Dyspnea    GERD (gastroesophageal reflux disease)    HA (headache)    Migraine    Sleep apnea    cpap   Past Surgical History:  Procedure Laterality  Date   ABDOMINAL HYSTERECTOMY     BUNIONECTOMY     both   FINGER ARTHROPLASTY Right 07/30/2012   Procedure: RIGHT INDEX FINGER, MIDDLE FINGER, RING FINGER, SMALL FINGER MCP ARTHROPLASTY WITH REALIGNMENT/REPAIR OF EXTENSOR TENDON;  Surgeon: Roseanne Kaufman, MD;  Location: [facility];  Service: Orthopedics;  Laterality: Right;   FINGER SURGERY     HAMMER TOE SURGERY Left 03/03/2014   Procedure: SECOND THROUGH FIFTH HAMMERTOE CORRECTION;  Surgeon: Wylene Simmer, MD;  Location: [facility];  Service: Orthopedics;  Laterality: Left;   JOINT REPLACEMENT     METATARSAL HEAD EXCISION Left 03/03/2014   Procedure: LEFT SECOND THROUGH FIFTH METATARSAL HEAD EXCISION;  Surgeon: Wylene Simmer, MD;  Location: [facility];  Service: Orthopedics;  Laterality: Left;   OOPHORECTOMY     VIDEO BRONCHOSCOPY Bilateral 12/18/2016   Procedure: VIDEO BRONCHOSCOPY WITH FLUORO;  Surgeon: Tanda Rockers, MD;  Location: [facility];  Service: Cardiopulmonary;  Laterality: Bilateral;   VIDEO BRONCHOSCOPY WITH ENDOBRONCHIAL NAVIGATION Left 02/26/2017    Procedure: VIDEO BRONCHOSCOPY WITH ENDOBRONCHIAL NAVIGATION;  Surgeon: Collene Gobble, MD;  Location: [facility];  Service: Thoracic;  Laterality: Left;   Family History  Problem Relation Age of Onset   Dementia Mother    Sleep apnea Mother    Hyperthyroidism Mother    Dementia Father    Sleep apnea Father    Social History   Socioeconomic History   Marital status: Married    Spouse name: Remo Lipps   Number of children: 0   Years of education: masters   Highest education level: Not on file  Occupational History    Comment: Retired  Tobacco Use   Smoking status: Never   Smokeless tobacco: Never  Vaping Use   Vaping Use: Not on file  Substance and Sexual Activity   Alcohol use: No    Alcohol/week: 0.0 standard drinks   Drug use: No   Sexual activity: Not on file  Other Topics Concern   Not on file  Social History Narrative   Patient lives at home with her spouse (Paltrineri Remo Lipps.) Patient has two cats.   Retired.   Arboriculturist.   Right handed.   Caffeine One cup of coffee daily.   Social Determinants of Health   Financial Resource Strain: Not on file  Food Insecurity: Not on file  Transportation Needs: Not on file  Physical Activity: Not on file  Stress: Not on file  Social Connections: Not on file   Allergies  Allergen Reactions   Aspirin Other (See Comments)    Ringing in ears with large doses, large doses causes her to lose hearing    Medications   Medications Prior to Admission  Medication Sig Dispense Refill Last Dose   alendronate (FOSAMAX) 70 MG tablet TK 1 T PO ONCE A WEEK FOR 28 DAYS  3 Past Week   Cholecalciferol (VITAMIN D) 2000 units tablet Take 2,000 Units by mouth daily.    5/85/2778   folic acid (FOLVITE) 1 MG tablet Take 1 mg by mouth daily.   12/25/2020   inFLIXimab (REMICADE) 100 MG injection Inject into the vein every 8 (eight) weeks.    Past Month   levocetirizine (XYZAL) 5 MG tablet Take 5 mg by mouth daily as needed for allergies.    Past  Week   magnesium gluconate (MAGONATE) 500 MG tablet Take 500 mg by mouth every other day.    12/25/2020   methotrexate (RHEUMATREX) 2.5 MG tablet TK 6  TS PO WEEKLY   Past Week   olopatadine (PATADAY) 0.1 % ophthalmic solution 1 drop 2 (two) times daily.   12/25/2020   pantoprazole (PROTONIX) 20 MG tablet Take 20 mg by mouth daily.   12/25/2020   SUMAtriptan (IMITREX) 100 MG tablet Take 100 mg by mouth every 2 (two) hours as needed for migraine.   Past Week   vitamin B-12 (CYANOCOBALAMIN) 1000 MCG tablet Take 1,000 mcg by mouth daily.   12/25/2020   fluticasone (FLONASE) 50 MCG/ACT nasal spray SMARTSIG:1-2 Spray(s) Both Nares Daily      Polyethyl Glycol-Propyl Glycol (SYSTANE OP) Place 2 drops into both eyes as needed (for dry eyes).      triamcinolone cream (KENALOG) 0.1 % Apply 1 application topically as needed (for skin irritation).        Vitals   Vitals:   12/26/20 1420 12/26/20 1645 12/26/20 1900 12/26/20 2130  BP: (!) 149/97 (!) 160/98 (!) 174/92 (!) 158/91  Pulse: 88 90 82 78  Resp: 16 16 16 18   Temp: 99 F (37.2 C) 97.8 F (36.6 C) 98.1 F (36.7 C) 99.1 F (37.3 C)  TempSrc: Oral Oral Oral Oral  SpO2: 100% 100% 100% 100%  Weight:      Height:         Body mass index is 20.85 kg/m.  Physical Exam   General: Laying comfortably in bed; in no acute distress.  HENT: Normal oropharynx and mucosa. Normal external appearance of ears and nose.  Neck: Supple, no pain or tenderness  CV: No JVD. No peripheral edema.  Pulmonary: Symmetric Chest rise. Normal respiratory effort.  Abdomen: Soft to touch, non-tender.  Ext: No cyanosis, edema, or deformity  Skin: No rash. Normal palpation of skin.   Musculoskeletal: Normal digits and nails by inspection. No clubbing.   Neurologic Examination  Mental status/Cognition: Alert, oriented to self, place, month and year, good attention.  Speech/language: Fluent, comprehension intact, object naming intact, repetition intact.  Cranial  nerves:   CN II Pupils equal and reactive to light, no VF deficits    CN III,IV,VI EOM intact, no gaze preference or deviation, no nystagmus    CN V normal sensation in V1, V2, and V3 segments bilaterally    CN VII no asymmetry, no nasolabial fold flattening    CN VIII normal hearing to speech    CN IX & X normal palatal elevation, no uvular deviation    CN XI 5/5 head turn and 5/5 shoulder shrug bilaterally    CN XII midline tongue protrusion    Motor:  Muscle bulk: normal, tone normal, pronator drift none tremor none Mvmt Root Nerve  Muscle Right Left Comments  SA C5/6 Ax Deltoid 5 5   EF C5/6 Mc Biceps 5 5   EE C6/7/8 Rad Triceps 5 5   WF C6/7 Med FCR     WE C7/8 PIN ECU     F Ab C8/T1 U ADM/FDI 4 4 Has RA involving BL hands.  HF L1/2/3 Fem Illopsoas 5 5   KE L2/3/4 Fem Quad 5 5   DF L4/5 D Peron Tib Ant 5 5   PF S1/2 Tibial Grc/Sol 5 5    Reflexes:  Right Left Comments  Pectoralis      Biceps (C5/6) 1 1   Brachioradialis (C5/6) 1 1    Triceps (C6/7) 0 0    Patellar (L3/4) 1 1    Achilles (S1) 0 0    Hoffman  Plantar     Jaw jerk    Sensation:  Light touch intact   Pin prick    Temperature    Vibration   Proprioception    Coordination/Complex Motor:  - Finger to Nose intact - Heel to shin intact - Rapid alternating movement are normal - Gait: Deferred.  Labs   CBC:  Recent Labs  Lab 12/26/20 1242  WBC 10.5  10.6*  NEUTROABS 8.0*  HGB 11.6*  11.5*  HCT 34.9*  34.7*  MCV 87.5  87.0  PLT 259  962    Basic Metabolic Panel:  Lab Results  Component Value Date   NA 137 12/26/2020   K 3.9 12/26/2020   CO2 30 12/26/2020   GLUCOSE 89 12/26/2020   BUN 18 12/26/2020   CREATININE 0.81 12/26/2020   CALCIUM 9.3 12/26/2020   GFRNONAA >60 12/26/2020   GFRAA 84 (L) 07/22/2012   Lipid Panel: No results found for: LDLCALC HgbA1c: No results found for: HGBA1C Urine Drug Screen: No results found for: LABOPIA, COCAINSCRNUR, LABBENZ, AMPHETMU, THCU,  LABBARB  Alcohol Level No results found for: [place]  CT Head without contrast(personally reviewed): CTH was negative for a large hypodensity concerning for a large territory infarct or hyperdensity concerning for an ICH  MR Angio head without contrast and Carotid Duplex BL: No LVO on MR angio head. Carotid duplex is pending.  MRI Brain(personally reviewed): No acute abnormality  Impression   Kendra Taylor is a 75 y.o. female with PMH significant for bronchietasis, Rheumatoid arthritis, GERD, headache, sleep apnea who had sudden onset R inferior temporal quadrant vision loss. This lasted about 15 mins and self resolved. Followed by an episode of trouble with words which lasted about 87mins and self resolved. Her neurologic examination is notable for no focal deficit.  Likely these symptoms are TIA.  Primary Diagnosis:  Cerebral infarction, unspecified.   Recommendations  Plan:  Recommend that primary team order following: - Frequent Neuro checks per stroke unit protocol - Recommend brain imaging with MRI Brain without contrast - Recommend Vascular imaging with MRA Angio Head without contrast and US Carotid doppler - TTE in May 2022 with EF of 62%. - Recommend obtaining Lipid panel with LDL - Please start statin if LDL > 70 - Recommend HbA1c - Antithrombotic - Aspirin 81mg  daily - Recommend DVT ppx - SBP goal - permissive hypertension first 24 h < 220/110. Held home meds.  - Recommend Telemetry monitoring for arrythmia - Recommend bedside swallow screen prior to PO intake. - Stroke education booklet - Recommend PT/OT/SLP consult  __________________________________________________________________   Thank you for the opportunity to take part in the care of this patient. If you have any further questions, please contact the neurology consultation attending.  Signed,  Lester Pager Number [phone] _ _ _   _ __   _ __ _ _  __ __    _ __   __ _

## 2020-12-26 NOTE — H&P (Signed)
History and Physical   Kendra Taylor XBJ:[id number] DOB: Nov 04, 1945 DOA: 12/26/2020  PCP: Deland Pretty, MD   Patient coming from: Home  Chief Complaint: Vision loss and aphasia  HPI: Kendra Taylor is a 75 y.o. female with medical history significant of bronchiectasis, RA, LA, anxiety, GERD, LPR, OSA presenting with recent episodes of vision loss and aphasia.  She states yesterday evening she had a 10-minute loss in her peripheral vision of her right eye in the lower field.  She also had a 15-minute episode of aphasia.  The seizure manifest itself when she was reading aloud and she was incoherent sounding and unable to get the correct words out.  All of her symptoms have resolved.  She denies headache, recent cough.  She does note that she was vaccinated against zoster yesterday and can get some fevers with that.  She denies chest pain, shortness of breath, Donnell pain, constipation, diarrhea, nausea, vomiting.   ED Course: Vital signs in the ED significant for blood pressure in the 308M to 578 systolic with heart rate in the 80s to 100s.  Single temperature elevation was noted to be 100.5.  Lab work-up showed BMP within normal limits.  CBC with mild leukocytosis with 2 values of 10.5 and of 10.6, hemoglobin 11.5 down from 13 3 years ago.  ESR borderline elevated at 25 and CRP pending.  Respiratory panel for flu COVID-negative urinalysis showing only small leukocytes and urine cultures pending.  CT head without acute normality.  Neurology was consulted and recommended admission for TIA work-up.  Review of Systems: As per HPI otherwise all other systems reviewed and are negative.  Past Medical History:  Diagnosis Date   Anemia    Anxiety    Arthritis    Atypical mole 01/08/2013   right inner forearm   Basal cell carcinoma 03/12/2017   right concha exc mohs   Depression    Dyspnea    GERD (gastroesophageal reflux disease)    HA (headache)    Migraine    Sleep apnea     cpap    Past Surgical History:  Procedure Laterality Date   ABDOMINAL HYSTERECTOMY     BUNIONECTOMY     both   FINGER ARTHROPLASTY Right 07/30/2012   Procedure: RIGHT INDEX FINGER, MIDDLE FINGER, RING FINGER, SMALL FINGER MCP ARTHROPLASTY WITH REALIGNMENT/REPAIR OF EXTENSOR TENDON;  Surgeon: Roseanne Kaufman, MD;  Location: [place];  Service: Orthopedics;  Laterality: Right;   FINGER SURGERY     HAMMER TOE SURGERY Left 03/03/2014   Procedure: SECOND THROUGH FIFTH HAMMERTOE CORRECTION;  Surgeon: Wylene Simmer, MD;  Location: [facility];  Service: Orthopedics;  Laterality: Left;   JOINT REPLACEMENT     METATARSAL HEAD EXCISION Left 03/03/2014   Procedure: LEFT SECOND THROUGH FIFTH METATARSAL HEAD EXCISION;  Surgeon: Wylene Simmer, MD;  Location: [place];  Service: Orthopedics;  Laterality: Left;   OOPHORECTOMY     VIDEO BRONCHOSCOPY Bilateral 12/18/2016   Procedure: VIDEO BRONCHOSCOPY WITH FLUORO;  Surgeon: Tanda Rockers, MD;  Location: [facility];  Service: Cardiopulmonary;  Laterality: Bilateral;   VIDEO BRONCHOSCOPY WITH ENDOBRONCHIAL NAVIGATION Left 02/26/2017   Procedure: VIDEO BRONCHOSCOPY WITH ENDOBRONCHIAL NAVIGATION;  Surgeon: Collene Gobble, MD;  Location: [place];  Service: Thoracic;  Laterality: Left;    Social History  reports that she has never smoked. She has never used smokeless tobacco. She reports that she does not drink alcohol and does not use drugs.  Allergies  Allergen Reactions  Aspirin Other (See Comments)    Ringing in ears with large doses, large doses causes her to lose hearing    Family History  Problem Relation Age of Onset   Dementia Mother    Sleep apnea Mother    Hyperthyroidism Mother    Dementia Father    Sleep apnea Father   Reviewed on admission  Prior to Admission medications   Medication Sig Start Date End Date Taking? Authorizing Provider  alendronate (FOSAMAX) 70 MG tablet TK 1 T PO ONCE A WEEK FOR 28 DAYS  09/23/17  Yes [provider]  Cholecalciferol (VITAMIN D) 2000 units tablet Take 2,000 Units by mouth daily.    Yes [provider]  folic acid (FOLVITE) 1 MG tablet Take 1 mg by mouth daily.   Yes [provider]  inFLIXimab (REMICADE) 100 MG injection Inject into the vein every 8 (eight) weeks.    Yes [provider]  levocetirizine (XYZAL) 5 MG tablet Take 5 mg by mouth daily as needed for allergies.    Yes [provider]  magnesium gluconate (MAGONATE) 500 MG tablet Take 500 mg by mouth every other day.    Yes [provider]  methotrexate (RHEUMATREX) 2.5 MG tablet TK 6 TS PO WEEKLY 05/16/17  Yes [provider]  olopatadine (PATADAY) 0.1 % ophthalmic solution 1 drop 2 (two) times daily.   Yes [provider]  pantoprazole (PROTONIX) 20 MG tablet Take 20 mg by mouth daily.   Yes [provider]  SUMAtriptan (IMITREX) 100 MG tablet Take 100 mg by mouth every 2 (two) hours as needed for migraine.   Yes [provider]  vitamin B-12 (CYANOCOBALAMIN) 1000 MCG tablet Take 1,000 mcg by mouth daily.   Yes [provider]  fluticasone (FLONASE) 50 MCG/ACT nasal spray SMARTSIG:1-2 Spray(s) Both Nares Daily 08/28/20   [provider]  Polyethyl Glycol-Propyl Glycol (SYSTANE OP) Place 2 drops into both eyes as needed (for dry eyes).    [provider]  triamcinolone cream (KENALOG) 0.1 % Apply 1 application topically as needed (for skin irritation).    [provider]    Physical Exam: Vitals:   12/26/20 1238 12/26/20 1330 12/26/20 1420 12/26/20 1645  BP: (!) 168/101 (!) 146/94 (!) 149/97 (!) 160/98  Pulse: 97 91 88 90  Resp:  '15 16 16  ' Temp:   99 F (37.2 C) 97.8 F (36.6 C)  TempSrc:   Oral Oral  SpO2: 98% 99% 100% 100%  Weight:      Height:       Physical Exam Constitutional:      General: She is not in acute distress.    Appearance: Normal appearance.  HENT:      Head: Normocephalic and atraumatic.     Mouth/Throat:     Mouth: Mucous membranes are moist.     Pharynx: Oropharynx is clear.  Eyes:     Extraocular Movements: Extraocular movements intact.     Pupils: Pupils are equal, round, and reactive to light.  Cardiovascular:     Rate and Rhythm: Normal rate and regular rhythm.     Pulses: Normal pulses.     Heart sounds: Normal heart sounds.  Pulmonary:     Effort: Pulmonary effort is normal. No respiratory distress.     Breath sounds: Normal breath sounds.  Abdominal:     General: Bowel sounds are normal. There is no distension.     Palpations: Abdomen is soft.  Tenderness: There is no abdominal tenderness.  Musculoskeletal:        General: No swelling or deformity.  Skin:    General: Skin is warm and dry.  Mental Status: Patient is awake, alert, oriented x3 No signs of aphasia or neglect Cranial Nerves: II: Pupils equal, round, and reactive to light.   III,IV, VI: EOMI without ptosis or diploplia.  V: Facial sensation is symmetric to light touch and  Temperature. VII: Facial movement is symmetric.  VIII: hearing is intact to voice X: Uvula elevates symmetrically XI: Shoulder shrug is symmetric. XII: tongue is midline without atrophy or fasciculations.  Motor: Good effort thorughout, at Least 5/5 bilateral UE, 5/5 bilateral lower extremitiy  Sensory: Sensation is grossly intact bilateral UEs & LEs Cerebellar: Finger-Nose intact bilalat   Labs on Admission: I have personally reviewed following labs and imaging studies  CBC: Recent Labs  Lab 12/26/20 1242  WBC 10.5  10.6*  NEUTROABS 8.0*  HGB 11.6*  11.5*  HCT 34.9*  34.7*  MCV 87.5  87.0  PLT 259  696    Basic Metabolic Panel: Recent Labs  Lab 12/26/20 1242  NA 137  K 3.9  CL 99  CO2 30  GLUCOSE 89  BUN 18  CREATININE 0.81  CALCIUM 9.3    GFR: Estimated Creatinine Clearance: 46 mL/min (by C-G formula based on SCr of 0.81 mg/dL).  Liver Function  Tests: No results for input(s): AST, ALT, ALKPHOS, BILITOT, PROT, ALBUMIN in the last 168 hours.  Urine analysis:    Component Value Date/Time   COLORURINE YELLOW 12/26/2020 [place] 12/26/2020 1354   LABSPEC 1.013 12/26/2020 1354   PHURINE 6.5 12/26/2020 1354   GLUCOSEU NEGATIVE 12/26/2020 1354   HGBUR LARGE (A) 12/26/2020 1354   BILIRUBINUR NEGATIVE 12/26/2020 1354   BILIRUBINUR negative 09/29/2017 1205   BILIRUBINUR neg 12/25/2013 1007   KETONESUR NEGATIVE 12/26/2020 1354   PROTEINUR 100 (A) 12/26/2020 1354   UROBILINOGEN 0.2 09/29/2017 1205   NITRITE NEGATIVE 12/26/2020 1354   LEUKOCYTESUR SMALL (A) 12/26/2020 1354    Radiological Exams on Admission: CT HEAD WO CONTRAST  Result Date: 12/26/2020 CLINICAL DATA:  TIA; 10-15 minute episode last night of losing peripheral vision in right eye EXAM: CT HEAD WITHOUT CONTRAST TECHNIQUE: Contiguous axial images were obtained from the base of the skull through the vertex without intravenous contrast. COMPARISON:  CT head 04/19/2014 FINDINGS: Brain: There is no evidence of acute intracranial hemorrhage, extra-axial fluid collection, or infarct. There is mild parenchymal volume loss with prominence of the ventricular system, unchanged. There is no mass lesion. There is no midline shift. Vascular: There is calcification of the bilateral cavernous ICAs. Skull: Normal. Negative for fracture or focal lesion. Sinuses/Orbits: The imaged paranasal sinuses are clear. The imaged globes and orbits are unremarkable. Other: There is trace fluid in the right mastoid tip. IMPRESSION: No acute intracranial pathology. Electronically Signed   By: Valetta Mole M.D.   On: 12/26/2020 14:27    EKG: Independently reviewed.  Sinus rhythm at 97 bpm.  RSR prime V1 V2.  Similar to previous.  Assessment/Plan Principal Problem:   Stroke-like symptoms Active Problems:   Rheumatoid arthritis (HCC)  TIA Versus CVA > Patient presented following episodes  of focal neurologic deficits of 10-minute episode of peripheral vision loss and 50-minute episode of aphasia.  Both episodes have resolved completely. > CT head in the ED without any acute abnormality. > Neurology consulted in the ED and will be notified the  patient has arrived - Appreciate neurology recommendations - Allow for permissive HTN  (systolic < 151 and diastolic < 761)  - Hold off on aspirin as she has listed adverse reaction of tinnitus - May benefit from statin - MRI Brain - Echocardiogram  - Carotid doppler  - A1C  - Lipid panel  - Tele monitoring  - SLP eval - PT/OT  Leukocytosis Fever > Borderline leukocytosis with 2 different labs showing 10.5 and 10.6.  Low-grade fever at 100.5 in the ED.  > She states that she did get a zoster vaccine yesterday and she does sometimes gets fevers with this.  > No urinary symptoms or respiratory symptoms.  Urinalysis with only small leuks.  Urine culture pending. - We will continue to monitor for now - Follow-up urine culture - Trend fever curve and white count  RA OA - On Remicade and methotrexate outpatient  GERD LPR - Continue home PPI   DVT prophylaxis: Lovenox  Code Status:   Full Family Communication:  None on admission Disposition Plan:   Patient is from:  Home  Anticipated DC to:  Home  Anticipated DC date:  1 to 2 days  Anticipated DC barriers: None  Consults called:  Neurology, consulted by EDP.  Will notify neurology if patient has arrived.  Admission status:  Observation, telemetry   Severity of Illness: The appropriate patient status for this patient is OBSERVATION. Observation status is judged to be reasonable and necessary in order to provide the required intensity of service to ensure the patient's safety. The patient's presenting symptoms, physical exam findings, and initial radiographic and laboratory data in the context of their medical condition is felt to place them at decreased risk for further  clinical deterioration. Furthermore, it is anticipated that the patient will be medically stable for discharge from the hospital within 2 midnights of admission. The following factors support the patient status of observation.   " The patient's presenting symptoms include vision loss, aphasia.. " The physical exam findings include stable physical exam. " The initial radiographic and laboratory data are Lab work-up showed BMP within normal limits.  CBC with mild leukocytosis with 2 values of 10.5 and of 10.6, hemoglobin 11.5 down from 13 3 years ago.  ESR borderline elevated at 25 and CRP pending.  Respiratory panel for flu COVID-negative urinalysis showing only small leukocytes and urine cultures pending.  CT head without acute normality.   Marcelyn Bruins MD [facility]  How to contact the [facility] Attending or Consulting provider [place] or covering provider during after hours [place], for this patient?   Check the care team in [facility] and look for a) attending/consulting [facility] provider listed and b) the [facility] team listed Log into [email or web address] and use [place]'s universal password to access. If you do not have the password, please contact the hospital operator. Locate the [facility] provider you are looking for under [facility] and page to a number that you can be directly reached. If you still have difficulty reaching the provider, please page the [facility] (Director on Call) for the Hospitalists listed on amion for assistance.  12/26/2020, 6:16 PM

## 2020-12-26 NOTE — Progress Notes (Signed)
Patient arrived on a stretcher. Ambulated to bed in patient room. HTN, otherwise VSS. Assessment completed and benign. Admission completed. Provider notified of patient's arrival. Patient oriented to room and unit. Bed locked and lock, call bell and immediate needs within reach. Will continue to monitor.

## 2020-12-26 NOTE — ED Notes (Signed)
Handoff report given to [facility] 

## 2020-12-26 NOTE — ED Triage Notes (Signed)
Pt arrives POV, states she had a 10-15 minute episode last evening when she lost her peripheral vision in the right eye only.  The vision later returned and she is not having any loss of vision today.  She reports she also had and episode of "slurred speech" last evening.  She received her initial shingles vaccine yesterday.  She presents today A&O x4 in NAD, walked normally from waiting room to exam room.

## 2020-12-27 ENCOUNTER — Observation Stay (HOSPITAL_COMMUNITY): Payer: MEDICARE

## 2020-12-27 ENCOUNTER — Observation Stay (HOSPITAL_BASED_OUTPATIENT_CLINIC_OR_DEPARTMENT_OTHER): Payer: MEDICARE

## 2020-12-27 DIAGNOSIS — M150 Primary generalized (osteo)arthritis: Secondary | ICD-10-CM | POA: Diagnosis not present

## 2020-12-27 DIAGNOSIS — R2990 Unspecified symptoms and signs involving the nervous system: Secondary | ICD-10-CM

## 2020-12-27 DIAGNOSIS — G459 Transient cerebral ischemic attack, unspecified: Secondary | ICD-10-CM | POA: Diagnosis not present

## 2020-12-27 DIAGNOSIS — K219 Gastro-esophageal reflux disease without esophagitis: Secondary | ICD-10-CM | POA: Diagnosis not present

## 2020-12-27 LAB — LIPID PANEL
Cholesterol: 182 mg/dL (ref 0–200)
HDL: 55 mg/dL (ref >40)
LDL Cholesterol: 110 mg/dL — ABNORMAL HIGH (ref 0–99)
Total CHOL/HDL Ratio: 3.3 RATIO
Triglycerides: 83 mg/dL (ref <150)
VLDL: 17 mg/dL (ref 0–40)

## 2020-12-27 LAB — C-REACTIVE PROTEIN: CRP: 1 mg/dL — ABNORMAL HIGH (ref <1.0)

## 2020-12-27 LAB — URINE CULTURE: Culture: NO GROWTH

## 2020-12-27 LAB — HEMOGLOBIN A1C
Hgb A1c MFr Bld: 5.3 % (ref 4.8–5.6)
Mean Plasma Glucose: 105 mg/dL

## 2020-12-27 LAB — SEDIMENTATION RATE: Sed Rate: 22 mm/hr (ref 0–22)

## 2020-12-27 MED ORDER — ENSURE ENLIVE PO LIQD: 237.0000 mL | Freq: Two times a day (BID) | ORAL | Status: DC

## 2020-12-27 MED ORDER — ASPIRIN 81 MG PO TBEC: 81.0000 mg | DELAYED_RELEASE_TABLET | Freq: Every day | ORAL | 11 refills | Status: DC

## 2020-12-27 MED ORDER — ATORVASTATIN CALCIUM 80 MG PO TABS
80.0000 mg | ORAL_TABLET | Freq: Every day | ORAL | Status: DC
  Administered 2020-12-27: 80 mg via ORAL
  Filled 2020-12-27: qty 1

## 2020-12-27 MED ORDER — ATORVASTATIN CALCIUM 80 MG PO TABS: 80.0000 mg | ORAL_TABLET | Freq: Every day | ORAL | 1 refills | Status: AC

## 2020-12-27 MED ORDER — ASPIRIN EC 81 MG PO TBEC
81.0000 mg | DELAYED_RELEASE_TABLET | Freq: Every day | ORAL | Status: DC
  Administered 2020-12-27: 81 mg via ORAL
  Filled 2020-12-27: qty 1

## 2020-12-27 NOTE — Progress Notes (Signed)
Initial Nutrition Assessment  DOCUMENTATION CODES:   Not applicable  INTERVENTION:  Provide Ensure Enlive po BID, each supplement provides 350 kcal and 20 grams of protein  Encourage adequate PO intake.  NUTRITION DIAGNOSIS:   Increased nutrient needs related to acute illness as evidenced by estimated needs.  GOAL:   Patient will meet greater than or equal to 90% of their needs  MONITOR:   PO intake, Supplement acceptance, Labs, Weight trends, Skin, I & O's  REASON FOR ASSESSMENT:   Malnutrition Screening Tool    ASSESSMENT:   75 y.o. female with medical history significant of bronchiectasis, RA, LA, anxiety, GERD, LPR, OSA presenting with recent episodes of vision loss and aphasia. Per MD, possible temporal arteritis versus atypical migraine.  Pt reports having a fair appetite. Pt reports usual consumption of at least 3 meals a day with snacks in between. Pt with no significant weight loss per weight records. RD to order nutritional supplements to aid in caloric and protein needs. Pt encouraged to eat her food at meals.   NUTRITION - FOCUSED PHYSICAL EXAM:   Depletion may be related to the natural aging process.  Flowsheet Row Most Recent Value  Orbital Region Unable to assess  Upper Arm Region Moderate depletion  Thoracic and Lumbar Region Unable to assess  Buccal Region Unable to assess  Temple Region Unable to assess  Clavicle Bone Region Moderate depletion  Clavicle and Acromion Bone Region Moderate depletion  Scapular Bone Region Unable to assess  Dorsal Hand Unable to assess  Patellar Region No depletion  Anterior Thigh Region No depletion  Posterior Calf Region No depletion  Edema (RD Assessment) None  Hair Reviewed  Eyes Reviewed  Mouth Reviewed  Skin Reviewed  Nails Unable to assess      Labs and medications reviewed.   Diet Order:   Diet Order             Diet regular Room service appropriate? Yes; Fluid consistency: Thin  Diet effective  now                   EDUCATION NEEDS:   Not appropriate for education at this time  Skin:  Skin Assessment: Reviewed RN Assessment  Last BM:  8/30  Height:   Ht Readings from Last 1 Encounters:  12/26/20 5\' 2"  (1.575 m)    Weight:   Wt Readings from Last 1 Encounters:  12/26/20 51.7 kg   BMI:  Body mass index is 20.85 kg/m.  Estimated Nutritional Needs:   Kcal:  1600-1800  Protein:  75-85 grams  Fluid:  >/= 1.6 L/day  Corrin Parker, MS, RD, LDN RD pager number/after hours weekend pager number on Amion.

## 2020-12-27 NOTE — Discharge Summary (Signed)
Physician Discharge Summary  Kendra Taylor VOH:[id number] DOB: 1945/07/09 DOA: 12/26/2020  PCP: Deland Pretty, MD  Admit date: 12/26/2020 Discharge date: 12/27/2020  Admitted From: home Disposition:  home  Recommendations for Outpatient Follow-up:  Follow up with PCP in 1-2 weeks Please obtain temporal artery ultrasound as an outpatient Patient started on aspirin and Plavix  Home Health: none Equipment/Devices: none  Discharge Condition: stable CODE STATUS: Full code Diet recommendation: heart healthy  HPI: Per admitting MD, Kendra Taylor is a 75 y.o. female with medical history significant of bronchiectasis, RA, LA, anxiety, GERD, LPR, OSA presenting with recent episodes of vision loss and aphasia. She states yesterday evening she had a 10-minute loss in her peripheral vision of her right eye in the lower field.  She also had a 15-minute episode of aphasia.  The seizure manifest itself when she was reading aloud and she was incoherent sounding and unable to get the correct words out.  All of her symptoms have resolved. She denies headache, recent cough.  She does note that she was vaccinated against zoster yesterday and can get some fevers with that.  She denies chest pain, shortness of breath, Donnell pain, constipation, diarrhea, nausea, vomiting.   Hospital Course / Discharge diagnoses: Principal problem TIA-patient admitted to the hospital following 2 episodes of focal neurologic deficit with visual field loss as well as expressive aphasia.  Neurology consulted and evaluated patient while hospitalized.  She underwent a regular stroke work-up with carotid Dopplers which were negative for significant stenosis.  A 2D echo was not done as patient refused and recently had a 2D echo as an outpatient which showed normal EF of 62%.  She has been having chronic migraines and there was a concern about this representing temporal arteritis.  CRP and sed rate were minimally  elevated.  A temporal artery ultrasound was ordered however it cannot be accommodated on hospital day 2 and patient asked to go home, not wanting to spend another night for this test.  This can certainly be done as an outpatient by the PCP.  Her LDL was elevated and she was placed on a statin.  She was placed on aspirin per neurology recommendations.  She was able to work with physical therapy, ambulatory, no deficits and will be discharged home in stable condition with outpatient follow-up  Active problems Fever-she had a low-grade temp in the ED of 100.5, she did have URI type symptoms, probably viral.  She was COVID-negative.  Fever resolved Rheumatoid arthritis-continue home medications Migraine headaches-continue home medications   Sepsis ruled out   Discharge Instructions   Allergies as of 12/27/2020       Reactions   Aspirin Other (See Comments)   Ringing in ears with large doses, large doses causes her to lose hearing        Medication List     TAKE these medications    alendronate 70 MG tablet Commonly known as: FOSAMAX TK 1 T PO ONCE A WEEK FOR 28 DAYS   aspirin 81 MG EC tablet Take 1 tablet (81 mg total) by mouth daily. Swallow whole. Start taking on: December 28, 2020   atorvastatin 80 MG tablet Commonly known as: LIPITOR Take 1 tablet (80 mg total) by mouth daily. Start taking on: December 28, 2020   fluticasone 50 MCG/ACT nasal spray Commonly known as: FLONASE SMARTSIG:1-2 Spray(s) Both Nares Daily   folic acid 1 MG tablet Commonly known as: FOLVITE Take 1 mg by mouth daily.  inFLIXimab 100 MG injection Commonly known as: REMICADE Inject into the vein every 8 (eight) weeks.   levocetirizine 5 MG tablet Commonly known as: XYZAL Take 5 mg by mouth daily as needed for allergies.   magnesium gluconate 500 MG tablet Commonly known as: MAGONATE Take 500 mg by mouth every other day.   methotrexate 2.5 MG tablet Commonly known as: RHEUMATREX TK 6 TS  PO WEEKLY   pantoprazole 20 MG tablet Commonly known as: PROTONIX Take 20 mg by mouth daily.   Pataday 0.1 % ophthalmic solution Generic drug: olopatadine 1 drop 2 (two) times daily.   SUMAtriptan 100 MG tablet Commonly known as: IMITREX Take 100 mg by mouth every 2 (two) hours as needed for migraine.   SYSTANE OP Place 2 drops into both eyes as needed (for dry eyes).   triamcinolone cream 0.1 % Commonly known as: KENALOG Apply 1 application topically as needed (for skin irritation).   vitamin B-12 1000 MCG tablet Commonly known as: CYANOCOBALAMIN Take 1,000 mcg by mouth daily.   Vitamin D 50 MCG (2000 UT) tablet Take 2,000 Units by mouth daily.        Follow-up Information     Garvin Fila, MD Follow up in 2 week(s).   Specialties: Neurology, Radiology Contact information: [place] [address] [phone]         Deland Pretty, MD Follow up in 1 week(s).   Specialty: Internal Medicine Contact information: [address] [place] [address] [phone]                 Consultations: Neurology   Procedures/Studies:  CT HEAD WO CONTRAST  Result Date: 12/26/2020 CLINICAL DATA:  TIA; 10-15 minute episode last night of losing peripheral vision in right eye EXAM: CT HEAD WITHOUT CONTRAST TECHNIQUE: Contiguous axial images were obtained from the base of the skull through the vertex without intravenous contrast. COMPARISON:  CT head 04/19/2014 FINDINGS: Brain: There is no evidence of acute intracranial hemorrhage, extra-axial fluid collection, or infarct. There is mild parenchymal volume loss with prominence of the ventricular system, unchanged. There is no mass lesion. There is no midline shift. Vascular: There is calcification of the bilateral cavernous ICAs. Skull: Normal. Negative for fracture or focal lesion. Sinuses/Orbits: The imaged paranasal sinuses are clear. The imaged globes and orbits are  unremarkable. Other: There is trace fluid in the right mastoid tip. IMPRESSION: No acute intracranial pathology. Electronically Signed   By: Valetta Mole M.D.   On: 12/26/2020 14:27   MR ANGIO HEAD WO CONTRAST  Result Date: 12/26/2020 CLINICAL DATA:  Transient ischemic attack (TIA); Neuro deficit, acute, stroke suspected EXAM: MRI HEAD WITHOUT CONTRAST MRA HEAD WITHOUT CONTRAST TECHNIQUE: Multiplanar, multi-echo pulse sequences of the brain and surrounding structures were acquired without intravenous contrast. Angiographic images of the Circle of Willis were acquired using MRA technique without intravenous contrast. COMPARISON:  No pertinent prior exam. FINDINGS: MRI HEAD FINDINGS Brain: No acute infarct, mass effect or extra-axial collection. No acute or chronic hemorrhage. Normal white matter signal. Generalized volume loss without a clear lobar predilection. The midline structures are normal. Vascular: Major flow voids are preserved. Skull and upper cervical spine: Normal calvarium and skull base. Visualized upper cervical spine and soft tissues are normal. Sinuses/Orbits:No paranasal sinus fluid levels or advanced mucosal thickening. Right mastoid fluid. Normal orbits. MRA HEAD FINDINGS POSTERIOR CIRCULATION: --Vertebral arteries: Normal --Inferior cerebellar arteries: Normal. --Basilar artery: Normal. --Superior cerebellar arteries: Normal. --Posterior cerebral  arteries: Normal. ANTERIOR CIRCULATION: --Intracranial internal carotid arteries: Normal. --Anterior cerebral arteries (ACA): Normal. --Middle cerebral arteries (MCA): Normal. ANATOMIC VARIANTS: None IMPRESSION: 1. No acute intracranial abnormality. 2. Generalized volume loss without a clear lobar predilection. 3. Normal intracranial MRA. Electronically Signed   By: Ulyses Jarred M.D.   On: 12/26/2020 21:30   MR BRAIN WO CONTRAST  Result Date: 12/26/2020 CLINICAL DATA:  Transient ischemic attack (TIA); Neuro deficit, acute, stroke suspected EXAM:  MRI HEAD WITHOUT CONTRAST MRA HEAD WITHOUT CONTRAST TECHNIQUE: Multiplanar, multi-echo pulse sequences of the brain and surrounding structures were acquired without intravenous contrast. Angiographic images of the Circle of Willis were acquired using MRA technique without intravenous contrast. COMPARISON:  No pertinent prior exam. FINDINGS: MRI HEAD FINDINGS Brain: No acute infarct, mass effect or extra-axial collection. No acute or chronic hemorrhage. Normal white matter signal. Generalized volume loss without a clear lobar predilection. The midline structures are normal. Vascular: Major flow voids are preserved. Skull and upper cervical spine: Normal calvarium and skull base. Visualized upper cervical spine and soft tissues are normal. Sinuses/Orbits:No paranasal sinus fluid levels or advanced mucosal thickening. Right mastoid fluid. Normal orbits. MRA HEAD FINDINGS POSTERIOR CIRCULATION: --Vertebral arteries: Normal --Inferior cerebellar arteries: Normal. --Basilar artery: Normal. --Superior cerebellar arteries: Normal. --Posterior cerebral arteries: Normal. ANTERIOR CIRCULATION: --Intracranial internal carotid arteries: Normal. --Anterior cerebral arteries (ACA): Normal. --Middle cerebral arteries (MCA): Normal. ANATOMIC VARIANTS: None IMPRESSION: 1. No acute intracranial abnormality. 2. Generalized volume loss without a clear lobar predilection. 3. Normal intracranial MRA. Electronically Signed   By: Ulyses Jarred M.D.   On: 12/26/2020 21:30   VAS US CAROTID  Result Date: 12/27/2020 Carotid Arterial Duplex Study Patient Name:  WILNA PENNIE [facility]  Date of Exam:   12/27/2020 Medical Rec #: [id number]               Accession #:    [id number] Date of Birth: 1946/03/18               Patient Gender: F Patient Age:   22 years Exam Location:  [facility] Procedure:      VAS US CAROTID Referring Phys: Alferd Patee [facility] --------------------------------------------------------------------------------   Indications:       CVA. Comparison Study:  no prior Performing Technologist: Archie Patten RVS  Examination Guidelines: A complete evaluation includes B-mode imaging, spectral Doppler, color Doppler, and power Doppler as needed of all accessible portions of each vessel. Bilateral testing is considered an integral part of a complete examination. Limited examinations for reoccurring indications may be performed as noted.  Right Carotid Findings: +----------+--------+--------+--------+------------------+--------+           PSV cm/sEDV cm/sStenosisPlaque DescriptionComments +----------+--------+--------+--------+------------------+--------+ CCA Prox  90      19              heterogenous               +----------+--------+--------+--------+------------------+--------+ CCA Distal88      18              heterogenous               +----------+--------+--------+--------+------------------+--------+ ICA Prox  95      24      1-39%   heterogenous               +----------+--------+--------+--------+------------------+--------+ ICA Distal74      23                                         +----------+--------+--------+--------+------------------+--------+  ECA       77      15                                         +----------+--------+--------+--------+------------------+--------+ +----------+--------+-------+--------+-------------------+           PSV cm/sEDV cmsDescribeArm Pressure (mmHG) +----------+--------+-------+--------+-------------------+ Subclavian69                                         +----------+--------+-------+--------+-------------------+ +---------+--------+--+--------+--+---------+ VertebralPSV cm/s55EDV cm/s12Antegrade +---------+--------+--+--------+--+---------+  Left Carotid Findings: +----------+--------+--------+--------+------------------+--------+           PSV cm/sEDV cm/sStenosisPlaque DescriptionComments  +----------+--------+--------+--------+------------------+--------+ CCA Prox  112     30              heterogenous               +----------+--------+--------+--------+------------------+--------+ CCA Distal97      27              heterogenous               +----------+--------+--------+--------+------------------+--------+ ICA Prox  75      30      1-39%   heterogenous               +----------+--------+--------+--------+------------------+--------+ ICA Distal138     48                                         +----------+--------+--------+--------+------------------+--------+ ECA       80      16                                         +----------+--------+--------+--------+------------------+--------+ +----------+--------+--------+--------+-------------------+           PSV cm/sEDV cm/sDescribeArm Pressure (mmHG) +----------+--------+--------+--------+-------------------+ [id number]                                          +----------+--------+--------+--------+-------------------+ +---------+--------+--+--------+--+---------+ VertebralPSV cm/s71EDV cm/s19Antegrade +---------+--------+--+--------+--+---------+   Summary: Right Carotid: Velocities in the right ICA are consistent with a 1-39% stenosis. Left Carotid: Velocities in the left ICA are consistent with a 1-39% stenosis. Vertebrals: Bilateral vertebral arteries demonstrate antegrade flow. *See table(s) above for measurements and observations.  Electronically signed by Antony Contras MD on 12/27/2020 at 11:24:00 AM.    Final      Subjective: - no chest pain, shortness of breath, no abdominal pain, nausea or vomiting.   Discharge Exam: BP (!) 155/90 (BP Location: Right Arm)   Pulse 75   Temp 98.1 F (36.7 C) (Oral)   Resp 18   Ht 5\' 2"  (1.575 m)   Wt 51.7 kg   SpO2 99%   BMI 20.85 kg/m   General: Pt is alert, awake, not in acute distress Cardiovascular: RRR, S1/S2 +, no rubs, no  gallops Respiratory: CTA bilaterally, no wheezing, no rhonchi Abdominal: Soft, NT, ND, bowel sounds + Extremities: no edema, no cyanosis    The results of significant diagnostics from this hospitalization (including imaging, microbiology, ancillary and laboratory) are  listed below for reference.     Microbiology: Recent Results (from the past 240 hour(s))  Urine Culture     Status: None   Collection Time: 12/26/20  1:58 PM   Specimen: Urine, Clean Catch  Result Value Ref Range Status   Specimen Description   Final    URINE, CLEAN CATCH Performed at [facility], [address], [place], [place] [address]    Special Requests   Final    Immunocompromised Performed at [facility], [address] [place], [place] [address]    Culture   Final    NO GROWTH Performed at [facility], [place] [address] [place], [place] [address]    Report Status 12/27/2020 FINAL  Final  Resp Panel by RT-PCR (Flu A&B, Covid) Nasopharyngeal Swab     Status: None   Collection Time: 12/26/20  2:22 PM   Specimen: Nasopharyngeal Swab; Nasopharyngeal(NP) swabs in vial transport medium  Result Value Ref Range Status   SARS Coronavirus 2 by RT PCR NEGATIVE NEGATIVE Final   Influenza A by PCR NEGATIVE NEGATIVE Final   Influenza B by PCR NEGATIVE NEGATIVE Final    Comment: Performed at [facility], [address] [place], [place] [address]     Labs: Basic Metabolic Panel: Recent Labs  Lab 12/26/20 1242  NA 137  K 3.9  CL 99  CO2 30  GLUCOSE 89  BUN 18  CREATININE 0.81  CALCIUM 9.3   Liver Function Tests: No results for input(s): AST, ALT, ALKPHOS, BILITOT, PROT, ALBUMIN in the last 168 hours. CBC: Recent Labs  Lab 12/26/20 1242  WBC 10.5  10.6*  NEUTROABS 8.0*  HGB 11.6*  11.5*  HCT 34.9*  34.7*  MCV 87.5  87.0  PLT 259  247   CBG: Recent Labs  Lab 12/26/20 1232  GLUCAP 82   Hgb A1c No results for input(s):  HGBA1C in the last 72 hours. Lipid Profile Recent Labs    12/27/20 0317  CHOL 182  HDL 55  LDLCALC 110*  TRIG 83  CHOLHDL 3.3   Thyroid function studies No results for input(s): TSH, T4TOTAL, T3FREE, THYROIDAB in the last 72 hours.  Invalid input(s): FREET3 Urinalysis    Component Value Date/Time   COLORURINE YELLOW 12/26/2020 [place] 12/26/2020 1354   LABSPEC 1.013 12/26/2020 1354   PHURINE 6.5 12/26/2020 1354   GLUCOSEU NEGATIVE 12/26/2020 1354   HGBUR LARGE (A) 12/26/2020 1354   BILIRUBINUR NEGATIVE 12/26/2020 1354   BILIRUBINUR negative 09/29/2017 1205   BILIRUBINUR neg 12/25/2013 1007   KETONESUR NEGATIVE 12/26/2020 1354   PROTEINUR 100 (A) 12/26/2020 1354   UROBILINOGEN 0.2 09/29/2017 1205   NITRITE NEGATIVE 12/26/2020 1354   LEUKOCYTESUR SMALL (A) 12/26/2020 1354    FURTHER DISCHARGE INSTRUCTIONS:   Get Medicines reviewed and adjusted: Please take all your medications with you for your next visit with your Primary MD   Laboratory/radiological data: Please request your Primary MD to go over all hospital tests and procedure/radiological results at the follow up, please ask your Primary MD to get all Hospital records sent to his/her office.   In some cases, they will be blood work, cultures and biopsy results pending at the time of your discharge. Please request that your primary care M.D. goes through all the records of your hospital data and follows up on these results.   Also Note the following: If you experience worsening of your admission symptoms, develop shortness of breath, life  threatening emergency, suicidal or homicidal thoughts you must seek medical attention immediately by calling 911 or calling your MD immediately  if symptoms less severe.   You must read complete instructions/literature along with all the possible adverse reactions/side effects for all the Medicines you take and that have been prescribed to you. Take any new  Medicines after you have completely understood and accpet all the possible adverse reactions/side effects.    Do not drive when taking Pain medications or sleeping medications (Benzodaizepines)   Do not take more than prescribed Pain, Sleep and Anxiety Medications. It is not advisable to combine anxiety,sleep and pain medications without talking with your primary care practitioner   Special Instructions: If you have smoked or chewed Tobacco  in the last 2 yrs please stop smoking, stop any regular Alcohol  and or any Recreational drug use.   Wear Seat belts while driving.   Please note: You were cared for by a hospitalist during your hospital stay. Once you are discharged, your primary care physician will handle any further medical issues. Please note that NO REFILLS for any discharge medications will be authorized once you are discharged, as it is imperative that you return to your primary care physician (or establish a relationship with a primary care physician if you do not have one) for your post hospital discharge needs so that they can reassess your need for medications and monitor your lab values.  Time coordinating discharge: 40 minutes  SIGNED:  Marzetta Board, MD, PhD 12/27/2020, 5:10 PM

## 2020-12-27 NOTE — Evaluation (Signed)
Physical Therapy Evaluation & Discharge Patient Details Name: Kendra Taylor MRN: [id number] DOB: 1945-06-06 Today's Date: 12/27/2020   History of Present Illness  Pt is a 75 y.o. female who presented 12/26/20 with temporary R eye vision loss and aphasia that resolved after ~10-15 min. Per chart, she was vaccinated against zoster the day before. Pt was found to have a slight fever upon arrival. Imaging of head was negative for any acute intracranial abnormalities. Pt being worked up for TIA. PMH: bronchiectasis, RA, LA, anxiety, GERD, LPR, OSA   Clinical Impression  Pt presents with condition above. Pt lives with her wife in a 1-level house with a level entry and is independent at baseline. Pt appears to be back at her baseline, denying any acute speech or vision issues (except caused by L eye swelling). Pt appears to be able to perform all functional mobility safely without LOB or assistance. All education completed and questions answered. PT will sign off at this time. Communicated findings with OT.    Follow Up Recommendations No PT follow up    Equipment Recommendations  None recommended by PT    Recommendations for Other Services       Precautions / Restrictions Precautions Precautions: None Restrictions Weight Bearing Restrictions: No      Mobility  Bed Mobility               General bed mobility comments: Pt sitting up in recliner upon arrival.    Transfers Overall transfer level: Independent Equipment used: None             General transfer comment: Pt able to transfer to stand safely in appropriate amount of time without LOB.  Ambulation/Gait Ambulation/Gait assistance: Supervision Gait Distance (Feet): 350 Feet Assistive device: None Gait Pattern/deviations: WFL(Within Functional Limits) Gait velocity: WFL Gait velocity interpretation: >4.37 ft/sec, indicative of normal walking speed (when cued to increase speed) General Gait Details: Pt  reports ambulating at a slightly slower pace than her usual, but was able to change speeds without LOB. Pt also able to hop and change head positions without LOB. No significant asymmetry noted with gait, supervision for safety, but pt capable of being independent.  Stairs            Wheelchair Mobility    Modified Rankin (Stroke Patients Only) Modified Rankin (Stroke Patients Only) Pre-Morbid Rankin Score: No symptoms Modified Rankin: No symptoms     Balance Overall balance assessment: Independent                                           Pertinent Vitals/Pain Pain Assessment: 0-10 Pain Score: 4  Pain Location: throat and L eye, neck, residual pain from shingles shot on Monday Pain Descriptors / Indicators: Tender;Discomfort Pain Intervention(s): Limited activity within patient's tolerance;Monitored during session;Repositioned    Home Living Family/patient expects to be discharged to:: Private residence Living Arrangements: Spouse/significant other Available Help at Discharge: Family;Available 24 hours/day Type of Home: House Home Access: Level entry     Home Layout: One level Home Equipment: Shower seat - built in;Grab bars - tub/shower Additional Comments: wife teaches water aerobics x4 hrs/week    Prior Function Level of Independence: Independent         Comments: Pt drives.     Hand Dominance   Dominant Hand: Left (ambi)    Extremity/Trunk Assessment   Upper Extremity Assessment  Upper Extremity Assessment: Overall WFL for tasks assessed (MMT scores of 4 to 4+ bil, coordination intact bil, sensation intact bil; pt reports generalized weakness from aging)    Lower Extremity Assessment Lower Extremity Assessment: Overall WFL for tasks assessed (MMT scores of 4+ to 5 bil, coordination intact bil, sensation intact bil; pt reports generalized weakness from aging)    Cervical / Trunk Assessment Cervical / Trunk Assessment: Normal   Communication   Communication: No difficulties  Cognition Arousal/Alertness: Awake/alert Behavior During Therapy: WFL for tasks assessed/performed Overall Cognitive Status: Within Functional Limits for tasks assessed                                 General Comments: Appeared to be very self-aware and appropriate in maintaining her safety.      General Comments General comments (skin integrity, edema, etc.): HR 109 bpm with gait; educated pt on light-moderate weight lifting and exercising to reduce further muscle atrophy with aging; denied falls in last 6 months    Exercises     Assessment/Plan    PT Assessment Patent does not need any further PT services  PT Problem List         PT Treatment Interventions      PT Goals (Current goals can be found in the Care Plan section)  Acute Rehab PT Goals Patient Stated Goal: to get stronger PT Goal Formulation: All assessment and education complete, DC therapy Time For Goal Achievement: 12/28/20 Potential to Achieve Goals: Good    Frequency     Barriers to discharge        Co-evaluation               AM-PAC PT "6 Clicks" Mobility  Outcome Measure Help needed turning from your back to your side while in a flat bed without using bedrails?: None Help needed moving from lying on your back to sitting on the side of a flat bed without using bedrails?: None Help needed moving to and from a bed to a chair (including a wheelchair)?: None Help needed standing up from a chair using your arms (e.g., wheelchair or bedside chair)?: None Help needed to walk in hospital room?: A Little Help needed climbing 3-5 steps with a railing? : A Little 6 Click Score: 22    End of Session   Activity Tolerance: Patient tolerated treatment well Patient left: in chair;with call bell/phone within reach;with chair alarm set Nurse Communication: Mobility status;Other (comment) (no need for chair alarm, pt independent) PT Visit  Diagnosis: Other symptoms and signs involving the nervous system (W80.321)    Time: 2248-2500 PT Time Calculation (min) (ACUTE ONLY): 22 min   Charges:   PT Evaluation $PT Eval Low Complexity: 1 Low          Moishe Spice, PT, DPT Acute Rehabilitation Services  Pager: [phone] Office: [place] 12/27/2020, 8:59 AM

## 2020-12-27 NOTE — Evaluation (Signed)
Occupational Therapy Evaluation Patient Details Name: Kendra Taylor MRN: [id number] DOB: [birth year] Today's Date: 12/27/2020    History of Present Illness Pt is a 75 y.o. female who presented 12/26/20 with temporary R eye vision loss and aphasia that resolved after ~10-15 min. Per chart, she was vaccinated against zoster the day before. Pt was found to have a slight fever upon arrival. Imaging of head was negative for any acute intracranial abnormalities. Pt being worked up for TIA. PMH: bronchiectasis, RA, LA, anxiety, GERD, LPR, OSA   Clinical Impression   Patient admitted for the diagnosis above.  PTA she is very independent and lives with her spouse, she is able to assist as needed.  Currently, she is at her baseline, and no acute OT needs have been identified.  Recommend home with intermittent supervision for a few days.      Follow Up Recommendations  No OT follow up    Equipment Recommendations  None recommended by OT    Recommendations for Other Services       Precautions / Restrictions Precautions Precautions: None Restrictions Weight Bearing Restrictions: No      Mobility Bed Mobility Overal bed mobility: Independent             General bed mobility comments: Pt sitting up in recliner upon arrival. Patient Response: Cooperative  Transfers Overall transfer level: Independent Equipment used: None             General transfer comment: Pt able to transfer to stand safely in appropriate amount of time without LOB.    Balance Overall balance assessment: No apparent balance deficits (not formally assessed)                                         ADL either performed or assessed with clinical judgement   ADL Overall ADL's : At baseline                                             Vision Baseline Vision/History: 1 Wears glasses Ability to See in Adequate Light: 0 Adequate Patient Visual Report: No change from  baseline Additional Comments: swollen L upper eye lid - happens from time to time     Perception     Praxis      Pertinent Vitals/Pain Pain Assessment: No/denies pain Pain Score: 4  Pain Location: throat and L eye, neck, residual pain from shingles shot on Monday Pain Descriptors / Indicators: Tender;Discomfort Pain Intervention(s): Monitored during session     Hand Dominance Left   Extremity/Trunk Assessment Upper Extremity Assessment Upper Extremity Assessment: Overall WFL for tasks assessed   Lower Extremity Assessment Lower Extremity Assessment: Defer to PT evaluation   Cervical / Trunk Assessment Cervical / Trunk Assessment: Normal   Communication Communication Communication: No difficulties   Cognition Arousal/Alertness: Awake/alert Behavior During Therapy: WFL for tasks assessed/performed Overall Cognitive Status: Within Functional Limits for tasks assessed                                 General Comments: VVS on RA   General Comments      Exercises     Shoulder Instructions      Home Living Family/patient expects  to be discharged to:: Private residence Living Arrangements: Spouse/significant other Available Help at Discharge: Family;Available 24 hours/day Type of Home: House Home Access: Level entry     Home Layout: One level     Bathroom Shower/Tub: Walk-in shower;Tub/shower unit   Bathroom Toilet: Handicapped height (has a standard height toilet too)     Home Equipment: Shower seat - built in;Grab bars - tub/shower   Additional Comments: wife teaches water aerobics x4 hrs/week      Prior Functioning/Environment Level of Independence: Independent        Comments: very active        OT Problem List: Impaired balance (sitting and/or standing)      OT Treatment/Interventions:      OT Goals(Current goals can be found in the care plan section) Acute Rehab OT Goals Patient Stated Goal: Return home and begin an exercise  program OT Goal Formulation: With patient Time For Goal Achievement: 12/27/20 Potential to Achieve Goals: Good  OT Frequency:     Barriers to D/C:  None noted          Co-evaluation              AM-PAC OT "6 Clicks" Daily Activity     Outcome Measure Help from another person eating meals?: None Help from another person taking care of personal grooming?: None Help from another person toileting, which includes using toliet, bedpan, or urinal?: None Help from another person bathing (including washing, rinsing, drying)?: None Help from another person to put on and taking off regular upper body clothing?: None Help from another person to put on and taking off regular lower body clothing?: None 6 Click Score: 24   End of Session Nurse Communication: Mobility status  Activity Tolerance: Patient tolerated treatment well Patient left: in bed;with call bell/phone within reach  OT Visit Diagnosis: Unsteadiness on feet (R26.81)                Time: 7829-5621 OT Time Calculation (min): 15 min Charges:  OT General Charges $OT Visit: 1 Visit OT Evaluation $OT Eval Moderate Complexity: 1 Mod  12/27/2020  RP, OTR/L  Acute Rehabilitation Services  Office:  [phone]   Metta Clines 12/27/2020, 10:42 AM

## 2020-12-27 NOTE — Progress Notes (Signed)
SLP Cancellation Note  Patient Details Name: Denene Alamillo MRN: [id number] DOB: 1945-12-06   Cancelled treatment:       Reason Eval/Treat Not Completed: SLP screened. Pt reporting speech/language function is back to baseline. No needs identified, will sign off.    [place], [place], [place] Acute Rehabilitation Services Office Number: [phone]  Acie Fredrickson 12/27/2020, 10:39 AM

## 2020-12-27 NOTE — Plan of Care (Signed)
  Problem: Education: Goal: Knowledge of General Education information will improve Description: Including pain rating scale, medication(s)/side effects and non-pharmacologic comfort measures Outcome: Adequate for Discharge   Problem: Health Behavior/Discharge Planning: Goal: Ability to manage health-related needs will improve Outcome: Adequate for Discharge   Problem: Clinical Measurements: Goal: Ability to maintain clinical measurements within normal limits will improve Outcome: Adequate for Discharge Goal: Will remain free from infection Outcome: Adequate for Discharge Goal: Diagnostic test results will improve Outcome: Adequate for Discharge Goal: Respiratory complications will improve Outcome: Adequate for Discharge Goal: Cardiovascular complication will be avoided Outcome: Adequate for Discharge   Problem: Elimination: Goal: Will not experience complications related to bowel motility Outcome: Adequate for Discharge Goal: Will not experience complications related to urinary retention Outcome: Adequate for Discharge   Problem: Pain Managment: Goal: General experience of comfort will improve Outcome: Adequate for Discharge   Problem: Intracerebral Hemorrhage Tissue Perfusion: Goal: Complications of Intracerebral Hemorrhage will be minimized Outcome: Adequate for Discharge   Problem: Ischemic Stroke/TIA Tissue Perfusion: Goal: Complications of ischemic stroke/TIA will be minimized Outcome: Adequate for Discharge   Problem: Nutrition: Goal: Risk of aspiration will decrease Outcome: Adequate for Discharge   Problem: Spontaneous Subarachnoid Hemorrhage Tissue Perfusion: Goal: Complications of Spontaneous Subarachnoid Hemorrhage will be minimized Outcome: Adequate for Discharge

## 2020-12-27 NOTE — Discharge Instructions (Signed)
Follow with Kendra Pretty, MD in 5-7 days  Please ask your primary care doctor for temporal artery ultrasound  Please get a complete blood count and chemistry panel checked by your Primary MD at your next visit, and again as instructed by your Primary MD. Please get your medications reviewed and adjusted by your Primary MD.  Please request your Primary MD to go over all Hospital Tests and Procedure/Radiological results at the follow up, please get all Hospital records sent to your Prim MD by signing hospital release before you go home.  In some cases, there will be blood work, cultures and biopsy results pending at the time of your discharge. Please request that your primary care M.D. goes through all the records of your hospital data and follows up on these results.  If you had Pneumonia of Lung problems at the Hospital: Please get a 2 view Chest X ray done in 6-8 weeks after hospital discharge or sooner if instructed by your Primary MD.  If you have Congestive Heart Failure: Please call your Cardiologist or Primary MD anytime you have any of the following symptoms:  1) 3 pound weight gain in 24 hours or 5 pounds in 1 week  2) shortness of breath, with or without a dry hacking cough  3) swelling in the hands, feet or stomach  4) if you have to sleep on extra pillows at night in order to breathe  Follow cardiac low salt diet and 1.5 lit/day fluid restriction.  If you have diabetes Accuchecks 4 times/day, Once in AM empty stomach and then before each meal. Log in all results and show them to your primary doctor at your next visit. If any glucose reading is under 80 or above 300 call your primary MD immediately.  If you have Seizure/Convulsions/Epilepsy: Please do not drive, operate heavy machinery, participate in activities at heights or participate in high speed sports until you have seen by Primary MD or a Neurologist and advised to do so again. Per [facility] statutes, patients  with seizures are not allowed to drive until they have been seizure-free for six months.  Use caution when using heavy equipment or power tools. Avoid working on ladders or at heights. Take showers instead of baths. Ensure the water temperature is not too high on the home water heater. Do not go swimming alone. Do not lock yourself in a room alone (i.e. bathroom). When caring for infants or small children, sit down when holding, feeding, or changing them to minimize risk of injury to the child in the event you have a seizure. Maintain good sleep hygiene. Avoid alcohol.   If you had Gastrointestinal Bleeding: Please ask your Primary MD to check a complete blood count within one week of discharge or at your next visit. Your endoscopic/colonoscopic biopsies that are pending at the time of discharge, will also need to followed by your Primary MD.  Get Medicines reviewed and adjusted. Please take all your medications with you for your next visit with your Primary MD  Please request your Primary MD to go over all hospital tests and procedure/radiological results at the follow up, please ask your Primary MD to get all Hospital records sent to his/her office.  If you experience worsening of your admission symptoms, develop shortness of breath, life threatening emergency, suicidal or homicidal thoughts you must seek medical attention immediately by calling 911 or calling your MD immediately  if symptoms less severe.  You must read complete instructions/literature along with all  the possible adverse reactions/side effects for all the Medicines you take and that have been prescribed to you. Take any new Medicines after you have completely understood and accpet all the possible adverse reactions/side effects.   Do not drive or operate heavy machinery when taking Pain medications.   Do not take more than prescribed Pain, Sleep and Anxiety Medications  Special Instructions: If you have smoked or chewed Tobacco   in the last 2 yrs please stop smoking, stop any regular Alcohol  and or any Recreational drug use.  Wear Seat belts while driving.  Please note You were cared for by a hospitalist during your hospital stay. If you have any questions about your discharge medications or the care you received while you were in the hospital after you are discharged, you can call the unit and asked to speak with the hospitalist on call if the hospitalist that took care of you is not available. Once you are discharged, your primary care physician will handle any further medical issues. Please note that NO REFILLS for any discharge medications will be authorized once you are discharged, as it is imperative that you return to your primary care physician (or establish a relationship with a primary care physician if you do not have one) for your aftercare needs so that they can reassess your need for medications and monitor your lab values.  You can reach the hospitalist office at phone [phone] or fax [phone]   If you do not have a primary care physician, you can call [phone] for a physician referral.  Activity: As tolerated with Full fall precautions use walker/cane & assistance as needed    Diet: heart healthy  Disposition Home

## 2020-12-27 NOTE — Progress Notes (Signed)
Carotid duplex has been completed.   Preliminary results in CV Proc.   Kendra Taylor 12/27/2020 10:52 AM

## 2020-12-27 NOTE — Progress Notes (Signed)
Chaplain responded to consult to create advanced directives. Chaplain provided education on the purpose of Advance Directives and the specific advance directives available at this facility-living will and health care power of attorney. Pt acknowledged the importance of completing this documentation and having care planning conversations with her loved ones. She plans to review the documents and stated that she will complete them on her own. Chaplain encouraged pt to notify her primary care physician and other medical providers if she completes advance directives. Chaplain also advised pt to bring documents with her if possible if she is hospitalized again in the future.  Chaplain also asked open ended questions to facilitate story telling and emotional expression. Pt shared her feelings related to not recognizing the signs of stroke earlier and expressed. Chaplain normalized anger, guilt, and grief that may accompany this realization.    Pt is hopeful that she will be discharged later today.  Please page as further needs arise.  Donald Prose. Elyn Peers, M.Div. [facility] Chaplain Pager [phone] Office [phone]      12/27/20 1408  Clinical Encounter Type  Visited With Patient  Visit Type Initial;Spiritual support;Psychological support  Spiritual Encounters  Spiritual Needs Emotional;Literature  Stress Factors  Patient Stress Factors Health changes  Advance Directives (For Healthcare)  Does Patient Have a Medical Advance Directive? No  Would patient like information on creating a medical advance directive? Yes (Inpatient - patient defers creating a medical advance directive at this time - Information given)

## 2020-12-27 NOTE — Progress Notes (Addendum)
STROKE TEAM PROGRESS NOTE   INTERVAL HISTORY  Hospitalization has been unremarkable. She is seen in the room alone today; no one at bedside.  Patient states she has history of chronic migraines which respond well to Imitrex.  She has a 10 to 15-minute episode of right IN lower quadrant vision loss which cleared subsequently she had episode of transient speech difficulties which also has resolved.  She does have rheumatoid arthritis.  She denies history of any jaw claudication, scalp tenderness or myalgias.  There is no history of episodes of blurred vision or loss of vision Vitals:   12/26/20 2130 12/26/20 2348 12/27/20 0323 12/27/20 1201  BP: (!) 158/91 (!) 156/81 (!) 152/90 (!) 152/82  Pulse: 78 81 91 79  Resp: '18 17 20 16  ' Temp: 99.1 F (37.3 C) 98.5 F (36.9 C) 98.6 F (37 C) 98.3 F (36.8 C)  TempSrc: Oral Oral Oral Oral  SpO2: 100% 97% 96% 100%  Weight:      Height:       CBC:  Recent Labs  Lab 12/26/20 1242  WBC 10.5  10.6*  NEUTROABS 8.0*  HGB 11.6*  11.5*  HCT 34.9*  34.7*  MCV 87.5  87.0  PLT 259  202   Basic Metabolic Panel:  Recent Labs  Lab 12/26/20 1242  NA 137  K 3.9  CL 99  CO2 30  GLUCOSE 89  BUN 18  CREATININE 0.81  CALCIUM 9.3    Lipid Panel:  Recent Labs  Lab 12/27/20 0317  CHOL 182  TRIG 83  HDL 55  CHOLHDL 3.3  VLDL 17  LDLCALC 110*    HgbA1c: No results for input(s): HGBA1C in the last 168 hours. Urine Drug Screen: No results for input(s): LABOPIA, COCAINSCRNUR, LABBENZ, AMPHETMU, THCU, LABBARB in the last 168 hours.  Alcohol Level No results for input(s): ETH in the last 168 hours.  IMAGING past 24 hours CT HEAD WO CONTRAST  Result Date: 12/26/2020 CLINICAL DATA:  TIA; 10-15 minute episode last night of losing peripheral vision in right eye EXAM: CT HEAD WITHOUT CONTRAST TECHNIQUE: Contiguous axial images were obtained from the base of the skull through the vertex without intravenous contrast. COMPARISON:  CT head 04/19/2014  FINDINGS: Brain: There is no evidence of acute intracranial hemorrhage, extra-axial fluid collection, or infarct. There is mild parenchymal volume loss with prominence of the ventricular system, unchanged. There is no mass lesion. There is no midline shift. Vascular: There is calcification of the bilateral cavernous ICAs. Skull: Normal. Negative for fracture or focal lesion. Sinuses/Orbits: The imaged paranasal sinuses are clear. The imaged globes and orbits are unremarkable. Other: There is trace fluid in the right mastoid tip. IMPRESSION: No acute intracranial pathology. Electronically Signed   By: Valetta Mole M.D.   On: 12/26/2020 14:27   MR ANGIO HEAD WO CONTRAST  Result Date: 12/26/2020 CLINICAL DATA:  Transient ischemic attack (TIA); Neuro deficit, acute, stroke suspected EXAM: MRI HEAD WITHOUT CONTRAST MRA HEAD WITHOUT CONTRAST TECHNIQUE: Multiplanar, multi-echo pulse sequences of the brain and surrounding structures were acquired without intravenous contrast. Angiographic images of the Circle of Willis were acquired using MRA technique without intravenous contrast. COMPARISON:  No pertinent prior exam. FINDINGS: MRI HEAD FINDINGS Brain: No acute infarct, mass effect or extra-axial collection. No acute or chronic hemorrhage. Normal white matter signal. Generalized volume loss without a clear lobar predilection. The midline structures are normal. Vascular: Major flow voids are preserved. Skull and upper cervical spine: Normal calvarium and skull base.  Visualized upper cervical spine and soft tissues are normal. Sinuses/Orbits:No paranasal sinus fluid levels or advanced mucosal thickening. Right mastoid fluid. Normal orbits. MRA HEAD FINDINGS POSTERIOR CIRCULATION: --Vertebral arteries: Normal --Inferior cerebellar arteries: Normal. --Basilar artery: Normal. --Superior cerebellar arteries: Normal. --Posterior cerebral arteries: Normal. ANTERIOR CIRCULATION: --Intracranial internal carotid arteries: Normal.  --Anterior cerebral arteries (ACA): Normal. --Middle cerebral arteries (MCA): Normal. ANATOMIC VARIANTS: None IMPRESSION: 1. No acute intracranial abnormality. 2. Generalized volume loss without a clear lobar predilection. 3. Normal intracranial MRA. Electronically Signed   By: Ulyses Jarred M.D.   On: 12/26/2020 21:30   MR BRAIN WO CONTRAST  Result Date: 12/26/2020 CLINICAL DATA:  Transient ischemic attack (TIA); Neuro deficit, acute, stroke suspected EXAM: MRI HEAD WITHOUT CONTRAST MRA HEAD WITHOUT CONTRAST TECHNIQUE: Multiplanar, multi-echo pulse sequences of the brain and surrounding structures were acquired without intravenous contrast. Angiographic images of the Circle of Willis were acquired using MRA technique without intravenous contrast. COMPARISON:  No pertinent prior exam. FINDINGS: MRI HEAD FINDINGS Brain: No acute infarct, mass effect or extra-axial collection. No acute or chronic hemorrhage. Normal white matter signal. Generalized volume loss without a clear lobar predilection. The midline structures are normal. Vascular: Major flow voids are preserved. Skull and upper cervical spine: Normal calvarium and skull base. Visualized upper cervical spine and soft tissues are normal. Sinuses/Orbits:No paranasal sinus fluid levels or advanced mucosal thickening. Right mastoid fluid. Normal orbits. MRA HEAD FINDINGS POSTERIOR CIRCULATION: --Vertebral arteries: Normal --Inferior cerebellar arteries: Normal. --Basilar artery: Normal. --Superior cerebellar arteries: Normal. --Posterior cerebral arteries: Normal. ANTERIOR CIRCULATION: --Intracranial internal carotid arteries: Normal. --Anterior cerebral arteries (ACA): Normal. --Middle cerebral arteries (MCA): Normal. ANATOMIC VARIANTS: None IMPRESSION: 1. No acute intracranial abnormality. 2. Generalized volume loss without a clear lobar predilection. 3. Normal intracranial MRA. Electronically Signed   By: Ulyses Jarred M.D.   On: 12/26/2020 21:30   VAS US  CAROTID  Result Date: 12/27/2020 Carotid Arterial Duplex Study Patient Name:  Kendra Taylor [facility]  Date of Exam:   12/27/2020 Medical Rec #: [id number]               Accession #:    [id number] Date of Birth: [birth year]               Patient Gender: F Patient Age:   61 years Exam Location:  [facility] Procedure:      VAS US CAROTID Referring Phys: [facility] --------------------------------------------------------------------------------  Indications:       CVA. Comparison Study:  no prior Performing Technologist: Archie Patten RVS  Examination Guidelines: A complete evaluation includes B-mode imaging, spectral Doppler, color Doppler, and power Doppler as needed of all accessible portions of each vessel. Bilateral testing is considered an integral part of a complete examination. Limited examinations for reoccurring indications may be performed as noted.  Right Carotid Findings: +----------+--------+--------+--------+------------------+--------+           PSV cm/sEDV cm/sStenosisPlaque DescriptionComments +----------+--------+--------+--------+------------------+--------+ CCA Prox  90      19              heterogenous               +----------+--------+--------+--------+------------------+--------+ CCA Distal88      18              heterogenous               +----------+--------+--------+--------+------------------+--------+ ICA Prox  95      24      1-39%   heterogenous               +----------+--------+--------+--------+------------------+--------+  ICA Distal74      23                                         +----------+--------+--------+--------+------------------+--------+ ECA       77      15                                         +----------+--------+--------+--------+------------------+--------+ +----------+--------+-------+--------+-------------------+           PSV cm/sEDV cmsDescribeArm Pressure (mmHG)  +----------+--------+-------+--------+-------------------+ Subclavian69                                         +----------+--------+-------+--------+-------------------+ +---------+--------+--+--------+--+---------+ VertebralPSV cm/s55EDV cm/s12Antegrade +---------+--------+--+--------+--+---------+  Left Carotid Findings: +----------+--------+--------+--------+------------------+--------+           PSV cm/sEDV cm/sStenosisPlaque DescriptionComments +----------+--------+--------+--------+------------------+--------+ CCA Prox  112     30              heterogenous               +----------+--------+--------+--------+------------------+--------+ CCA Distal97      27              heterogenous               +----------+--------+--------+--------+------------------+--------+ ICA Prox  75      30      1-39%   heterogenous               +----------+--------+--------+--------+------------------+--------+ ICA Distal138     48                                         +----------+--------+--------+--------+------------------+--------+ ECA       80      16                                         +----------+--------+--------+--------+------------------+--------+ +----------+--------+--------+--------+-------------------+           PSV cm/sEDV cm/sDescribeArm Pressure (mmHG) +----------+--------+--------+--------+-------------------+ [id number]                                          +----------+--------+--------+--------+-------------------+ +---------+--------+--+--------+--+---------+ VertebralPSV cm/s71EDV cm/s19Antegrade +---------+--------+--+--------+--+---------+   Summary: Right Carotid: Velocities in the right ICA are consistent with a 1-39% stenosis. Left Carotid: Velocities in the left ICA are consistent with a 1-39% stenosis. Vertebrals: Bilateral vertebral arteries demonstrate antegrade flow. *See table(s) above for measurements and  observations.  Electronically signed by Antony Contras MD on 12/27/2020 at 11:24:00 AM.    Final     PHYSICAL EXAM  Physical Exam  Constitutional: Frail elderly Caucasian lady not in distress Psych: Affect appropriate to situation Eyes: No scleral injection HENT: No OP obstrucion MSK: no joint deformities.  Cardiovascular: Normal rate and regular rhythm.  Respiratory: Effort normal, non-labored breathing GI: Soft.  No distension. There is no tenderness.  Skin: WDI No scalp tenderness.  Superficial temporal arteries are well felt without tenderness Neuro: Mental  Status: Patient is awake, alert, oriented to person, place, month, year, and situation.  Patient is able to give a clear and coherent history.  No signs of aphasia or neglect   Cranial Nerves: II: Visual Fields are full.  Fundi not visualized.  Pupils are equal, round, and reactive to light.  She has mildly edematous left upper eyelid. No visual disturbances.  III,IV, VI: EOMI without ptosis or diploplia.  V: Facial sensation is symmetric to temperature VII: Facial movement is symmetric resting and smiling VIII: Hearing is intact to voice X: Palate elevates symmetrically XI: Shoulder shrug is symmetric. XII: Tongue protrudes midline without atrophy or fasciculations.   Motor: Tone is normal. Bulk is normal. 5/5 strength was present in all four extremities.   Sensory: Sensation is symmetric to light touch and temperature in the arms and legs. No extinction to DSS present.   Deep Tendon Reflexes: 2+ and symmetric in the biceps and patellae.   Plantars: Toes are downgoing bilaterally.   Cerebellar: FNF and HKS are intact bilaterally         ASSESSMENT/PLAN Ms. Staley Budzinski is a 75 y.o. female with history of  bronchietasis, Rheumatoid arthritis, GERD, headache, sleep apnea who was on her computer last night when she had sudden onset R inferior temporal quadrant vision loss. This lasted about 15 mins. This  resolved and she went to read her favorite book and was having trouble with words. She was trying to say a word and something else would come out. Her speech was incoherent and not making much sense. She knew what she wanted to say. This lasted about 15 mins and then resolved; there was a gap of 10 minutes from resolution of her vision loss to the start of her difficulty with words.  She reached out to her PCP on insistance of her friends on day of admission and was advised to come to the ED. MRI brain was unremarkable. Since her admission she has not had further episodes. MRA head was also unremarkable. Carotid ultrasound shows mild stenosis at bilateral carotid arteries with 1-39% stenoses, and antegrade flow.   She does have a history of migraine headaches; they typically start around both her eyes and and migrate to bilateral temporal areas. She does report scalp tenderness, but denies pain at jaw with eating. She is on sumatriptan, and reports that her headaches are variable. She also has history of rheumatoid arthritis, on remicade.   Unclear if her presentation represents an atypical migraine. Neuroimaging is unremarkable. Would also consider temporal arteritis in differential. Will obtain ESR< CRP, and temporal artery ultrasound and continue to follow up.    75 year old lady with transient right eye lower quadrant vision loss as well as transient expressive speech difficulties of possibilities include atypical migraine versus temporal arteritis TIA is less likely.  Possible temporal arteritis versus atypical migraine.  Doubt stroke or TIA   CT head No acute abnormality   MRI: no acute intracranial abnormality.  MRA   head: unremarkable.  Carotid Doppler:   Right Carotid: Velocities in the right ICA are consistent with a 1-39%  stenosis.   Left Carotid: Velocities in the left ICA are consistent with a 1-39%  stenosis.   Vertebrals: Bilateral vertebral arteries demonstrate antegrade flow.    *See table(s) above for measurements and observations.      2D Echo : pending   LDL 110 HgbA1c No results found for requested labs within last 26280 hours. VTE prophylaxis -  lovenox  Diet   Diet regular Room service appropriate? Yes; Fluid consistency: Thin   No antithrombotic prior to admission, now on aspirin 81 mg daily.  Therapy recommendations:  no OT or PT follow up Disposition:  home    Hyperlipidemia Home meds:   none,  lipitor started in hospital LDL 110, goal < 70 High intensity statin lipitor 30m started  Continue statin at discharge   HgbA1c No results found for requested labs within last 26280 hours., goal < 7.0. lab testing ordered  CBGs Recent Labs    12/26/20 1232  GLUCAP 82    SSI  Other Stroke Risk Factors  Advanced Age >/= 643  Migraines  Obstructive sleep apnea  Other Active Problems  Rheumatoid arthritis: on remicade and methotrexate GERD: on home PPI.  Hospital day # 0 I have personally obtained history,examined this patient, reviewed notes, independently viewed imaging studies, participated in medical decision making and plan of care.ROS completed by me personally and pertinent positives fully documented  I have made any additions or clarifications directly to the above note. Agree with note above.  Patient presented with transient right lower quadrant vision loss as well as transient expressive speech difficulties of unclear etiology.  Possibilities include temporal arteritis versus atypical migraine and TIA less likely.  Recommend check ESR, C-reactive protein, temporal artery ultrasound.  Aspirin for stroke prevention and continue ongoing stroke work-up.  Greater than 50% time during this 35-minute visit was spent on counseling and coordination of care with vision loss and speech difficulties answered questions.  Discussed with Dr. GPeyton Bottoms MD Medical Director MShady ShoresPager: [phone]/31/2022 3:01  PM  o contact Stroke Continuity provider, please refer to [email or web address] After hours, contact General Neurology

## 2020-12-27 NOTE — Care Management Obs Status (Signed)
[place] NOTIFICATION   Patient Details  Name: Kendra Taylor MRN: [id number] Date of Birth: 1946/03/27   Medicare Observation Status Notification Given:  Yes    Pollie Friar, RN 12/27/2020, 4:50 PM

## 2020-12-28 NOTE — TOC Transition Note (Signed)
Transition of Care [facility]) - CM/SW Discharge Note   Patient Details  Name: Kendra Taylor MRN: [id number] Date of Birth: [birth year]  [facility] CM/SW Contact:  Pollie Friar, RN Phone Number: 12/28/2020, 8:54 AM   Clinical Narrative:    Patient lives with her spouse at home. She denies any issues with her home medications or transportation. Pt will have supervision at home. No f/u per PT/OT and no DME needs.  Pt has transport home today.   Final next level of care: Home/Self Care Barriers to Discharge: No Barriers Identified   Patient Goals and CMS Choice        Discharge Placement                       Discharge Plan and Services                                     Social Determinants of Health (SDOH) Interventions     Readmission Risk Interventions No flowsheet data found.

## 2021-01-04 DIAGNOSIS — R030 Elevated blood-pressure reading, without diagnosis of hypertension: Secondary | ICD-10-CM | POA: Diagnosis not present

## 2021-01-04 DIAGNOSIS — Z8673 Personal history of transient ischemic attack (TIA), and cerebral infarction without residual deficits: Secondary | ICD-10-CM | POA: Diagnosis not present

## 2021-01-04 DIAGNOSIS — G43909 Migraine, unspecified, not intractable, without status migrainosus: Secondary | ICD-10-CM | POA: Diagnosis not present

## 2021-01-04 DIAGNOSIS — R634 Abnormal weight loss: Secondary | ICD-10-CM | POA: Diagnosis not present

## 2021-01-08 ENCOUNTER — Telehealth: Payer: Self-pay | Admitting: Neurology

## 2021-01-08 ENCOUNTER — Ambulatory Visit (INDEPENDENT_AMBULATORY_CARE_PROVIDER_SITE_OTHER): Payer: MEDICARE | Admitting: Neurology

## 2021-01-08 ENCOUNTER — Encounter: Payer: Self-pay | Admitting: Neurology

## 2021-01-08 VITALS — BP 135/68 | HR 92 | Ht 62.0 in | Wt 116.0 lb

## 2021-01-08 DIAGNOSIS — G43709 Chronic migraine without aura, not intractable, without status migrainosus: Secondary | ICD-10-CM | POA: Diagnosis not present

## 2021-01-08 DIAGNOSIS — H539 Unspecified visual disturbance: Secondary | ICD-10-CM | POA: Diagnosis not present

## 2021-01-08 MED ORDER — TIZANIDINE HCL 4 MG PO TABS: 4.0000 mg | ORAL_TABLET | Freq: Four times a day (QID) | ORAL | 6 refills | Status: DC | PRN

## 2021-01-08 MED ORDER — NURTEC 75 MG PO TBDP: ORAL_TABLET | ORAL | 11 refills | Status: DC

## 2021-01-08 MED ORDER — ONDANSETRON 4 MG PO TBDP: 4.0000 mg | ORAL_TABLET | Freq: Three times a day (TID) | ORAL | 6 refills | Status: AC | PRN

## 2021-01-08 NOTE — Patient Instructions (Signed)
Meds ordered this encounter  Medications   Rimegepant Sulfate (NURTEC) 75 MG TBDP    Sig: Take 1 tab at onset of migraine.  May repeat in 2 hrs, if needed.  Max dose: 2 tabs/day. This is a 30 day prescription.    Dispense:  12 tablet    Refill:  11   ondansetron (ZOFRAN ODT) 4 MG disintegrating tablet    Sig: Take 1 tablet (4 mg total) by mouth every 8 (eight) hours as needed for nausea or vomiting.    Dispense:  20 tablet    Refill:  6   tiZANidine (ZANAFLEX) 4 MG tablet    Sig: Take 1 tablet (4 mg total) by mouth every 6 (six) hours as needed.    Dispense:  30 tablet    Refill:  6     You may combine Nurtec, Zofran, aleve, and even Tizanidine.

## 2021-01-08 NOTE — Progress Notes (Signed)
Chief Complaint  Patient presents with   New Patient (Initial Visit)    RM 14, with wife, TIA fu, states she is doing well, concerned about frequent migraines, 1-2 months, headaches, pcp stopped her triptan.      ASSESSMENT AND PLAN  Melonee Gerstel is a 75 y.o. female    Chronic migraine headaches Recently responded very well to Imitrex 100 mg as needed, Not good candidate for triptan due to TIA episode Will try Nurtec 75 mg as needed, may combine with Zofran, Aleve, tizanidine for severe prolonged headaches.  TIA on December 25, 2020  Transient right visual field loss, and expressive aphasia  Abnormal MRI of the brain, MRA of the brain, ultrasound of carotid artery showed no large vessel disease  Echocardiogram showed no significant abnormality  On aspirin 81 mg daily  Vascular risk factor of hyperlipidemia on Lipitor   DIAGNOSTIC DATA (LABS, IMAGING, TESTING) - I reviewed patient records, labs, notes, testing and imaging myself where available.   MEDICAL HISTORY:  Leeya Rusconi, is a 75 year old female, seen in request by her primary care physician Dr. Deland Pretty, to follow-up for hospital discharge, she is accompanied by her partner at today's visit on January 08, 2021  I reviewed and summarized the referring note.  Past medical history Rheumatoid arthritis, taking methotrexate 2.5 mg 6 tablets weekly, Remicade, Osteoporosis, Hyperlipidemia Chronic migraine headaches  I saw her in 2015 for frequent severe migraine headaches, she was put on Imitrex as needed, which has been very effective, it only happens every few months  She was admitted to hospital following an episode on December 25, 2020, at nighttime, she had a sudden onset loss of the right visual field, when she was talking, was noted to be gibberish, lasting for 20 minutes, denies headache with it, her previous migraine does not have aura,  When she talk with her friends about her symptoms  next day December 26, 2020, worry about the stroke, she to the ER, leading to hospital admission, extensive evaluations,  Personally reviewed MRI of the brain December 26, 2020 that was normal MRI of the brain showed no large vessel disease Ultrasound of carotid artery less than 39% stenosis bilateral internal carotid artery, antegrade flow of bilateral vertebral system Echocardiogram ejection fraction 60%, mild aortic valve leaflet calcification  Laboratory evaluation showed normal ESR C-reactive protein, A1c of 5.3, LDL of 110  She was discharged with aspirin 81 mg and Lipitor 80 mg daily  She had 1 episode of migraine recently, was given a trial of Ubrelvy 50 mg, she had significant nausea right after she took medications, headache last about 18 hours, multiple nausea vomiting episode   PHYSICAL EXAM:   Vitals:   01/08/21 1053  BP: 135/68  Pulse: 92  Weight: 116 lb (52.6 kg)  Height: '5\' 2"'  (1.575 m)   Not recorded     Body mass index is 21.22 kg/m.  PHYSICAL EXAMNIATION:  Gen: NAD, conversant, well nourised, well groomed                     Cardiovascular: Regular rate rhythm, no peripheral edema, warm, nontender. Eyes: Conjunctivae clear without exudates or hemorrhage Neck: Supple, no carotid bruits. Pulmonary: Clear to auscultation bilaterally   NEUROLOGICAL EXAM:  MENTAL STATUS: Speech:    Speech is normal; fluent and spontaneous with normal comprehension.  Cognition:     Orientation to time, place and person     Normal recent and remote memory  Normal Attention span and concentration     Normal Language, naming, repeating,spontaneous speech     Fund of knowledge   CRANIAL NERVES: CN II: Visual fields are full to confrontation. Pupils are round equal and briskly reactive to light. CN III, IV, VI: extraocular movement are normal. No ptosis. CN V: Facial sensation is intact to light touch CN VII: Face is symmetric with normal eye closure  CN VIII: Hearing is  normal to causal conversation. CN IX, X: Phonation is normal. CN XI: Head turning and shoulder shrug are intact  MOTOR: There is no pronator drift of out-stretched arms. Muscle bulk and tone are normal. Muscle strength is normal.  REFLEXES: Reflexes are 2+ and symmetric at the biceps, triceps, knees, and ankles. Plantar responses are flexor.  SENSORY: Intact to light touch, pinprick and vibratory sensation are intact in fingers and toes.  COORDINATION: There is no trunk or limb dysmetria noted.  GAIT/STANCE: Posture is normal. Gait is steady with normal steps, base, arm swing, and turning. Heel and toe walking are normal. Tandem gait is normal.  Romberg is absent.  REVIEW OF SYSTEMS:  Full 14 system review of systems performed and notable only for as above All other review of systems were negative.   ALLERGIES: Allergies  Allergen Reactions   Aspirin Other (See Comments)    Ringing in ears with large doses, large doses causes her to lose hearing    HOME MEDICATIONS: Current Outpatient Medications  Medication Sig Dispense Refill   alendronate (FOSAMAX) 70 MG tablet TK 1 T PO ONCE A WEEK FOR 28 DAYS  3   aspirin EC 81 MG EC tablet Take 1 tablet (81 mg total) by mouth daily. Swallow whole. 30 tablet 11   atorvastatin (LIPITOR) 80 MG tablet Take 1 tablet (80 mg total) by mouth daily. 30 tablet 1   Cholecalciferol (VITAMIN D) 2000 units tablet Take 2,000 Units by mouth daily.      fluticasone (FLONASE) 50 MCG/ACT nasal spray SMARTSIG:1-2 Spray(s) Both Nares Daily     folic acid (FOLVITE) 1 MG tablet Take 1 mg by mouth daily.     inFLIXimab (REMICADE) 100 MG injection Inject into the vein every 8 (eight) weeks.      levocetirizine (XYZAL) 5 MG tablet Take 5 mg by mouth daily as needed for allergies.      magnesium gluconate (MAGONATE) 500 MG tablet Take 500 mg by mouth every other day.      methotrexate (RHEUMATREX) 2.5 MG tablet TK 6 TS PO WEEKLY     olopatadine (PATANOL) 0.1  % ophthalmic solution 1 drop 2 (two) times daily.     pantoprazole (PROTONIX) 20 MG tablet Take 20 mg by mouth daily.     Polyethyl Glycol-Propyl Glycol (SYSTANE OP) Place 2 drops into both eyes as needed (for dry eyes).     triamcinolone cream (KENALOG) 0.1 % Apply 1 application topically as needed (for skin irritation).     vitamin B-12 (CYANOCOBALAMIN) 1000 MCG tablet Take 1,000 mcg by mouth daily.     No current facility-administered medications for this visit.    PAST MEDICAL HISTORY: Past Medical History:  Diagnosis Date   Anemia    Anxiety    Arthritis    Atypical mole 01/08/2013   right inner forearm   Basal cell carcinoma 03/12/2017   right concha exc mohs   Depression    Dyspnea    GERD (gastroesophageal reflux disease)    HA (headache)    Migraine  Sleep apnea    cpap    PAST SURGICAL HISTORY: Past Surgical History:  Procedure Laterality Date   ABDOMINAL HYSTERECTOMY     BUNIONECTOMY     both   FINGER ARTHROPLASTY Right 07/30/2012   Procedure: RIGHT INDEX FINGER, MIDDLE FINGER, RING FINGER, SMALL FINGER MCP ARTHROPLASTY WITH REALIGNMENT/REPAIR OF EXTENSOR TENDON;  Surgeon: Roseanne Kaufman, MD;  Location: [facility];  Service: Orthopedics;  Laterality: Right;   FINGER SURGERY     HAMMER TOE SURGERY Left 03/03/2014   Procedure: SECOND THROUGH FIFTH HAMMERTOE CORRECTION;  Surgeon: Wylene Simmer, MD;  Location: [facility];  Service: Orthopedics;  Laterality: Left;   JOINT REPLACEMENT     METATARSAL HEAD EXCISION Left 03/03/2014   Procedure: LEFT SECOND THROUGH FIFTH METATARSAL HEAD EXCISION;  Surgeon: Wylene Simmer, MD;  Location: [facility];  Service: Orthopedics;  Laterality: Left;   OOPHORECTOMY     VIDEO BRONCHOSCOPY Bilateral 12/18/2016   Procedure: VIDEO BRONCHOSCOPY WITH FLUORO;  Surgeon: Tanda Rockers, MD;  Location: [facility];  Service: Cardiopulmonary;  Laterality: Bilateral;   VIDEO BRONCHOSCOPY WITH ENDOBRONCHIAL NAVIGATION Left  02/26/2017   Procedure: VIDEO BRONCHOSCOPY WITH ENDOBRONCHIAL NAVIGATION;  Surgeon: Collene Gobble, MD;  Location: [facility];  Service: Thoracic;  Laterality: Left;    FAMILY HISTORY: Family History  Problem Relation Age of Onset   Dementia Mother    Sleep apnea Mother    Hyperthyroidism Mother    Dementia Father    Sleep apnea Father     SOCIAL HISTORY: Social History   Socioeconomic History   Marital status: Married    Spouse name: Remo Lipps   Number of children: 0   Years of education: masters   Highest education level: Not on file  Occupational History    Comment: Retired  Tobacco Use   Smoking status: Never   Smokeless tobacco: Never  Vaping Use   Vaping Use: Not on file  Substance and Sexual Activity   Alcohol use: No    Alcohol/week: 0.0 standard drinks   Drug use: No   Sexual activity: Not on file  Other Topics Concern   Not on file  Social History Narrative   Patient lives at home with her spouse (Paltrineri Remo Lipps.) Patient has two cats.   Retired.   Arboriculturist.   Right handed.   Caffeine One cup of coffee daily.   Social Determinants of Health   Financial Resource Strain: Not on file  Food Insecurity: Not on file  Transportation Needs: Not on file  Physical Activity: Not on file  Stress: Not on file  Social Connections: Not on file  Intimate Partner Violence: Not on file      Marcial Pacas, M.D. Ph.D.  [facility] [address] [place], [place] [address] Ph: [phone] Fax: [phone]  CC:  Deland Pretty, MD [place],   [address]  Deland Pretty, MD

## 2021-01-08 NOTE — Telephone Encounter (Signed)
It is Ok for her to do workout.

## 2021-01-08 NOTE — Telephone Encounter (Signed)
Patient would like to know if a mild work out is safe for her to do, she forgot to ask during her appointment. Phone call or mychart message response okay

## 2021-01-09 NOTE — Telephone Encounter (Signed)
I spoke to the patient and provided her with Dr. Rhea Belton response.

## 2021-01-09 NOTE — Telephone Encounter (Signed)
I submitted the PA on CMM, Key: [id number]. Awaiting a response from [employer].

## 2021-01-09 NOTE — Telephone Encounter (Signed)
Also, patient states [facility]ur[facility] needs a PA w/ her insurance company. States she has failed sumatriptan and Ubrelvy in the past. She is not a good candidate for triptans due to history of TIA.

## 2021-01-16 DIAGNOSIS — Z23 Encounter for immunization: Secondary | ICD-10-CM | POA: Diagnosis not present

## 2021-01-16 DIAGNOSIS — Z8673 Personal history of transient ischemic attack (TIA), and cerebral infarction without residual deficits: Secondary | ICD-10-CM | POA: Diagnosis not present

## 2021-01-16 DIAGNOSIS — G43909 Migraine, unspecified, not intractable, without status migrainosus: Secondary | ICD-10-CM | POA: Diagnosis not present

## 2021-01-16 NOTE — Telephone Encounter (Signed)
Approved.  Request Reference Number: [id number]. NURTEC TAB 75MG  ODT is approved through 04/28/2021.

## 2021-01-18 ENCOUNTER — Encounter: Payer: Self-pay | Admitting: Physician Assistant

## 2021-01-18 ENCOUNTER — Ambulatory Visit (INDEPENDENT_AMBULATORY_CARE_PROVIDER_SITE_OTHER): Payer: MEDICARE | Admitting: Physician Assistant

## 2021-01-18 ENCOUNTER — Other Ambulatory Visit: Payer: Self-pay

## 2021-01-18 DIAGNOSIS — D2330 Other benign neoplasm of skin of unspecified part of face: Secondary | ICD-10-CM

## 2021-01-18 DIAGNOSIS — Z85828 Personal history of other malignant neoplasm of skin: Secondary | ICD-10-CM

## 2021-01-18 DIAGNOSIS — D2339 Other benign neoplasm of skin of other parts of face: Secondary | ICD-10-CM | POA: Diagnosis not present

## 2021-01-18 DIAGNOSIS — Z1283 Encounter for screening for malignant neoplasm of skin: Secondary | ICD-10-CM

## 2021-01-18 NOTE — Progress Notes (Signed)
   Follow-Up Visit   Subjective  Kendra Taylor is a 75 y.o. female who presents for the following: Skin Problem (Has some growths around her lips. They look grey per patient. Also had a growth in the inside lip. It looked liked blister. Check patients nose. Some crusty lesions. Non healing growth on left lower leg. Not healing very well. Still pink. Also check patients ears as she has a history of BCC. ).   The following portions of the chart were reviewed this encounter and updated as appropriate:  Tobacco  Allergies  Meds  Problems  Med Hx  Surg Hx  Fam Hx      Objective  Well appearing patient in no apparent distress; mood and affect are within normal limits.  A focused examination was performed including extremities, including the arms, hands, fingers, and fingernails and the legs, feet, toes, and toenails. Relevant physical exam findings are noted in the Assessment and Plan.  Left Oral Commissure, Right Oral Commissure Small greyish papules in a cluster R > L  face, ears, arms and back No atypical nevi No signs of non-mole skin cancer. Dyspigmented scars clear.   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  Right ear- clear today.  I, Taira Knabe, PA-C, have reviewed all documentation's for this visit.  The documentation on 01/18/21 for the exam, diagnosis, procedures and orders are all accurate and complete.

## 2021-01-24 ENCOUNTER — Ambulatory Visit: Payer: MEDICARE

## 2021-01-29 DIAGNOSIS — M0589 Other rheumatoid arthritis with rheumatoid factor of multiple sites: Secondary | ICD-10-CM | POA: Diagnosis not present

## 2021-02-01 ENCOUNTER — Telehealth: Payer: Self-pay | Admitting: Physician Assistant

## 2021-02-01 MED ORDER — DOXYCYCLINE HYCLATE 100 MG PO TABS
ORAL_TABLET | ORAL | 0 refills | Status: DC
Start: 2021-02-01 — End: 2021-05-30

## 2021-02-01 NOTE — Telephone Encounter (Signed)
Kendra Taylor contacted me regarding a bite with a targetoid lesion forming. I will order doxycycline to cover for lymes disease. She will call back in 2 weeks to check in. She currently has no symptoms.

## 2021-02-19 DIAGNOSIS — H5203 Hypermetropia, bilateral: Secondary | ICD-10-CM | POA: Diagnosis not present

## 2021-02-19 DIAGNOSIS — H2513 Age-related nuclear cataract, bilateral: Secondary | ICD-10-CM | POA: Diagnosis not present

## 2021-02-20 DIAGNOSIS — R519 Headache, unspecified: Secondary | ICD-10-CM | POA: Diagnosis not present

## 2021-02-20 DIAGNOSIS — H1045 Other chronic allergic conjunctivitis: Secondary | ICD-10-CM | POA: Diagnosis not present

## 2021-02-20 DIAGNOSIS — J300 Vasomotor rhinitis: Secondary | ICD-10-CM | POA: Diagnosis not present

## 2021-02-27 DIAGNOSIS — Z8673 Personal history of transient ischemic attack (TIA), and cerebral infarction without residual deficits: Secondary | ICD-10-CM | POA: Diagnosis not present

## 2021-02-27 DIAGNOSIS — R030 Elevated blood-pressure reading, without diagnosis of hypertension: Secondary | ICD-10-CM | POA: Diagnosis not present

## 2021-02-27 DIAGNOSIS — R634 Abnormal weight loss: Secondary | ICD-10-CM | POA: Diagnosis not present

## 2021-03-05 ENCOUNTER — Other Ambulatory Visit: Payer: Self-pay | Admitting: Internal Medicine

## 2021-03-05 DIAGNOSIS — N644 Mastodynia: Secondary | ICD-10-CM

## 2021-03-09 DIAGNOSIS — R918 Other nonspecific abnormal finding of lung field: Secondary | ICD-10-CM | POA: Diagnosis not present

## 2021-03-09 DIAGNOSIS — G43909 Migraine, unspecified, not intractable, without status migrainosus: Secondary | ICD-10-CM | POA: Diagnosis not present

## 2021-03-09 DIAGNOSIS — Z8673 Personal history of transient ischemic attack (TIA), and cerebral infarction without residual deficits: Secondary | ICD-10-CM | POA: Diagnosis not present

## 2021-03-09 DIAGNOSIS — N644 Mastodynia: Secondary | ICD-10-CM | POA: Diagnosis not present

## 2021-03-13 ENCOUNTER — Other Ambulatory Visit (HOSPITAL_BASED_OUTPATIENT_CLINIC_OR_DEPARTMENT_OTHER): Payer: Self-pay

## 2021-03-13 ENCOUNTER — Ambulatory Visit: Payer: MEDICARE | Attending: Internal Medicine

## 2021-03-13 DIAGNOSIS — Z23 Encounter for immunization: Secondary | ICD-10-CM

## 2021-03-13 MED ORDER — PFIZER COVID-19 VAC BIVALENT 30 MCG/0.3ML IM SUSP
INTRAMUSCULAR | 0 refills | Status: DC
  Filled 2021-03-13: qty 0.3, 1d supply, fill #0

## 2021-03-13 NOTE — Progress Notes (Signed)
   Covid-19 Vaccination Clinic  Name:  Jourdan Maldonado    MRN: [id number] DOB: [birth year]  03/13/2021  Ms. Wellborn was observed post Covid-19 immunization for 15 minutes without incident. She was provided with Vaccine Information Sheet and instruction to access the V-Safe system.   Ms. Granville was instructed to call 911 with any severe reactions post vaccine: Difficulty breathing  Swelling of face and throat  A fast heartbeat  A bad rash all over body  Dizziness and weakness   Immunizations Administered     Name Date Dose VIS Date Route   Pfizer Covid-19 Vaccine Bivalent Booster 03/13/2021 10:47 AM 0.3 mL 12/27/2020 Intramuscular   Manufacturer: [employer]   Lot: [id number]   Candelaria Arenas: [phone]

## 2021-03-16 DIAGNOSIS — N644 Mastodynia: Secondary | ICD-10-CM | POA: Diagnosis not present

## 2021-03-16 DIAGNOSIS — R922 Inconclusive mammogram: Secondary | ICD-10-CM | POA: Diagnosis not present

## 2021-03-16 DIAGNOSIS — N6321 Unspecified lump in the left breast, upper outer quadrant: Secondary | ICD-10-CM | POA: Diagnosis not present

## 2021-03-19 ENCOUNTER — Ambulatory Visit (INDEPENDENT_AMBULATORY_CARE_PROVIDER_SITE_OTHER): Payer: MEDICARE | Admitting: Pulmonary Disease

## 2021-03-19 ENCOUNTER — Other Ambulatory Visit: Payer: Self-pay

## 2021-03-19 ENCOUNTER — Encounter: Payer: Self-pay | Admitting: Pulmonary Disease

## 2021-03-19 VITALS — BP 122/66 | HR 95 | Temp 97.8°F | Ht 63.0 in | Wt 114.0 lb

## 2021-03-19 DIAGNOSIS — R911 Solitary pulmonary nodule: Secondary | ICD-10-CM

## 2021-03-19 DIAGNOSIS — Z789 Other specified health status: Secondary | ICD-10-CM | POA: Diagnosis not present

## 2021-03-19 DIAGNOSIS — M069 Rheumatoid arthritis, unspecified: Secondary | ICD-10-CM | POA: Diagnosis not present

## 2021-03-19 DIAGNOSIS — M0589 Other rheumatoid arthritis with rheumatoid factor of multiple sites: Secondary | ICD-10-CM | POA: Diagnosis not present

## 2021-03-19 NOTE — Progress Notes (Signed)
Synopsis: Referred in November 2022 for lung mass by Deland Pretty, MD  Subjective:   PATIENT ID: Kendra Taylor GENDER: female DOB: 07/11/1945, MRN: [id number]  Chief Complaint  Patient presents with   Consult    This is a 75 year old female with a past medical history of reflux, basal cell carcinoma, anxiety, rheumatoid arthritis on Remicade infusion.  Here for evaluation of a left lower lobe pulmonary nodule.  In 2018 she was taken for bronchoscopy for a left lower lobe pulmonary nodule.  There was no evidence of malignancy on tissue sampling.February 26, 2017 patient was taken for navigational bronchoscopy by Dr. Lamonte Sakai.  Biopsy of the lesion was negative.  She did not have any additional follow-up and failed to follow-up after the bronchoscopy.  She had a repeat coronary CT scan in May 2022.  This coronary CT scan revealed a 3.6 x 2.4 cm mass within the left lung.  Slightly enlarged from her October 2017 2018 CT.   Past Medical History:  Diagnosis Date   Anemia    Anxiety    Arthritis    Atypical mole 01/08/2013   right inner forearm   Basal cell carcinoma 03/12/2017   right concha exc mohs   Depression    Dyspnea    GERD (gastroesophageal reflux disease)    HA (headache)    Migraine    Sleep apnea    cpap     Family History  Problem Relation Age of Onset   Dementia Mother    Sleep apnea Mother    Hyperthyroidism Mother    Dementia Father    Sleep apnea Father      Past Surgical History:  Procedure Laterality Date   ABDOMINAL HYSTERECTOMY     BUNIONECTOMY     both   FINGER ARTHROPLASTY Right 07/30/2012   Procedure: RIGHT INDEX FINGER, MIDDLE FINGER, RING FINGER, SMALL FINGER MCP ARTHROPLASTY WITH REALIGNMENT/REPAIR OF EXTENSOR TENDON;  Surgeon: Roseanne Kaufman, MD;  Location: [facility];  Service: Orthopedics;  Laterality: Right;   FINGER SURGERY     HAMMER TOE SURGERY Left 03/03/2014   Procedure: SECOND THROUGH FIFTH HAMMERTOE CORRECTION;  Surgeon: Wylene Simmer,  MD;  Location: [facility];  Service: Orthopedics;  Laterality: Left;   JOINT REPLACEMENT     METATARSAL HEAD EXCISION Left 03/03/2014   Procedure: LEFT SECOND THROUGH FIFTH METATARSAL HEAD EXCISION;  Surgeon: Wylene Simmer, MD;  Location: [facility];  Service: Orthopedics;  Laterality: Left;   OOPHORECTOMY     VIDEO BRONCHOSCOPY Bilateral 12/18/2016   Procedure: VIDEO BRONCHOSCOPY WITH FLUORO;  Surgeon: Tanda Rockers, MD;  Location: [facility];  Service: Cardiopulmonary;  Laterality: Bilateral;   VIDEO BRONCHOSCOPY WITH ENDOBRONCHIAL NAVIGATION Left 02/26/2017   Procedure: VIDEO BRONCHOSCOPY WITH ENDOBRONCHIAL NAVIGATION;  Surgeon: Collene Gobble, MD;  Location: [facility];  Service: Thoracic;  Laterality: Left;    Social History   Socioeconomic History   Marital status: Married    Spouse name: Remo Lipps   Number of children: 0   Years of education: masters   Highest education level: Not on file  Occupational History    Comment: Retired  Tobacco Use   Smoking status: Never   Smokeless tobacco: Never  Vaping Use   Vaping Use: Not on file  Substance and Sexual Activity   Alcohol use: No    Alcohol/week: 0.0 standard drinks   Drug use: No   Sexual activity: Not on file  Other Topics Concern   Not on file  Social History Narrative   Patient lives at home with her spouse (Paltrineri Remo Lipps.) Patient has two cats.   Retired.   Arboriculturist.   Right handed.   Caffeine One cup of coffee daily.   Social Determinants of Health   Financial Resource Strain: Not on file  Food Insecurity: Not on file  Transportation Needs: Not on file  Physical Activity: Not on file  Stress: Not on file  Social Connections: Not on file  Intimate Partner Violence: Not on file     Allergies  Allergen Reactions   Aspirin Other (See Comments)    Ringing in ears with large doses, large doses causes her to lose hearing     Outpatient Medications Prior to Visit  Medication  Sig Dispense Refill   alendronate (FOSAMAX) 70 MG tablet TK 1 T PO ONCE A WEEK FOR 28 DAYS  3   aspirin 81 MG EC tablet 1 tablet     aspirin EC 81 MG EC tablet Take 1 tablet (81 mg total) by mouth daily. Swallow whole. 30 tablet 11   atorvastatin (LIPITOR) 80 MG tablet Take 1 tablet (80 mg total) by mouth daily. 30 tablet 1   atorvastatin (LIPITOR) 80 MG tablet 1 tablet     Cholecalciferol (VITAMIN D) 2000 units tablet Take 2,000 Units by mouth daily.      COVID-19 mRNA bivalent vaccine, Pfizer, (PFIZER COVID-19 VAC BIVALENT) injection Inject into the muscle. 0.3 mL 0   doxycycline (VIBRA-TABS) 100 MG tablet Take 1 po bid x 2 weeks with food 28 tablet 0   fluticasone (FLONASE) 50 MCG/ACT nasal spray SMARTSIG:1-2 Spray(s) Both Nares Daily     folic acid (FOLVITE) 1 MG tablet Take 1 mg by mouth daily.     inFLIXimab (REMICADE) 100 MG injection Inject into the vein every 8 (eight) weeks.      levocetirizine (XYZAL) 5 MG tablet Take 5 mg by mouth daily as needed for allergies.      magnesium gluconate (MAGONATE) 500 MG tablet Take 500 mg by mouth every other day.      methotrexate (RHEUMATREX) 2.5 MG tablet TK 6 TS PO WEEKLY     montelukast (SINGULAIR) 10 MG tablet Take 10 mg by mouth daily.     olopatadine (PATANOL) 0.1 % ophthalmic solution 1 drop 2 (two) times daily.     ondansetron (ZOFRAN ODT) 4 MG disintegrating tablet Take 1 tablet (4 mg total) by mouth every 8 (eight) hours as needed for nausea or vomiting. 20 tablet 6   pantoprazole (PROTONIX) 20 MG tablet Take 20 mg by mouth daily.     pantoprazole (PROTONIX) 40 MG tablet Take 40 mg by mouth every morning.     Polyethyl Glycol-Propyl Glycol (SYSTANE OP) Place 2 drops into both eyes as needed (for dry eyes).     Rimegepant Sulfate (NURTEC) 75 MG TBDP Take 1 tab at onset of migraine.  May repeat in 2 hrs, if needed.  Max dose: 2 tabs/day. This is a 30 day prescription. 12 tablet 11   tiZANidine (ZANAFLEX) 4 MG tablet Take 1 tablet (4 mg  total) by mouth every 6 (six) hours as needed. 30 tablet 6   triamcinolone cream (KENALOG) 0.1 % Apply 1 application topically as needed (for skin irritation).     Ubrogepant (UBRELVY) 50 MG TABS 1 tablet may take second dose at least 2 hours after first dose as needed     vitamin B-12 (CYANOCOBALAMIN) 1000 MCG tablet Take 1,000 mcg by mouth  daily.     No facility-administered medications prior to visit.    Review of Systems  Constitutional:  Negative for chills, fever, malaise/fatigue and weight loss.  HENT:  Negative for hearing loss, sore throat and tinnitus.   Eyes:  Negative for blurred vision and double vision.  Respiratory:  Negative for cough, hemoptysis, sputum production, shortness of breath, wheezing and stridor.   Cardiovascular:  Negative for chest pain, palpitations, orthopnea, leg swelling and PND.  Gastrointestinal:  Negative for abdominal pain, constipation, diarrhea, heartburn, nausea and vomiting.  Genitourinary:  Negative for dysuria, hematuria and urgency.  Musculoskeletal:  Positive for joint pain. Negative for myalgias.  Skin:  Negative for itching and rash.  Neurological:  Negative for dizziness, tingling, weakness and headaches.  Endo/Heme/Allergies:  Negative for environmental allergies. Does not bruise/bleed easily.  Psychiatric/Behavioral:  Negative for depression. The patient is not nervous/anxious and does not have insomnia.   All other systems reviewed and are negative.   Objective:  Physical Exam Vitals reviewed.  Constitutional:      General: She is not in acute distress.    Appearance: She is well-developed.  HENT:     Head: Normocephalic and atraumatic.  Eyes:     General: No scleral icterus.    Conjunctiva/sclera: Conjunctivae normal.     Pupils: Pupils are equal, round, and reactive to light.  Neck:     Vascular: No JVD.     Trachea: No tracheal deviation.  Cardiovascular:     Rate and Rhythm: Normal rate and regular rhythm.     Heart  sounds: Normal heart sounds. No murmur heard. Pulmonary:     Effort: Pulmonary effort is normal. No tachypnea, accessory muscle usage or respiratory distress.     Breath sounds: No stridor. No wheezing, rhonchi or rales.  Abdominal:     General: There is no distension.     Palpations: Abdomen is soft.     Tenderness: There is no abdominal tenderness.  Musculoskeletal:        General: Deformity present. No tenderness.     Cervical back: Neck supple.     Comments: Ulnar deviation of the left phalanges  due to history of rheumatoid arthritis  Lymphadenopathy:     Cervical: No cervical adenopathy.  Skin:    General: Skin is warm and dry.     Capillary Refill: Capillary refill takes less than 2 seconds.     Findings: No rash.  Neurological:     Mental Status: She is alert and oriented to person, place, and time.  Psychiatric:        Behavior: Behavior normal.     Vitals:   03/19/21 1428  BP: 122/66  Pulse: 95  Temp: 97.8 F (36.6 C)  TempSrc: Oral  SpO2: 98%  Weight: 114 lb (51.7 kg)  Height: 5\' 3"  (1.6 m)   98% RA BMI Readings from Last 3 Encounters:  03/19/21 20.19 kg/m  01/08/21 21.22 kg/m  12/26/20 20.85 kg/m   Wt Readings from Last 3 Encounters:  03/19/21 114 lb (51.7 kg)  01/08/21 116 lb (52.6 kg)  12/26/20 114 lb (51.7 kg)     CBC    Component Value Date/Time   WBC 10.6 (H) 12/26/2020 1242   WBC 10.5 12/26/2020 1242   RBC 3.99 12/26/2020 1242   RBC 3.99 12/26/2020 1242   HGB 11.5 (L) 12/26/2020 1242   HGB 11.6 (L) 12/26/2020 1242   HCT 34.7 (L) 12/26/2020 1242   HCT 34.9 (L) 12/26/2020 1242  PLT 247 12/26/2020 1242   PLT 259 12/26/2020 1242   MCV 87.0 12/26/2020 1242   MCV 87.5 12/26/2020 1242   MCH 28.8 12/26/2020 1242   MCH 29.1 12/26/2020 1242   MCHC 33.1 12/26/2020 1242   MCHC 33.2 12/26/2020 1242   RDW 13.1 12/26/2020 1242   RDW 13.0 12/26/2020 1242   LYMPHSABS 1.3 12/26/2020 1242   MONOABS 1.1 (H) 12/26/2020 1242   EOSABS 0.1  12/26/2020 1242   BASOSABS 0.0 12/26/2020 1242    Chest Imaging: May 22nd 2022: Coronary CT imaging: A portion of the left lower lobe observed on CT imaging reveals a 3.6 cm left lower lobe mass. The patient's images have been independently reviewed by me.    Pulmonary Functions Testing Results: PFT Results Latest Ref Rng & Units 11/23/2015  FVC-Pre L 3.17  FVC-Predicted Pre % 114  FVC-Post L 3.05  FVC-Predicted Post % 110  Pre FEV1/FVC % % 83  Post FEV1/FCV % % 83  FEV1-Pre L 2.62  FEV1-Predicted Pre % 126  FEV1-Post L 2.54  DLCO uncorrected ml/min/mmHg 18.57  DLCO UNC% % 82  DLCO corrected ml/min/mmHg 18.04  DLCO COR %Predicted % 79  DLVA Predicted % 83    FeNO:   Pathology:   Echocardiogram:   Heart Catheterization:     Assessment & Plan:     ICD-10-CM   1. Lung nodule  R91.1 NM PET Image Initial (PI) Skull Base To Thigh (F-18 FDG)    2. Rheumatoid arthritis involving multiple sites, unspecified whether rheumatoid factor present ([place])  M06.9     3. Non-smoker  Z78.9       Discussion:  This is a 75 year old female, history of rheumatoid arthritis on Remicade infusion, non-smoker.  Found to have a left lower lobe pulmonary nodule in 2018 was initially taken for navigational bronchoscopy and tissue sampling.  Repeat imaging in 2022 shows that this nodule has enlarged in size and now 3.6 cm.  Plan: Today in the office we discussed the probability of malignancy associated with slow progressive lesions like hers within the chest. She has not had a nuclear medicine PET scan in the past. I think next best step to help determine probability of malignancy we start with a PET scan. Based on the PET scan results we will discuss next steps. However likely will end up proceeding with repeat bronchoscopy and biopsy. We discussed this in detail today in the office. Patient is agreeable to this plan if needed.    Current Outpatient Medications:    alendronate (FOSAMAX)  70 MG tablet, TK 1 T PO ONCE A WEEK FOR 28 DAYS, Disp: , Rfl: 3   aspirin 81 MG EC tablet, 1 tablet, Disp: , Rfl:    aspirin EC 81 MG EC tablet, Take 1 tablet (81 mg total) by mouth daily. Swallow whole., Disp: 30 tablet, Rfl: 11   atorvastatin (LIPITOR) 80 MG tablet, Take 1 tablet (80 mg total) by mouth daily., Disp: 30 tablet, Rfl: 1   atorvastatin (LIPITOR) 80 MG tablet, 1 tablet, Disp: , Rfl:    Cholecalciferol (VITAMIN D) 2000 units tablet, Take 2,000 Units by mouth daily. , Disp: , Rfl:    COVID-19 mRNA bivalent vaccine, Pfizer, (PFIZER COVID-19 VAC BIVALENT) injection, Inject into the muscle., Disp: 0.3 mL, Rfl: 0   doxycycline (VIBRA-TABS) 100 MG tablet, Take 1 po bid x 2 weeks with food, Disp: 28 tablet, Rfl: 0   fluticasone (FLONASE) 50 MCG/ACT nasal spray, SMARTSIG:1-2 Spray(s) Both Nares Daily, Disp: ,  Rfl:    folic acid (FOLVITE) 1 MG tablet, Take 1 mg by mouth daily., Disp: , Rfl:    inFLIXimab (REMICADE) 100 MG injection, Inject into the vein every 8 (eight) weeks. , Disp: , Rfl:    levocetirizine (XYZAL) 5 MG tablet, Take 5 mg by mouth daily as needed for allergies. , Disp: , Rfl:    magnesium gluconate (MAGONATE) 500 MG tablet, Take 500 mg by mouth every other day. , Disp: , Rfl:    methotrexate (RHEUMATREX) 2.5 MG tablet, TK 6 TS PO WEEKLY, Disp: , Rfl:    montelukast (SINGULAIR) 10 MG tablet, Take 10 mg by mouth daily., Disp: , Rfl:    olopatadine (PATANOL) 0.1 % ophthalmic solution, 1 drop 2 (two) times daily., Disp: , Rfl:    ondansetron (ZOFRAN ODT) 4 MG disintegrating tablet, Take 1 tablet (4 mg total) by mouth every 8 (eight) hours as needed for nausea or vomiting., Disp: 20 tablet, Rfl: 6   pantoprazole (PROTONIX) 20 MG tablet, Take 20 mg by mouth daily., Disp: , Rfl:    pantoprazole (PROTONIX) 40 MG tablet, Take 40 mg by mouth every morning., Disp: , Rfl:    Polyethyl Glycol-Propyl Glycol (SYSTANE OP), Place 2 drops into both eyes as needed (for dry eyes)., Disp: , Rfl:     Rimegepant Sulfate (NURTEC) 75 MG TBDP, Take 1 tab at onset of migraine.  May repeat in 2 hrs, if needed.  Max dose: 2 tabs/day. This is a 30 day prescription., Disp: 12 tablet, Rfl: 11   tiZANidine (ZANAFLEX) 4 MG tablet, Take 1 tablet (4 mg total) by mouth every 6 (six) hours as needed., Disp: 30 tablet, Rfl: 6   triamcinolone cream (KENALOG) 0.1 %, Apply 1 application topically as needed (for skin irritation)., Disp: , Rfl:    Ubrogepant (UBRELVY) 50 MG TABS, 1 tablet may take second dose at least 2 hours after first dose as needed, Disp: , Rfl:    vitamin B-12 (CYANOCOBALAMIN) 1000 MCG tablet, Take 1,000 mcg by mouth daily., Disp: , Rfl:   I spent 42 minutes dedicated to the care of this patient on the date of this encounter to include pre-visit review of records, face-to-face time with the patient discussing conditions above, post visit ordering of testing, clinical documentation with the electronic health record, making appropriate referrals as documented, and communicating necessary findings to members of the patients care team.   [name], DO [facility] 03/19/2021 3:01 PM

## 2021-03-19 NOTE — Patient Instructions (Signed)
Thank you for visiting Dr. Valeta Harms at [facility]. Today we recommend the following:  Orders Placed This Encounter  Procedures   NM PET Image Initial (PI) Skull Base To Thigh (F-18 FDG)   Return in about 3 weeks (around 04/09/2021) for with Eric Form, NP, or Dr. Valeta Harms. Please scheduled after PET is complete.     Please do your part to reduce the spread of COVID-19.

## 2021-03-28 ENCOUNTER — Other Ambulatory Visit: Payer: Self-pay

## 2021-03-28 ENCOUNTER — Ambulatory Visit (HOSPITAL_COMMUNITY)
Admission: RE | Admit: 2021-03-28 | Discharge: 2021-03-28 | Disposition: A | Discharge status: Home / Self Care | Payer: MEDICARE | Source: Ambulatory Visit | Attending: Pulmonary Disease | Admitting: Pulmonary Disease

## 2021-03-28 DIAGNOSIS — R911 Solitary pulmonary nodule: Secondary | ICD-10-CM | POA: Diagnosis not present

## 2021-03-28 DIAGNOSIS — J439 Emphysema, unspecified: Secondary | ICD-10-CM | POA: Diagnosis not present

## 2021-03-28 LAB — GLUCOSE, CAPILLARY: Glucose-Capillary: 89 mg/dL (ref 70–99)

## 2021-03-28 MED ORDER — FLUDEOXYGLUCOSE F - 18 (FDG) INJECTION
5.9000 | Freq: Once | INTRAVENOUS | Status: AC
  Administered 2021-03-28: 5.9 via INTRAVENOUS

## 2021-03-29 ENCOUNTER — Telehealth: Payer: Self-pay | Admitting: Pulmonary Disease

## 2021-03-29 NOTE — Telephone Encounter (Signed)
Pt needs 15 min appt with BI next wk to review PET results- LMTCB

## 2021-03-30 NOTE — Telephone Encounter (Signed)
Patient is scheduled 12/7 at 12pm with BI. Nothing further needed.

## 2021-04-04 ENCOUNTER — Other Ambulatory Visit: Payer: Self-pay

## 2021-04-04 ENCOUNTER — Telehealth: Payer: Self-pay | Admitting: Pulmonary Disease

## 2021-04-04 ENCOUNTER — Encounter: Payer: Self-pay | Admitting: Pulmonary Disease

## 2021-04-04 ENCOUNTER — Ambulatory Visit (INDEPENDENT_AMBULATORY_CARE_PROVIDER_SITE_OTHER): Payer: MEDICARE | Admitting: Pulmonary Disease

## 2021-04-04 VITALS — BP 120/80 | HR 77 | Temp 98.2°F | Ht 63.0 in | Wt 118.0 lb

## 2021-04-04 DIAGNOSIS — R911 Solitary pulmonary nodule: Secondary | ICD-10-CM | POA: Diagnosis not present

## 2021-04-04 DIAGNOSIS — Z789 Other specified health status: Secondary | ICD-10-CM

## 2021-04-04 DIAGNOSIS — M069 Rheumatoid arthritis, unspecified: Secondary | ICD-10-CM | POA: Diagnosis not present

## 2021-04-04 NOTE — Telephone Encounter (Signed)
Per Dr Valeta Harms- pt needs PFT asap  I spoke with June Leap and she said we could put her on her schedule for this on Monday 04/09/21 at 2 pm  LMTCB for the pt to see if this works for her and will then schedule

## 2021-04-04 NOTE — Progress Notes (Signed)
Synopsis: Referred in November 2022 for lung mass by Deland Pretty, MD  Subjective:   PATIENT ID: Kendra Taylor GENDER: female DOB: Nov 27, 1945, MRN: [id number]  Chief Complaint  Patient presents with   Follow-up    Patient is here to talk about her left lung     This is a 75 year old female with a past medical history of reflux, basal cell carcinoma, anxiety, rheumatoid arthritis on Remicade infusion.  Here for evaluation of a left lower lobe pulmonary nodule.  In 2018 she was taken for bronchoscopy for a left lower lobe pulmonary nodule.  There was no evidence of malignancy on tissue sampling.February 26, 2017 patient was taken for navigational bronchoscopy by Dr. Lamonte Sakai.  Biopsy of the lesion was negative.  She did not have any additional follow-up and failed to follow-up after the bronchoscopy.  She had a repeat coronary CT scan in May 2022.  This coronary CT scan revealed a 3.6 x 2.4 cm mass within the left lung.  Slightly enlarged from her October 2017 2018 CT.  OV 04/04/2021: Here today for follow-up after recent nuclear medicine pet imaging. Patient had nuclear medicine pet image completed on 03/28/2021.  This revealed low-level metabolic uptake within the near 3 cm part solid left lower lobe lesion.  No associated adenopathy.   Past Medical History:  Diagnosis Date   Anemia    Anxiety    Arthritis    Atypical mole 01/08/2013   right inner forearm   Basal cell carcinoma 03/12/2017   right concha exc mohs   Depression    Dyspnea    GERD (gastroesophageal reflux disease)    HA (headache)    Migraine    Sleep apnea    cpap     Family History  Problem Relation Age of Onset   Dementia Mother    Sleep apnea Mother    Hyperthyroidism Mother    Dementia Father    Sleep apnea Father      Past Surgical History:  Procedure Laterality Date   ABDOMINAL HYSTERECTOMY     BUNIONECTOMY     both   FINGER ARTHROPLASTY Right 07/30/2012   Procedure: RIGHT INDEX FINGER,  MIDDLE FINGER, RING FINGER, SMALL FINGER MCP ARTHROPLASTY WITH REALIGNMENT/REPAIR OF EXTENSOR TENDON;  Surgeon: Roseanne Kaufman, MD;  Location: [place];  Service: Orthopedics;  Laterality: Right;   FINGER SURGERY     HAMMER TOE SURGERY Left 03/03/2014   Procedure: SECOND THROUGH FIFTH HAMMERTOE CORRECTION;  Surgeon: Wylene Simmer, MD;  Location: [facility];  Service: Orthopedics;  Laterality: Left;   JOINT REPLACEMENT     METATARSAL HEAD EXCISION Left 03/03/2014   Procedure: LEFT SECOND THROUGH FIFTH METATARSAL HEAD EXCISION;  Surgeon: Wylene Simmer, MD;  Location: [place] [facility];  Service: Orthopedics;  Laterality: Left;   OOPHORECTOMY     VIDEO BRONCHOSCOPY Bilateral 12/18/2016   Procedure: VIDEO BRONCHOSCOPY WITH FLUORO;  Surgeon: Tanda Rockers, MD;  Location: [facility];  Service: Cardiopulmonary;  Laterality: Bilateral;   VIDEO BRONCHOSCOPY WITH ENDOBRONCHIAL NAVIGATION Left 02/26/2017   Procedure: VIDEO BRONCHOSCOPY WITH ENDOBRONCHIAL NAVIGATION;  Surgeon: Collene Gobble, MD;  Location: [facility];  Service: Thoracic;  Laterality: Left;    Social History   Socioeconomic History   Marital status: Married    Spouse name: Remo Lipps   Number of children: 0   Years of education: masters   Highest education level: Not on file  Occupational History    Comment: Retired  Tobacco Use  Smoking status: Never   Smokeless tobacco: Never  Vaping Use   Vaping Use: Not on file  Substance and Sexual Activity   Alcohol use: No    Alcohol/week: 0.0 standard drinks   Drug use: No   Sexual activity: Not on file  Other Topics Concern   Not on file  Social History Narrative   Patient lives at home with her spouse (Paltrineri Remo Lipps.) Patient has two cats.   Retired.   Arboriculturist.   Right handed.   Caffeine One cup of coffee daily.   Social Determinants of Health   Financial Resource Strain: Not on file  Food Insecurity: Not on file  Transportation Needs: Not on file   Physical Activity: Not on file  Stress: Not on file  Social Connections: Not on file  Intimate Partner Violence: Not on file     Allergies  Allergen Reactions   Aspirin Other (See Comments)    Ringing in ears with large doses, large doses causes her to lose hearing     Outpatient Medications Prior to Visit  Medication Sig Dispense Refill   alendronate (FOSAMAX) 70 MG tablet TK 1 T PO ONCE A WEEK FOR 28 DAYS  3   aspirin 81 MG EC tablet 1 tablet     aspirin EC 81 MG EC tablet Take 1 tablet (81 mg total) by mouth daily. Swallow whole. 30 tablet 11   atorvastatin (LIPITOR) 80 MG tablet Take 1 tablet (80 mg total) by mouth daily. 30 tablet 1   atorvastatin (LIPITOR) 80 MG tablet 1 tablet     Cholecalciferol (VITAMIN D) 2000 units tablet Take 2,000 Units by mouth daily.      COVID-19 mRNA bivalent vaccine, Pfizer, (PFIZER COVID-19 VAC BIVALENT) injection Inject into the muscle. 0.3 mL 0   doxycycline (VIBRA-TABS) 100 MG tablet Take 1 po bid x 2 weeks with food 28 tablet 0   fluticasone (FLONASE) 50 MCG/ACT nasal spray SMARTSIG:1-2 Spray(s) Both Nares Daily     folic acid (FOLVITE) 1 MG tablet Take 1 mg by mouth daily.     inFLIXimab (REMICADE) 100 MG injection Inject into the vein every 8 (eight) weeks.      levocetirizine (XYZAL) 5 MG tablet Take 5 mg by mouth daily as needed for allergies.      magnesium gluconate (MAGONATE) 500 MG tablet Take 500 mg by mouth every other day.      methotrexate (RHEUMATREX) 2.5 MG tablet TK 6 TS PO WEEKLY     montelukast (SINGULAIR) 10 MG tablet Take 10 mg by mouth daily.     olopatadine (PATANOL) 0.1 % ophthalmic solution 1 drop 2 (two) times daily.     ondansetron (ZOFRAN ODT) 4 MG disintegrating tablet Take 1 tablet (4 mg total) by mouth every 8 (eight) hours as needed for nausea or vomiting. 20 tablet 6   pantoprazole (PROTONIX) 20 MG tablet Take 20 mg by mouth daily.     pantoprazole (PROTONIX) 40 MG tablet Take 40 mg by mouth every morning.      Polyethyl Glycol-Propyl Glycol (SYSTANE OP) Place 2 drops into both eyes as needed (for dry eyes).     Rimegepant Sulfate (NURTEC) 75 MG TBDP Take 1 tab at onset of migraine.  May repeat in 2 hrs, if needed.  Max dose: 2 tabs/day. This is a 30 day prescription. 12 tablet 11   tiZANidine (ZANAFLEX) 4 MG tablet Take 1 tablet (4 mg total) by mouth every 6 (six) hours as needed. Victor  tablet 6   triamcinolone cream (KENALOG) 0.1 % Apply 1 application topically as needed (for skin irritation).     Ubrogepant (UBRELVY) 50 MG TABS 1 tablet may take second dose at least 2 hours after first dose as needed     vitamin B-12 (CYANOCOBALAMIN) 1000 MCG tablet Take 1,000 mcg by mouth daily.     No facility-administered medications prior to visit.    Review of Systems  Constitutional:  Negative for chills, fever, malaise/fatigue and weight loss.  HENT:  Negative for hearing loss, sore throat and tinnitus.   Eyes:  Negative for blurred vision and double vision.  Respiratory:  Negative for cough, hemoptysis, sputum production, shortness of breath, wheezing and stridor.   Cardiovascular:  Negative for chest pain, palpitations, orthopnea, leg swelling and PND.  Gastrointestinal:  Negative for abdominal pain, constipation, diarrhea, heartburn, nausea and vomiting.  Genitourinary:  Negative for dysuria, hematuria and urgency.  Musculoskeletal:  Positive for joint pain. Negative for myalgias.  Skin:  Negative for itching and rash.  Neurological:  Negative for dizziness, tingling, weakness and headaches.  Endo/Heme/Allergies:  Negative for environmental allergies. Does not bruise/bleed easily.  Psychiatric/Behavioral:  Negative for depression. The patient is not nervous/anxious and does not have insomnia.   All other systems reviewed and are negative.   Objective:  Physical Exam Vitals reviewed.  Constitutional:      General: She is not in acute distress.    Appearance: She is well-developed.  HENT:     Head:  Normocephalic and atraumatic.  Eyes:     General: No scleral icterus.    Conjunctiva/sclera: Conjunctivae normal.     Pupils: Pupils are equal, round, and reactive to light.  Neck:     Vascular: No JVD.     Trachea: No tracheal deviation.  Cardiovascular:     Rate and Rhythm: Normal rate and regular rhythm.     Heart sounds: Normal heart sounds. No murmur heard. Pulmonary:     Effort: Pulmonary effort is normal. No tachypnea, accessory muscle usage or respiratory distress.     Breath sounds: No stridor. No wheezing, rhonchi or rales.  Abdominal:     General: There is no distension.     Palpations: Abdomen is soft.     Tenderness: There is no abdominal tenderness.  Musculoskeletal:        General: Deformity present. No tenderness.     Cervical back: Neck supple.     Comments: Ulnar deviation   Lymphadenopathy:     Cervical: No cervical adenopathy.  Skin:    General: Skin is warm and dry.     Capillary Refill: Capillary refill takes less than 2 seconds.     Findings: No rash.  Neurological:     Mental Status: She is alert and oriented to person, place, and time.  Psychiatric:        Behavior: Behavior normal.     Vitals:   04/04/21 1210  BP: 120/80  Pulse: 77  Temp: 98.2 F (36.8 C)  TempSrc: Oral  SpO2: 99%  Weight: 118 lb (53.5 kg)  Height: 5\' 3"  (1.6 m)    99% RA BMI Readings from Last 3 Encounters:  04/04/21 20.90 kg/m  03/19/21 20.19 kg/m  01/08/21 21.22 kg/m   Wt Readings from Last 3 Encounters:  04/04/21 118 lb (53.5 kg)  03/19/21 114 lb (51.7 kg)  01/08/21 116 lb (52.6 kg)     CBC    Component Value Date/Time   WBC 10.6 (H) 12/26/2020  1242   WBC 10.5 12/26/2020 1242   RBC 3.99 12/26/2020 1242   RBC 3.99 12/26/2020 1242   HGB 11.5 (L) 12/26/2020 1242   HGB 11.6 (L) 12/26/2020 1242   HCT 34.7 (L) 12/26/2020 1242   HCT 34.9 (L) 12/26/2020 1242   PLT 247 12/26/2020 1242   PLT 259 12/26/2020 1242   MCV 87.0 12/26/2020 1242   MCV 87.5  12/26/2020 1242   MCH 28.8 12/26/2020 1242   MCH 29.1 12/26/2020 1242   MCHC 33.1 12/26/2020 1242   MCHC 33.2 12/26/2020 1242   RDW 13.1 12/26/2020 1242   RDW 13.0 12/26/2020 1242   LYMPHSABS 1.3 12/26/2020 1242   MONOABS 1.1 (H) 12/26/2020 1242   EOSABS 0.1 12/26/2020 1242   BASOSABS 0.0 12/26/2020 1242    Chest Imaging: May 22nd 2022: Coronary CT imaging: A portion of the left lower lobe observed on CT imaging reveals a 3.6 cm left lower lobe mass. The patient's images have been independently reviewed by me.    03/28/2021 nuclear medicine pet imaging: Low-level hypermetabolic uptake within the left lower lobe lesion. The patient's images have been independently reviewed by me.    Pulmonary Functions Testing Results: PFT Results Latest Ref Rng & Units 11/23/2015  FVC-Pre L 3.17  FVC-Predicted Pre % 114  FVC-Post L 3.05  FVC-Predicted Post % 110  Pre FEV1/FVC % % 83  Post FEV1/FCV % % 83  FEV1-Pre L 2.62  FEV1-Predicted Pre % 126  FEV1-Post L 2.54  DLCO uncorrected ml/min/mmHg 18.57  DLCO UNC% % 82  DLCO corrected ml/min/mmHg 18.04  DLCO COR %Predicted % 79  DLVA Predicted % 83    FeNO:   Pathology:   Echocardiogram:   Heart Catheterization:     Assessment & Plan:     ICD-10-CM   1. Lung nodule  R91.1 Pulmonary Function Test    Ambulatory referral to Cardiothoracic Surgery    2. Non-smoker  Z78.9 Pulmonary Function Test    Ambulatory referral to Cardiothoracic Surgery    3. Rheumatoid arthritis involving multiple sites, unspecified whether rheumatoid factor present ([place])  M06.9        Discussion:  This is a 75 year old female, history of rheumatoid arthritis on Remicade infusion, non-smoker.  Found to have a left lower lobe pulmonary nodule that dates back since 2017.  It showed some slow enlargement to 2018 underwent navigational bronchoscopy tissue sampling that was negative.  Patient was lost to follow-up.  She had repeat imaging in 2022 that showed  this nodule had enlarged now at 3.6 cm.  We underwent nuclear medicine pet imaging which shows low-level PET uptake.  Plan: Today in the office we talked about all of the possible avenues moving forward. 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 I also discussed the possibilities of her undergoing consideration for surgery versus moving forward with tissue biopsy and other treatment options such as radiation. She is interested and at least speaking with the thoracic surgeon about removal. Also explained that he may think that we need to move forward with biopsy first or consideration of doing this is a combination case. Next steps, we will plan for referral to thoracic surgery as well as getting PFTs ASAP. We will try to get these done next week to help expedite appointment with thoracic surgery.  If we need to biopsy this thing before considerations for surgery I am happy  to set patient up for robotic assisted navigational bronchoscopy..    Current Outpatient Medications:    alendronate (FOSAMAX) 70 MG tablet, TK 1 T PO ONCE A WEEK FOR 28 DAYS, Disp: , Rfl: 3   aspirin 81 MG EC tablet, 1 tablet, Disp: , Rfl:    aspirin EC 81 MG EC tablet, Take 1 tablet (81 mg total) by mouth daily. Swallow whole., Disp: 30 tablet, Rfl: 11   atorvastatin (LIPITOR) 80 MG tablet, Take 1 tablet (80 mg total) by mouth daily., Disp: 30 tablet, Rfl: 1   atorvastatin (LIPITOR) 80 MG tablet, 1 tablet, Disp: , Rfl:    Cholecalciferol (VITAMIN D) 2000 units tablet, Take 2,000 Units by mouth daily. , Disp: , Rfl:    COVID-19 mRNA bivalent vaccine, Pfizer, (PFIZER COVID-19 VAC BIVALENT) injection, Inject into the muscle., Disp: 0.3 mL, Rfl: 0   doxycycline (VIBRA-TABS) 100 MG tablet, Take 1 po bid x 2 weeks with food, Disp: 28 tablet, Rfl: 0   fluticasone (FLONASE) 50 MCG/ACT nasal spray, SMARTSIG:1-2 Spray(s) Both Nares  Daily, Disp: , Rfl:    folic acid (FOLVITE) 1 MG tablet, Take 1 mg by mouth daily., Disp: , Rfl:    inFLIXimab (REMICADE) 100 MG injection, Inject into the vein every 8 (eight) weeks. , Disp: , Rfl:    levocetirizine (XYZAL) 5 MG tablet, Take 5 mg by mouth daily as needed for allergies. , Disp: , Rfl:    magnesium gluconate (MAGONATE) 500 MG tablet, Take 500 mg by mouth every other day. , Disp: , Rfl:    methotrexate (RHEUMATREX) 2.5 MG tablet, TK 6 TS PO WEEKLY, Disp: , Rfl:    montelukast (SINGULAIR) 10 MG tablet, Take 10 mg by mouth daily., Disp: , Rfl:    olopatadine (PATANOL) 0.1 % ophthalmic solution, 1 drop 2 (two) times daily., Disp: , Rfl:    ondansetron (ZOFRAN ODT) 4 MG disintegrating tablet, Take 1 tablet (4 mg total) by mouth every 8 (eight) hours as needed for nausea or vomiting., Disp: 20 tablet, Rfl: 6   pantoprazole (PROTONIX) 20 MG tablet, Take 20 mg by mouth daily., Disp: , Rfl:    pantoprazole (PROTONIX) 40 MG tablet, Take 40 mg by mouth every morning., Disp: , Rfl:    Polyethyl Glycol-Propyl Glycol (SYSTANE OP), Place 2 drops into both eyes as needed (for dry eyes)., Disp: , Rfl:    Rimegepant Sulfate (NURTEC) 75 MG TBDP, Take 1 tab at onset of migraine.  May repeat in 2 hrs, if needed.  Max dose: 2 tabs/day. This is a 30 day prescription., Disp: 12 tablet, Rfl: 11   tiZANidine (ZANAFLEX) 4 MG tablet, Take 1 tablet (4 mg total) by mouth every 6 (six) hours as needed., Disp: 30 tablet, Rfl: 6   triamcinolone cream (KENALOG) 0.1 %, Apply 1 application topically as needed (for skin irritation)., Disp: , Rfl:    Ubrogepant (UBRELVY) 50 MG TABS, 1 tablet may take second dose at least 2 hours after first dose as needed, Disp: , Rfl:    vitamin B-12 (CYANOCOBALAMIN) 1000 MCG tablet, Take 1,000 mcg by mouth daily., Disp: , Rfl:   I spent 42 minutes dedicated to the care of this patient on the date of this encounter to include pre-visit review of records, face-to-face time with the  patient discussing conditions above, post visit ordering of testing, clinical documentation with the electronic health record, making appropriate referrals as documented, and communicating necessary findings to members of the patients care team.  [name], DO [facility] 04/04/2021 12:13 PM

## 2021-04-04 NOTE — Patient Instructions (Addendum)
Thank you for visiting Dr. Valeta Harms at [facility]. Today we recommend the following:  Orders Placed This Encounter  Procedures   Ambulatory referral to Cardiothoracic Surgery   Pulmonary Function Test   Appt to be scheduled with Dr. Kipp Brood PFTs to be completed ASAP.   Return in about 2 months (around 06/05/2021) for w/ Dr. Valeta Harms.    Please do your part to reduce the spread of COVID-19.

## 2021-04-05 NOTE — Telephone Encounter (Signed)
Pt has been notified of appt date/time for PFT 04/09/21

## 2021-04-09 ENCOUNTER — Other Ambulatory Visit: Payer: Self-pay

## 2021-04-09 ENCOUNTER — Ambulatory Visit (INDEPENDENT_AMBULATORY_CARE_PROVIDER_SITE_OTHER): Payer: MEDICARE | Admitting: Pulmonary Disease

## 2021-04-09 DIAGNOSIS — R911 Solitary pulmonary nodule: Secondary | ICD-10-CM

## 2021-04-09 DIAGNOSIS — Z789 Other specified health status: Secondary | ICD-10-CM

## 2021-04-09 LAB — PULMONARY FUNCTION TEST
DL/VA % pred: 65 %
DL/VA: 2.69 ml/min/mmHg/L
DLCO cor % pred: 71 %
DLCO cor: 13.06 ml/min/mmHg
DLCO unc % pred: 71 %
DLCO unc: 13.06 ml/min/mmHg
FEF 25-75 Post: 2.03 L/sec
FEF 25-75 Pre: 2.18 L/sec
FEF2575-%Change-Post: -6 %
FEF2575-%Pred-Post: 136 %
FEF2575-%Pred-Pre: 146 %
FEV1-%Change-Post: 0 %
FEV1-%Pred-Post: 129 %
FEV1-%Pred-Pre: 130 %
FEV1-Post: 2.5 L
FEV1-Pre: 2.52 L
FEV1FVC-%Change-Post: 4 %
FEV1FVC-%Pred-Pre: 102 %
FEV6-%Change-Post: -4 %
FEV6-%Pred-Post: 128 %
FEV6-%Pred-Pre: 134 %
FEV6-Post: 3.14 L
FEV6-Pre: 3.3 L
FEV6FVC-%Change-Post: 0 %
FEV6FVC-%Pred-Post: 105 %
FEV6FVC-%Pred-Pre: 105 %
FVC-%Change-Post: -4 %
FVC-%Pred-Post: 122 %
FVC-%Pred-Pre: 128 %
FVC-Post: 3.15 L
FVC-Pre: 3.31 L
Post FEV1/FVC ratio: 79 %
Post FEV6/FVC ratio: 100 %
Pre FEV1/FVC ratio: 76 %
Pre FEV6/FVC Ratio: 100 %
RV % pred: 98 %
RV: 2.23 L
TLC % pred: 107 %
TLC: 5.23 L

## 2021-04-09 NOTE — Progress Notes (Deleted)
[address]       [place] [address]             [phone]                    [name] [facility] Medical Record #[id number] Date of Birth: January 17, 1946  Referring: Deland Pretty, MD Primary Care: Deland Pretty, MD Primary Cardiologist: None  Chief Complaint:   No chief complaint on file.   History of Present Illness:    Kendra Taylor 75 y.o. female ***   75 year old female with a past medical history of reflux, basal cell carcinoma, anxiety, rheumatoid arthritis on Remicade infusion.  Here for evaluation of a left lower lobe pulmonary nodule.  In 2018 she was taken for bronchoscopy for a left lower lobe pulmonary nodule.  There was no evidence of malignancy on tissue sampling.February 26, 2017 patient was taken for navigational bronchoscopy by Dr. Lamonte Sakai.  Biopsy of the lesion was negative.  She did not have any additional follow-up and failed to follow-up after the bronchoscopy.  She had a repeat coronary CT scan in May 2022.  This coronary CT scan revealed a 3.6 x 2.4 cm mass within the left lung.  Slightly enlarged from her October 2017 2018 CT.   OV 04/04/2021: Here today for follow-up after recent nuclear medicine pet imaging. Patient had nuclear medicine pet image completed on 03/28/2021.  This revealed low-level metabolic uptake within the near 3 cm part solid left lower lobe lesion.   Smoking Hx: ***   Zubrod Score: At the time of surgery this patient's most appropriate activity status/level should be described as: []     0    Normal activity, no symptoms []     1    Restricted in physical strenuous activity but ambulatory, able to do out light work []     2    Ambulatory and capable of self care, unable to do work activities, up and about               >50 % of waking hours                              []     3    Only limited self care, in bed greater than 50% of waking hours []     4    Completely disabled, no self care, confined to  bed or chair []     5    Moribund   Past Medical History:  Diagnosis Date   Anemia    Anxiety    Arthritis    Atypical mole 01/08/2013   right inner forearm   Basal cell carcinoma 03/12/2017   right concha exc mohs   Depression    Dyspnea    GERD (gastroesophageal reflux disease)    HA (headache)    Migraine    Sleep apnea    cpap    Past Surgical History:  Procedure Laterality Date   ABDOMINAL HYSTERECTOMY     BUNIONECTOMY     both   FINGER ARTHROPLASTY Right 07/30/2012   Procedure: RIGHT INDEX FINGER, MIDDLE FINGER, RING FINGER, SMALL FINGER MCP ARTHROPLASTY WITH REALIGNMENT/REPAIR OF EXTENSOR TENDON;  Surgeon: Roseanne Kaufman, MD;  Location: [facility];  Service: Orthopedics;  Laterality: Right;   FINGER SURGERY     HAMMER TOE SURGERY Left 03/03/2014   Procedure: SECOND THROUGH FIFTH HAMMERTOE CORRECTION;  Surgeon: Wylene Simmer, MD;  Location: [facility];  Service: Orthopedics;  Laterality: Left;   JOINT REPLACEMENT     METATARSAL HEAD EXCISION Left 03/03/2014   Procedure: LEFT SECOND THROUGH FIFTH METATARSAL HEAD EXCISION;  Surgeon: Wylene Simmer, MD;  Location: [facility];  Service: Orthopedics;  Laterality: Left;   OOPHORECTOMY     VIDEO BRONCHOSCOPY Bilateral 12/18/2016   Procedure: VIDEO BRONCHOSCOPY WITH FLUORO;  Surgeon: Tanda Rockers, MD;  Location: [facility];  Service: Cardiopulmonary;  Laterality: Bilateral;   VIDEO BRONCHOSCOPY WITH ENDOBRONCHIAL NAVIGATION Left 02/26/2017   Procedure: VIDEO BRONCHOSCOPY WITH ENDOBRONCHIAL NAVIGATION;  Surgeon: Collene Gobble, MD;  Location: [facility];  Service: Thoracic;  Laterality: Left;    Family History  Problem Relation Age of Onset   Dementia Mother    Sleep apnea Mother    Hyperthyroidism Mother    Dementia Father    Sleep apnea Father      Social History   Tobacco Use  Smoking Status Never  Smokeless Tobacco Never    Social History   Substance and Sexual Activity  Alcohol Use No    Alcohol/week: 0.0 standard drinks     Allergies  Allergen Reactions   Aspirin Other (See Comments)    Ringing in ears with large doses, large doses causes her to lose hearing    Current Outpatient Medications  Medication Sig Dispense Refill   alendronate (FOSAMAX) 70 MG tablet TK 1 T PO ONCE A WEEK FOR 28 DAYS  3   aspirin 81 MG EC tablet 1 tablet     aspirin EC 81 MG EC tablet Take 1 tablet (81 mg total) by mouth daily. Swallow whole. 30 tablet 11   atorvastatin (LIPITOR) 80 MG tablet Take 1 tablet (80 mg total) by mouth daily. 30 tablet 1   atorvastatin (LIPITOR) 80 MG tablet 1 tablet     Cholecalciferol (VITAMIN D) 2000 units tablet Take 2,000 Units by mouth daily.      COVID-19 mRNA bivalent vaccine, Pfizer, (PFIZER COVID-19 VAC BIVALENT) injection Inject into the muscle. 0.3 mL 0   doxycycline (VIBRA-TABS) 100 MG tablet Take 1 po bid x 2 weeks with food 28 tablet 0   fluticasone (FLONASE) 50 MCG/ACT nasal spray SMARTSIG:1-2 Spray(s) Both Nares Daily     folic acid (FOLVITE) 1 MG tablet Take 1 mg by mouth daily.     inFLIXimab (REMICADE) 100 MG injection Inject into the vein every 8 (eight) weeks.      levocetirizine (XYZAL) 5 MG tablet Take 5 mg by mouth daily as needed for allergies.      magnesium gluconate (MAGONATE) 500 MG tablet Take 500 mg by mouth every other day.      methotrexate (RHEUMATREX) 2.5 MG tablet TK 6 TS PO WEEKLY     montelukast (SINGULAIR) 10 MG tablet Take 10 mg by mouth daily.     olopatadine (PATANOL) 0.1 % ophthalmic solution 1 drop 2 (two) times daily.     ondansetron (ZOFRAN ODT) 4 MG disintegrating tablet Take 1 tablet (4 mg total) by mouth every 8 (eight) hours as needed for nausea or vomiting. 20 tablet 6   pantoprazole (PROTONIX) 20 MG tablet Take 20 mg by mouth daily.     pantoprazole (PROTONIX) 40 MG tablet Take 40 mg by mouth every morning.     Polyethyl Glycol-Propyl Glycol (SYSTANE OP) Place 2 drops into both eyes as needed (for dry eyes).      Rimegepant Sulfate (NURTEC) 75  MG TBDP Take 1 tab at onset of migraine.  May repeat in 2 hrs, if needed.  Max dose: 2 tabs/day. This is a 30 day prescription. 12 tablet 11   tiZANidine (ZANAFLEX) 4 MG tablet Take 1 tablet (4 mg total) by mouth every 6 (six) hours as needed. 30 tablet 6   triamcinolone cream (KENALOG) 0.1 % Apply 1 application topically as needed (for skin irritation).     Ubrogepant (UBRELVY) 50 MG TABS 1 tablet may take second dose at least 2 hours after first dose as needed     vitamin B-12 (CYANOCOBALAMIN) 1000 MCG tablet Take 1,000 mcg by mouth daily.     No current facility-administered medications for this visit.    ROS   PHYSICAL EXAMINATION: There were no vitals taken for this visit. Physical Exam  Diagnostic Studies & Laboratory data:     Recent Radiology Findings:   NM PET Image Initial (PI) Skull Base To Thigh (F-18 FDG)  Result Date: 03/29/2021 CLINICAL DATA:  Initial treatment strategy for left lower lobe pulmonary lesion. EXAM: NUCLEAR MEDICINE PET SKULL BASE TO THIGH TECHNIQUE: 5.9 mCi F-18 FDG was injected intravenously. Full-ring PET imaging was performed from the skull base to thigh after the radiotracer. CT data was obtained and used for attenuation correction and anatomic localization. Fasting blood glucose: 89 mg/dl COMPARISON:  Chest CT from 2018 FINDINGS: Mediastinal blood pool activity: SUV max 2.40 Liver activity: SUV max NA NECK: No hypermetabolic lymph nodes in the neck. Incidental CT findings: none CHEST: The irregular partially solid left lower lobe lesion is mildly hypermetabolic with SUV max of 6.28. It may be slightly more nodular when compared to the prior study and repeat bronchoscopic biopsy may be indicated. No other pulmonary lesions are identified. No enlarged or hypermetabolic mediastinal or hilar lymph nodes. There is a 5 mm left axillary node posteriorly which is mildly hypermetabolic with SUV max of 3.66 but this is most likely reactive.  Incidental CT findings: Stable underlying emphysematous changes. No pleural effusions. ABDOMEN/PELVIS: No findings for abdominal/pelvic metastatic disease. No enlarged or hypermetabolic lymph nodes. Incidental CT findings: Minimal vascular calcifications for age. SKELETON: No significant osseous findings. Incidental CT findings: none IMPRESSION: 1. Stable to very slightly larger left lower lobe lung lesion demonstrating mild hypermetabolism. Recommend either follow-up CT imaging in 3-6 months or possibly repeat endoscopic biopsy. 2. No other significant findings. Electronically Signed   By: Marijo Sanes M.D.   On: 03/29/2021 17:03       I have independently reviewed the above radiology studies  and reviewed the findings with the patient.   Recent Lab Findings: Lab Results  Component Value Date   WBC 10.6 (H) 12/26/2020   WBC 10.5 12/26/2020   HGB 11.5 (L) 12/26/2020   HGB 11.6 (L) 12/26/2020   HCT 34.7 (L) 12/26/2020   HCT 34.9 (L) 12/26/2020   PLT 247 12/26/2020   PLT 259 12/26/2020   GLUCOSE 89 12/26/2020   CHOL 182 12/27/2020   TRIG 83 12/27/2020   HDL 55 12/27/2020   LDLCALC 110 (H) 12/27/2020   NA 137 12/26/2020   K 3.9 12/26/2020   CL 99 12/26/2020   CREATININE 0.81 12/26/2020   BUN 18 12/26/2020   CO2 30 12/26/2020   HGBA1C 5.3 12/27/2020     PFTs:  - FVC: 114% - FEV1: 126% -DLCO: 79%  Problem List: 3.6cm LLL pulm mass.  SUV 3.     CHEST: The irregular partially solid left lower lobe lesion is  mildly hypermetabolic with SUV max of 6.00. It may be slightly more nodular when compared to the prior study and repeat bronchoscopic biopsy may be indicated. No other pulmonary lesions are identified. No enlarged or hypermetabolic mediastinal or hilar lymph nodes.   There is a 5 mm left axillary node posteriorly which is mildly hypermetabolic with SUV max of 4.59 but this is most likely reactive.  Assessment / Plan:   75 yo female with LLL pulmonary mass.  MRI  brain from August negative.  Will check for neurologic symptoms, and consider repeat.  Rec: combo case with Dr. Valeta Harms.  Will need stress test     I  spent {CHL ONC TIME VISIT - XHFSF:[id number]} with  the patient face to face in counseling and coordination of care.    Lajuana Matte 04/09/2021 7:39 AM

## 2021-04-09 NOTE — Progress Notes (Signed)
PFT done today. 

## 2021-04-10 ENCOUNTER — Encounter: Payer: MEDICARE | Admitting: Thoracic Surgery (Cardiothoracic Vascular Surgery)

## 2021-04-11 ENCOUNTER — Institutional Professional Consult (permissible substitution) (INDEPENDENT_AMBULATORY_CARE_PROVIDER_SITE_OTHER): Payer: MEDICARE | Admitting: Thoracic Surgery (Cardiothoracic Vascular Surgery)

## 2021-04-11 ENCOUNTER — Other Ambulatory Visit: Payer: MEDICARE

## 2021-04-11 ENCOUNTER — Other Ambulatory Visit: Payer: Self-pay

## 2021-04-11 ENCOUNTER — Telehealth: Payer: Self-pay | Admitting: *Deleted

## 2021-04-11 VITALS — BP 169/77 | HR 81 | Resp 20 | Ht 63.0 in | Wt 118.0 lb

## 2021-04-11 DIAGNOSIS — R911 Solitary pulmonary nodule: Secondary | ICD-10-CM

## 2021-04-11 NOTE — Telephone Encounter (Signed)
PA for Nurtec started on covermymeds (key: [id number]). Pt has coverage with [employer] (IT#[id number]). Decision pending.

## 2021-04-11 NOTE — Telephone Encounter (Signed)
[id number] approved through 04/28/2022.

## 2021-04-11 NOTE — Progress Notes (Signed)
[address]       ,[place] [address]             [phone]                    Kendra Taylor [facility] Medical Record #[id number] Date of Birth: [birth year]  Referring: Kendra Nash, DO Primary Care: Kendra Pretty, MD Primary Cardiologist: None  Chief Complaint:    Chief Complaint  Patient presents with   Lung Lesion    Surgical consult, PET Scan 03/28/21, PFT's 04/09/21    History of Present Illness:    Kendra Taylor 75 y.o. female referred by Dr. Valeta Taylor for surgical evaluation of a 3.6 cm left lower lobe pulmonary mass.  She was originally seen in 2017 for small nodule that was biopsied via navigational bronchoscopy with results were negative.  She subsequently was lost to follow-up before he presents now with an enlarging mass.  She has a history of rheumatoid arthritis and has been on Remicade and methotrexate.  She also had a TIA in the summer of this year, but has no residual deficits.  She has a history of migraines.  She denies any shortness of breath.     Smoking Hx: Lifelong non-smoker.   Zubrod Score: At the time of surgery this patients most appropriate activity status/level should be described as: [x]     0    Normal activity, no symptoms []     1    Restricted in physical strenuous activity but ambulatory, able to do out light work []     2    Ambulatory and capable of self care, unable to do work activities, up and about               >50 % of waking hours                              []     3    Only limited self care, in bed greater than 50% of waking hours []     4    Completely disabled, no self care, confined to bed or chair []     5    Moribund   Past Medical History:  Diagnosis Date   Anemia    Anxiety    Arthritis    Atypical mole 01/08/2013   right inner forearm   Basal cell carcinoma 03/12/2017   right concha exc mohs   Depression    Dyspnea    GERD (gastroesophageal reflux disease)    HA  (headache)    Migraine    Sleep apnea    cpap    Past Surgical History:  Procedure Laterality Date   ABDOMINAL HYSTERECTOMY     BUNIONECTOMY     both   FINGER ARTHROPLASTY Right 07/30/2012   Procedure: RIGHT INDEX FINGER, MIDDLE FINGER, RING FINGER, SMALL FINGER MCP ARTHROPLASTY WITH REALIGNMENT/REPAIR OF EXTENSOR TENDON;  Surgeon: Roseanne Kaufman, MD;  Location: [facility];  Service: Orthopedics;  Laterality: Right;   FINGER SURGERY     HAMMER TOE SURGERY Left 03/03/2014   Procedure: SECOND THROUGH FIFTH HAMMERTOE CORRECTION;  Surgeon: Wylene Simmer, MD;  Location: [facility];  Service: Orthopedics;  Laterality: Left;   JOINT REPLACEMENT     METATARSAL HEAD EXCISION Left 03/03/2014   Procedure: LEFT SECOND THROUGH FIFTH METATARSAL HEAD EXCISION;  Surgeon: Wylene Simmer, MD;  Location: [facility];  Service: Orthopedics;  Laterality: Left;   OOPHORECTOMY     VIDEO BRONCHOSCOPY Bilateral 12/18/2016   Procedure: VIDEO BRONCHOSCOPY WITH FLUORO;  Surgeon: Tanda Rockers, MD;  Location: [facility];  Service: Cardiopulmonary;  Laterality: Bilateral;   VIDEO BRONCHOSCOPY WITH ENDOBRONCHIAL NAVIGATION Left 02/26/2017   Procedure: VIDEO BRONCHOSCOPY WITH ENDOBRONCHIAL NAVIGATION;  Surgeon: Collene Gobble, MD;  [facility];  Service: Thoracic;  Laterality: Left;    Family History  Problem Relation Age of Onset   Dementia Mother    Sleep apnea Mother    Hyperthyroidism Mother    Dementia Father    Sleep apnea Father      Social History   Tobacco Use  Smoking Status Never  Smokeless Tobacco Never    Social History   Substance and Sexual Activity  Alcohol Use No   Alcohol/week: 0.0 standard drinks     Allergies  Allergen Reactions   Aspirin Other (See Comments)    Ringing in ears with large doses, large doses causes her to lose hearing    Current Outpatient Medications  Medication Sig Dispense Refill   alendronate (FOSAMAX) 70 MG tablet TK 1 T PO  ONCE A WEEK FOR 28 DAYS  3   aspirin 81 MG EC tablet 1 tablet     aspirin EC 81 MG EC tablet Take 1 tablet (81 mg total) by mouth daily. Swallow whole. 30 tablet 11   atorvastatin (LIPITOR) 80 MG tablet Take 1 tablet (80 mg total) by mouth daily. 30 tablet 1   atorvastatin (LIPITOR) 80 MG tablet 1 tablet     Cholecalciferol (VITAMIN D) 2000 units tablet Take 2,000 Units by mouth daily.      COVID-19 mRNA bivalent vaccine, Pfizer, (PFIZER COVID-19 VAC BIVALENT) injection Inject into the muscle. 0.3 mL 0   doxycycline (VIBRA-TABS) 100 MG tablet Take 1 po bid x 2 weeks with food 28 tablet 0   fluticasone (FLONASE) 50 MCG/ACT nasal spray SMARTSIG:1-2 Spray(s) Both Nares Daily     folic acid (FOLVITE) 1 MG tablet Take 1 mg by mouth daily.     inFLIXimab (REMICADE) 100 MG injection Inject into the vein every 8 (eight) weeks.      levocetirizine (XYZAL) 5 MG tablet Take 5 mg by mouth daily as needed for allergies.      magnesium gluconate (MAGONATE) 500 MG tablet Take 500 mg by mouth every other day.      methotrexate (RHEUMATREX) 2.5 MG tablet TK 6 TS PO WEEKLY     montelukast (SINGULAIR) 10 MG tablet Take 10 mg by mouth daily.     olopatadine (PATANOL) 0.1 % ophthalmic solution 1 drop 2 (two) times daily.     ondansetron (ZOFRAN ODT) 4 MG disintegrating tablet Take 1 tablet (4 mg total) by mouth every 8 (eight) hours as needed for nausea or vomiting. 20 tablet 6   pantoprazole (PROTONIX) 20 MG tablet Take 20 mg by mouth daily.     pantoprazole (PROTONIX) 40 MG tablet Take 40 mg by mouth every morning.     Polyethyl Glycol-Propyl Glycol (SYSTANE OP) Place 2 drops into both eyes as needed (for dry eyes).     Rimegepant Sulfate (NURTEC) 75 MG TBDP Take 1 tab at onset of migraine.  May repeat in 2 hrs, if needed.  Max dose: 2 tabs/day. This is a 30 day prescription. 12 tablet 11   tiZANidine (ZANAFLEX) 4 MG tablet Take 1 tablet (4 mg total) by mouth every 6 (six)  hours as needed. 30 tablet 6    triamcinolone cream (KENALOG) 0.1 % Apply 1 application topically as needed (for skin irritation).     Ubrogepant (UBRELVY) 50 MG TABS 1 tablet may take second dose at least 2 hours after first dose as needed     vitamin B-12 (CYANOCOBALAMIN) 1000 MCG tablet Take 1,000 mcg by mouth daily.     No current facility-administered medications for this visit.    Review of Systems  Constitutional:  Positive for weight loss. Negative for malaise/fatigue.  Respiratory:  Negative for cough and shortness of breath.   Cardiovascular:  Positive for leg swelling. Negative for chest pain.  Musculoskeletal:  Positive for joint pain.  Neurological: Negative.     PHYSICAL EXAMINATION: BP (!) 169/77 (BP Location: Right Arm, Patient Position: Sitting)    Pulse 81    Resp 20    Ht 5\' 3"  (1.6 m)    Wt 118 lb (53.5 kg)    SpO2 99% Comment: RA   BMI 20.90 kg/m  Physical Exam Constitutional:      General: She is not in acute distress.    Appearance: Normal appearance. She is not ill-appearing.  HENT:     Head: Normocephalic and atraumatic.  Eyes:     Extraocular Movements: Extraocular movements intact.  Cardiovascular:     Rate and Rhythm: Normal rate.  Pulmonary:     Effort: Pulmonary effort is normal. No respiratory distress.  Abdominal:     General: Abdomen is flat. There is no distension.  Musculoskeletal:        General: Normal range of motion.     Cervical back: Normal range of motion.  Skin:    General: Skin is warm and dry.  Neurological:     General: No focal deficit present.     Mental Status: She is alert and oriented to person, place, and time.    Diagnostic Studies & Laboratory data:     Recent Radiology Findings:   NM PET Image Initial (PI) Skull Base To Thigh (F-18 FDG)  Result Date: 03/29/2021 CLINICAL DATA:  Initial treatment strategy for left lower lobe pulmonary lesion. EXAM: NUCLEAR MEDICINE PET SKULL BASE TO THIGH TECHNIQUE: 5.9 mCi F-18 FDG was injected intravenously.  Full-ring PET imaging was performed from the skull base to thigh after the radiotracer. CT data was obtained and used for attenuation correction and anatomic localization. Fasting blood glucose: 89 mg/dl COMPARISON:  Chest CT from 2018 FINDINGS: Mediastinal blood pool activity: SUV max 2.40 Liver activity: SUV max NA NECK: No hypermetabolic lymph nodes in the neck. Incidental CT findings: none CHEST: The irregular partially solid left lower lobe lesion is mildly hypermetabolic with SUV max of 6.07. It may be slightly more nodular when compared to the prior study and repeat bronchoscopic biopsy may be indicated. No other pulmonary lesions are identified. No enlarged or hypermetabolic mediastinal or hilar lymph nodes. There is a 5 mm left axillary node posteriorly which is mildly hypermetabolic with SUV max of 3.71 but this is most likely reactive. Incidental CT findings: Stable underlying emphysematous changes. No pleural effusions. ABDOMEN/PELVIS: No findings for abdominal/pelvic metastatic disease. No enlarged or hypermetabolic lymph nodes. Incidental CT findings: Minimal vascular calcifications for age. SKELETON: No significant osseous findings. Incidental CT findings: none IMPRESSION: 1. Stable to very slightly larger left lower lobe lung lesion demonstrating mild hypermetabolism. Recommend either follow-up CT imaging in 3-6 months or possibly repeat endoscopic biopsy. 2. No other significant findings. Electronically Signed   By:  Marijo Sanes M.D.   On: 03/29/2021 17:03       I have independently reviewed the above radiology studies  and reviewed the findings with the patient.   Recent Lab Findings: Lab Results  Component Value Date   WBC 10.6 (H) 12/26/2020   WBC 10.5 12/26/2020   HGB 11.5 (L) 12/26/2020   HGB 11.6 (L) 12/26/2020   HCT 34.7 (L) 12/26/2020   HCT 34.9 (L) 12/26/2020   PLT 247 12/26/2020   PLT 259 12/26/2020   GLUCOSE 89 12/26/2020   CHOL 182 12/27/2020   TRIG 83 12/27/2020    HDL 55 12/27/2020   LDLCALC 110 (H) 12/27/2020   NA 137 12/26/2020   K 3.9 12/26/2020   CL 99 12/26/2020   CREATININE 0.81 12/26/2020   BUN 18 12/26/2020   CO2 30 12/26/2020   HGBA1C 5.3 12/27/2020     PFTs:  - FVC: 128% - FEV1: 130% -DLCO: 71%  Problem List: 3.6 cm left lower lobe pulmonary mass.  SUV uptake of 2.96.    Assessment / Plan:   75 year old female with 3.6cm left lower lobe pulmonary mass concerning for primary lung cancer.  Acceptable pulmonary function testing.  We discussed several options including CT-guided biopsy versus navigational bronchoscopy to obtain a tissue diagnosis.  She ultimately decided to undergo a combination procedure which will include a navigational bronchoscopy performed by Dr. Valeta Taylor followed by a left robotic assisted thoracoscopy with left lower lobe wedge resection and possible lobectomy.  She would like of this procedure is to take place in January 2023.      I  spent 55 minutes with  the patient face to face in counseling and coordination of care.    Lajuana Matte 04/11/2021 5:09 PM

## 2021-04-12 ENCOUNTER — Telehealth: Payer: Self-pay | Admitting: Pulmonary Disease

## 2021-04-12 ENCOUNTER — Other Ambulatory Visit: Payer: Self-pay | Admitting: *Deleted

## 2021-04-12 ENCOUNTER — Encounter: Payer: Self-pay | Admitting: *Deleted

## 2021-04-12 DIAGNOSIS — Z5181 Encounter for therapeutic drug level monitoring: Secondary | ICD-10-CM

## 2021-04-12 DIAGNOSIS — R911 Solitary pulmonary nodule: Secondary | ICD-10-CM

## 2021-04-12 NOTE — Telephone Encounter (Signed)
PCCM:  Orders placed for combo case with Dr. Kipp Brood.   05/24/2020 as second case  [name], DO [facility] 04/12/2021 5:44 PM

## 2021-04-13 ENCOUNTER — Encounter: Payer: Self-pay | Admitting: Pulmonary Disease

## 2021-04-13 ENCOUNTER — Encounter: Payer: MEDICARE | Admitting: Thoracic Surgery (Cardiothoracic Vascular Surgery)

## 2021-04-16 ENCOUNTER — Telehealth: Payer: Self-pay | Admitting: *Deleted

## 2021-04-16 ENCOUNTER — Telehealth: Payer: Self-pay | Admitting: Pulmonary Disease

## 2021-04-16 NOTE — Telephone Encounter (Signed)
Patient contacted the office stating she has an infusion of Remicaid scheduled on 1/15 for arthritis. Per Dr. Kipp Brood, patient advised she may receive the infusion prior to surgery scheduled 1/26. Patient acknowledges receipt.

## 2021-04-17 NOTE — Telephone Encounter (Signed)
Call returned to patient, confirmed DOB. Patient is due to get a Remicade infusion two weeks before the surgery and she wanted to be sure it was okay to still get the remicade infusion and methotrexate since she is scheduled for surgery with BI on January 26th. Her rheumatologist suggested she skip the January infusion since she is having surgery. She wanted to get BI's opinion.   BI please advise. Thanks :)

## 2021-04-17 NOTE — Telephone Encounter (Signed)
Called and spoke with patient to let her know of recs from Dr. Valeta Harms and for her to not get infusion until after surgery but continue methotrexate. She expressed understanding. Nothing further needed at this time.

## 2021-05-02 DIAGNOSIS — D649 Anemia, unspecified: Secondary | ICD-10-CM | POA: Diagnosis not present

## 2021-05-02 DIAGNOSIS — R809 Proteinuria, unspecified: Secondary | ICD-10-CM | POA: Diagnosis not present

## 2021-05-02 DIAGNOSIS — R310 Gross hematuria: Secondary | ICD-10-CM | POA: Diagnosis not present

## 2021-05-02 DIAGNOSIS — M545 Low back pain, unspecified: Secondary | ICD-10-CM | POA: Diagnosis not present

## 2021-05-03 ENCOUNTER — Other Ambulatory Visit: Payer: Self-pay

## 2021-05-03 DIAGNOSIS — M545 Low back pain, unspecified: Secondary | ICD-10-CM

## 2021-05-03 DIAGNOSIS — R310 Gross hematuria: Secondary | ICD-10-CM

## 2021-05-03 DIAGNOSIS — R809 Proteinuria, unspecified: Secondary | ICD-10-CM

## 2021-05-04 DIAGNOSIS — N200 Calculus of kidney: Secondary | ICD-10-CM | POA: Diagnosis not present

## 2021-05-04 DIAGNOSIS — K5900 Constipation, unspecified: Secondary | ICD-10-CM | POA: Diagnosis not present

## 2021-05-04 DIAGNOSIS — N281 Cyst of kidney, acquired: Secondary | ICD-10-CM | POA: Diagnosis not present

## 2021-05-09 DIAGNOSIS — R310 Gross hematuria: Secondary | ICD-10-CM | POA: Diagnosis not present

## 2021-05-09 DIAGNOSIS — N281 Cyst of kidney, acquired: Secondary | ICD-10-CM | POA: Diagnosis not present

## 2021-05-09 DIAGNOSIS — N200 Calculus of kidney: Secondary | ICD-10-CM | POA: Diagnosis not present

## 2021-05-11 ENCOUNTER — Ambulatory Visit (HOSPITAL_COMMUNITY)
Admission: RE | Admit: 2021-05-11 | Discharge: 2021-05-11 | Disposition: A | Discharge status: Home / Self Care | Payer: MEDICARE | Source: Ambulatory Visit | Attending: Pulmonary Disease | Admitting: Pulmonary Disease

## 2021-05-11 ENCOUNTER — Other Ambulatory Visit: Payer: MEDICARE

## 2021-05-11 ENCOUNTER — Other Ambulatory Visit: Payer: Self-pay

## 2021-05-11 DIAGNOSIS — R918 Other nonspecific abnormal finding of lung field: Secondary | ICD-10-CM | POA: Diagnosis not present

## 2021-05-11 DIAGNOSIS — J479 Bronchiectasis, uncomplicated: Secondary | ICD-10-CM | POA: Diagnosis not present

## 2021-05-11 DIAGNOSIS — R911 Solitary pulmonary nodule: Secondary | ICD-10-CM | POA: Diagnosis not present

## 2021-05-14 DIAGNOSIS — D649 Anemia, unspecified: Secondary | ICD-10-CM | POA: Diagnosis not present

## 2021-05-14 DIAGNOSIS — Z01818 Encounter for other preprocedural examination: Secondary | ICD-10-CM | POA: Diagnosis not present

## 2021-05-14 DIAGNOSIS — R600 Localized edema: Secondary | ICD-10-CM | POA: Diagnosis not present

## 2021-05-14 DIAGNOSIS — R319 Hematuria, unspecified: Secondary | ICD-10-CM | POA: Diagnosis not present

## 2021-05-15 DIAGNOSIS — R319 Hematuria, unspecified: Secondary | ICD-10-CM | POA: Diagnosis not present

## 2021-05-15 DIAGNOSIS — R310 Gross hematuria: Secondary | ICD-10-CM | POA: Diagnosis not present

## 2021-05-16 DIAGNOSIS — D649 Anemia, unspecified: Secondary | ICD-10-CM | POA: Diagnosis not present

## 2021-05-21 NOTE — Pre-Procedure Instructions (Addendum)
Surgical Instructions    Your procedure is scheduled on Thursday 05/24/21.   Report to [facility] Main Entrance "A" at 09:45 A.M., then check in with the Admitting office.  Call this number if you have problems the morning of surgery:  [phone]   If you have any questions prior to your surgery date call [phone]: Open Monday-Friday 8am-4pm    Remember:  Do not eat or drink after midnight the night before your surgery    Take these medicines the morning of surgery with A SIP OF WATER   atorvastatin (LIPITOR)   pantoprazole (PROTONIX)    Take these medicines if needed:   acetaminophen (TYLENOL)   fluticasone (FLONASE)  olopatadine (PATANOL)  ondansetron (ZOFRAN ODT)   Polyethyl Glycol-Propyl Glycol (SYSTANE OP)   As of today, STOP taking any Aspirin (unless otherwise instructed by your surgeon) Aleve, Naproxen, Ibuprofen, Motrin, Advil, Goody's, BC's, all herbal medications, fish oil, and all vitamins.  Please follow your surgeon's instructions regarding methotrexate ([employer]). If you have not received instructions then please contact your surgeon's office for instructions.  After your COVID test   You are not required to quarantine however you are required to wear a well-fitting mask when you are out and around people not in your household.  If your mask becomes wet or soiled, replace with a new one.  Wash your hands often with soap and water for 20 seconds or clean your hands with an alcohol-based hand sanitizer that contains at least 60% alcohol.  Do not share personal items.  Notify your provider: if you are in close contact with someone who has COVID  or if you develop a fever of 100.4 or greater, sneezing, cough, sore throat, shortness of breath or body aches.           Do not wear jewelry or makeup Do not wear lotions, powders, perfumes/colognes, or deodorant. Do not shave 48 hours prior to surgery.  Men may shave face and neck. Do not bring valuables to  the hospital. DO Not wear nail polish, gel polish, artificial nails, or any other type of covering on natural nails (fingers and toes) If you have artificial nails or gel coating that need to be removed by a nail salon, please have this removed prior to surgery. Artificial nails or gel coating may interfere with anesthesia's ability to adequately monitor your vital signs.             [facility] is not responsible for any belongings or valuables.  Do NOT Smoke (Tobacco/Vaping)  24 hours prior to your procedure  If you use a CPAP at night, you may bring your mask for your overnight stay.   Contacts, glasses, hearing aids, dentures or partials may not be worn into surgery, please bring cases for these belongings   For patients admitted to the hospital, discharge time will be determined by your treatment team.   Patients discharged the day of surgery will not be allowed to drive home, and someone needs to stay with them for 24 hours.  NO VISITORS WILL BE ALLOWED IN PRE-OP WHERE PATIENTS ARE PREPPED FOR SURGERY.  ONLY 1 SUPPORT PERSON MAY BE PRESENT IN THE WAITING ROOM WHILE YOU ARE IN SURGERY.  IF YOU ARE TO BE ADMITTED, ONCE YOU ARE IN YOUR ROOM YOU WILL BE ALLOWED TWO (2) VISITORS. 1 (ONE) VISITOR MAY STAY OVERNIGHT BUT MUST ARRIVE TO THE ROOM BY 8pm.  Minor children may have two parents present. Special consideration for safety and  communication needs will be reviewed on a case by case basis.  Special instructions:    Oral Hygiene is also important to reduce your risk of infection.  Remember - BRUSH YOUR TEETH THE MORNING OF SURGERY WITH YOUR REGULAR TOOTHPASTE   [facility]- Preparing For Surgery  Before surgery, you can play an important role. Because skin is not sterile, your skin needs to be as free of germs as possible. You can reduce the number of germs on your skin by washing with CHG (chlorahexidine gluconate) Soap before surgery.  CHG is an antiseptic cleaner which kills germs and  bonds with the skin to continue killing germs even after washing.     Please do not use if you have an allergy to CHG or antibacterial soaps. If your skin becomes reddened/irritated stop using the CHG.  Do not shave (including legs and underarms) for at least 48 hours prior to first CHG shower. It is OK to shave your face.  Please follow these instructions carefully.     Shower the NIGHT BEFORE SURGERY and the MORNING OF SURGERY with CHG Soap.   If you chose to wash your hair, wash your hair first as usual with your normal shampoo. After you shampoo, rinse your hair and body thoroughly to remove the shampoo.  Then [employer] and genitals (private parts) with your normal soap and rinse thoroughly to remove soap.  After that Use CHG Soap as you would any other liquid soap. You can apply CHG directly to the skin and wash gently with a scrungie or a clean washcloth.   Apply the CHG Soap to your body ONLY FROM THE NECK DOWN.  Do not use on open wounds or open sores. Avoid contact with your eyes, ears, mouth and genitals (private parts). Wash Face and genitals (private parts)  with your normal soap.   Wash thoroughly, paying special attention to the area where your surgery will be performed.  Thoroughly rinse your body with warm water from the neck down.  DO NOT shower/wash with your normal soap after using and rinsing off the CHG Soap.  Pat yourself dry with a CLEAN TOWEL.  Wear CLEAN PAJAMAS to bed the night before surgery  Place CLEAN SHEETS on your bed the night before your surgery  DO NOT SLEEP WITH PETS.   Day of Surgery:  Take a shower with CHG soap. Wear Clean/Comfortable clothing the morning of surgery Do not apply any deodorants/lotions.   Remember to brush your teeth WITH YOUR REGULAR TOOTHPASTE.   Please read over the following fact sheets that you were given.

## 2021-05-22 ENCOUNTER — Ambulatory Visit (HOSPITAL_COMMUNITY)
Admission: RE | Admit: 2021-05-22 | Discharge: 2021-05-22 | Disposition: A | Discharge status: Home / Self Care | Payer: MEDICARE | Source: Ambulatory Visit | Attending: Thoracic Surgery (Cardiothoracic Vascular Surgery) | Admitting: Thoracic Surgery (Cardiothoracic Vascular Surgery)

## 2021-05-22 ENCOUNTER — Encounter (HOSPITAL_COMMUNITY): Payer: Self-pay

## 2021-05-22 ENCOUNTER — Encounter (HOSPITAL_COMMUNITY)
Admission: RE | Admit: 2021-05-22 | Discharge: 2021-05-22 | Disposition: A | Discharge status: Home / Self Care | Payer: MEDICARE | Source: Ambulatory Visit | Attending: Pulmonary Disease | Admitting: Pulmonary Disease

## 2021-05-22 ENCOUNTER — Other Ambulatory Visit: Payer: Self-pay

## 2021-05-22 VITALS — BP 160/91 | HR 95 | Temp 97.8°F | Resp 17 | Ht 62.0 in | Wt 112.5 lb

## 2021-05-22 DIAGNOSIS — G4730 Sleep apnea, unspecified: Secondary | ICD-10-CM | POA: Diagnosis present

## 2021-05-22 DIAGNOSIS — R110 Nausea: Secondary | ICD-10-CM | POA: Diagnosis not present

## 2021-05-22 DIAGNOSIS — Z20822 Contact with and (suspected) exposure to covid-19: Secondary | ICD-10-CM | POA: Diagnosis present

## 2021-05-22 DIAGNOSIS — Z01818 Encounter for other preprocedural examination: Secondary | ICD-10-CM | POA: Insufficient documentation

## 2021-05-22 DIAGNOSIS — Z7982 Long term (current) use of aspirin: Secondary | ICD-10-CM | POA: Diagnosis not present

## 2021-05-22 DIAGNOSIS — Z886 Allergy status to analgesic agent status: Secondary | ICD-10-CM | POA: Diagnosis not present

## 2021-05-22 DIAGNOSIS — G43909 Migraine, unspecified, not intractable, without status migrainosus: Secondary | ICD-10-CM | POA: Diagnosis present

## 2021-05-22 DIAGNOSIS — I700 Atherosclerosis of aorta: Secondary | ICD-10-CM | POA: Insufficient documentation

## 2021-05-22 DIAGNOSIS — D62 Acute posthemorrhagic anemia: Secondary | ICD-10-CM | POA: Diagnosis not present

## 2021-05-22 DIAGNOSIS — Z5181 Encounter for therapeutic drug level monitoring: Secondary | ICD-10-CM | POA: Insufficient documentation

## 2021-05-22 DIAGNOSIS — R591 Generalized enlarged lymph nodes: Secondary | ICD-10-CM | POA: Diagnosis present

## 2021-05-22 DIAGNOSIS — M069 Rheumatoid arthritis, unspecified: Secondary | ICD-10-CM | POA: Diagnosis not present

## 2021-05-22 DIAGNOSIS — Z90710 Acquired absence of both cervix and uterus: Secondary | ICD-10-CM | POA: Diagnosis not present

## 2021-05-22 DIAGNOSIS — J439 Emphysema, unspecified: Secondary | ICD-10-CM | POA: Diagnosis not present

## 2021-05-22 DIAGNOSIS — M0609 Rheumatoid arthritis without rheumatoid factor, multiple sites: Secondary | ICD-10-CM | POA: Diagnosis present

## 2021-05-22 DIAGNOSIS — K660 Peritoneal adhesions (postprocedural) (postinfection): Secondary | ICD-10-CM | POA: Diagnosis not present

## 2021-05-22 DIAGNOSIS — C3432 Malignant neoplasm of lower lobe, left bronchus or lung: Secondary | ICD-10-CM | POA: Diagnosis not present

## 2021-05-22 DIAGNOSIS — R911 Solitary pulmonary nodule: Secondary | ICD-10-CM | POA: Diagnosis not present

## 2021-05-22 DIAGNOSIS — M1990 Unspecified osteoarthritis, unspecified site: Secondary | ICD-10-CM | POA: Diagnosis not present

## 2021-05-22 DIAGNOSIS — Z79620 Long term (current) use of immunosuppressive biologic: Secondary | ICD-10-CM | POA: Diagnosis not present

## 2021-05-22 DIAGNOSIS — F418 Other specified anxiety disorders: Secondary | ICD-10-CM | POA: Diagnosis not present

## 2021-05-22 DIAGNOSIS — I2510 Atherosclerotic heart disease of native coronary artery without angina pectoris: Secondary | ICD-10-CM | POA: Insufficient documentation

## 2021-05-22 DIAGNOSIS — J9811 Atelectasis: Secondary | ICD-10-CM | POA: Diagnosis not present

## 2021-05-22 DIAGNOSIS — R918 Other nonspecific abnormal finding of lung field: Secondary | ICD-10-CM | POA: Diagnosis not present

## 2021-05-22 DIAGNOSIS — J479 Bronchiectasis, uncomplicated: Secondary | ICD-10-CM | POA: Insufficient documentation

## 2021-05-22 DIAGNOSIS — M0589 Other rheumatoid arthritis with rheumatoid factor of multiple sites: Secondary | ICD-10-CM | POA: Diagnosis not present

## 2021-05-22 DIAGNOSIS — Z79899 Other long term (current) drug therapy: Secondary | ICD-10-CM | POA: Insufficient documentation

## 2021-05-22 DIAGNOSIS — Z85828 Personal history of other malignant neoplasm of skin: Secondary | ICD-10-CM | POA: Diagnosis not present

## 2021-05-22 DIAGNOSIS — Z888 Allergy status to other drugs, medicaments and biological substances status: Secondary | ICD-10-CM | POA: Diagnosis not present

## 2021-05-22 DIAGNOSIS — I310 Chronic adhesive pericarditis: Secondary | ICD-10-CM | POA: Diagnosis not present

## 2021-05-22 DIAGNOSIS — J939 Pneumothorax, unspecified: Secondary | ICD-10-CM | POA: Diagnosis not present

## 2021-05-22 DIAGNOSIS — Z8673 Personal history of transient ischemic attack (TIA), and cerebral infarction without residual deficits: Secondary | ICD-10-CM | POA: Diagnosis not present

## 2021-05-22 DIAGNOSIS — I10 Essential (primary) hypertension: Secondary | ICD-10-CM | POA: Diagnosis present

## 2021-05-22 DIAGNOSIS — K219 Gastro-esophageal reflux disease without esophagitis: Secondary | ICD-10-CM | POA: Diagnosis not present

## 2021-05-22 DIAGNOSIS — J90 Pleural effusion, not elsewhere classified: Secondary | ICD-10-CM | POA: Diagnosis not present

## 2021-05-22 DIAGNOSIS — N179 Acute kidney failure, unspecified: Secondary | ICD-10-CM | POA: Diagnosis not present

## 2021-05-22 LAB — CBC
HCT: 24 % — ABNORMAL LOW (ref 36.0–46.0)
Hemoglobin: 8 g/dL — ABNORMAL LOW (ref 12.0–15.0)
MCH: 28.6 pg (ref 26.0–34.0)
MCHC: 33.3 g/dL (ref 30.0–36.0)
MCV: 85.7 fL (ref 80.0–100.0)
Platelets: 255 10*3/uL (ref 150–400)
RBC: 2.8 MIL/uL — ABNORMAL LOW (ref 3.87–5.11)
RDW: 12.6 % (ref 11.5–15.5)
WBC: 7.5 10*3/uL (ref 4.0–10.5)
nRBC: 0 % (ref 0.0–0.2)

## 2021-05-22 LAB — URINALYSIS, ROUTINE W REFLEX MICROSCOPIC
Bilirubin Urine: NEGATIVE
Glucose, UA: NEGATIVE mg/dL
Ketones, ur: NEGATIVE mg/dL
Nitrite: NEGATIVE
Protein, ur: 100 mg/dL — AB
RBC / HPF: >50 RBC/hpf — ABNORMAL HIGH (ref 0–5)
Specific Gravity, Urine: 1.011 (ref 1.005–1.030)
pH: 5 (ref 5.0–8.0)

## 2021-05-22 LAB — SARS CORONAVIRUS 2 (TAT 6-24 HRS): SARS Coronavirus 2: NEGATIVE

## 2021-05-22 LAB — COMPREHENSIVE METABOLIC PANEL
ALT: 31 U/L (ref 0–44)
AST: 27 U/L (ref 15–41)
Albumin: 2.9 g/dL — ABNORMAL LOW (ref 3.5–5.0)
Alkaline Phosphatase: 56 U/L (ref 38–126)
Anion gap: 10 (ref 5–15)
BUN: 23 mg/dL (ref 8–23)
CO2: 25 mmol/L (ref 22–32)
Calcium: 8.7 mg/dL — ABNORMAL LOW (ref 8.9–10.3)
Chloride: 96 mmol/L — ABNORMAL LOW (ref 98–111)
Creatinine, Ser: 1.25 mg/dL — ABNORMAL HIGH (ref 0.44–1.00)
GFR, Estimated: 44 mL/min — ABNORMAL LOW (ref >60)
Glucose, Bld: 92 mg/dL (ref 70–99)
Potassium: 3.6 mmol/L (ref 3.5–5.1)
Sodium: 131 mmol/L — ABNORMAL LOW (ref 135–145)
Total Bilirubin: 0.4 mg/dL (ref 0.3–1.2)
Total Protein: 5.3 g/dL — ABNORMAL LOW (ref 6.5–8.1)

## 2021-05-22 LAB — APTT: aPTT: 37 seconds — ABNORMAL HIGH (ref 24–36)

## 2021-05-22 LAB — SURGICAL PCR SCREEN
MRSA, PCR: NEGATIVE
Staphylococcus aureus: NEGATIVE

## 2021-05-22 LAB — PROTIME-INR
INR: 1 (ref 0.8–1.2)
Prothrombin Time: 13.3 seconds (ref 11.4–15.2)

## 2021-05-22 LAB — BLOOD GAS, ARTERIAL
Acid-Base Excess: 3.3 mmol/L — ABNORMAL HIGH (ref 0.0–2.0)
Bicarbonate: 27.1 mmol/L (ref 20.0–28.0)
Drawn by: 60286
FIO2: 21
O2 Saturation: 98.8 %
Patient temperature: 37
pCO2 arterial: 40.2 mmHg (ref 32.0–48.0)
pH, Arterial: 7.444 (ref 7.350–7.450)
pO2, Arterial: 124 mmHg — ABNORMAL HIGH (ref 83.0–108.0)

## 2021-05-22 NOTE — Progress Notes (Signed)
Patient's CBC resulted. Levonne Spiller, RN with Dr. Kipp Brood made aware of hemoglobin, hematocrit, and RBC results. Ryan notified Dr. Kipp Brood and Dr. Valeta Harms.

## 2021-05-22 NOTE — Progress Notes (Addendum)
PCP - Dr. Deland Pretty Cardiologist - Dr. Rex Kras  PPM/ICD - n/a Device Orders - n/a Rep Notified - n/a  Chest x-ray - 05/22/21 EKG - 05/14/21. Patent brought copy of EKG that was done at Dr. Pennie Banter office on 05/14/21. Placed in chart.  Stress Test - denies ECHO - 09/11/20 Cardiac Cath - denies PFT's- 04/09/21  Sleep Study - Many years ago per patient.  CPAP - Patient denies using a CPAP  Fasting Blood Sugar - n/a Checks Blood Sugar _____ times a day- n/a  Blood Thinner Instructions: n/a Aspirin Instructions: As of today stop taking Aspirin unless otherwise instructed by your surgeon.   ERAS Protcol - No. NPO after midnight  COVID TEST- 05/22/21. Pending.   Patient states she stopped taking methotrexate (RHEUMATREX) and inFLIXimab (REMICADE) several weeks ago per her rheumatologist.   Anesthesia review: Yes. Abnormal CBC. Levonne Spiller, RN made aware and notified Dr. Kipp Brood and Dr. Valeta Harms.   Patient denies shortness of breath, fever, cough and chest pain at PAT appointment   All instructions explained to the patient, with a verbal understanding of the material. Patient agrees to go over the instructions while at home for a better understanding. Patient also instructed to self quarantine after being tested for COVID-19. The opportunity to ask questions was provided.

## 2021-05-22 NOTE — Progress Notes (Signed)
Levonne Spiller, RN with Dr. Abran Duke office made aware of patient's abnormal urinalysis result. Dr. Valeta Harms also included on message.

## 2021-05-23 NOTE — Anesthesia Preprocedure Evaluation (Addendum)
Anesthesia Evaluation  Patient identified by MRN, date of birth, ID band Patient awake    Reviewed: Allergy & Precautions, NPO status , Patient's Chart, lab work & pertinent test results  History of Anesthesia Complications Negative for: history of anesthetic complications  Airway Mallampati: II  TM Distance: >3 FB Neck ROM: Full    Dental  (+) Dental Advisory Given   Pulmonary sleep apnea and Continuous Positive Airway Pressure Ventilation ,  05/22/2021 SARS coronavirus NEG LLL lung mass   breath sounds clear to auscultation       Cardiovascular hypertension, Pt. on medications (-) angina Rhythm:Regular Rate:Normal  08/2020 ECHO: EF 62%, normal LVF, Grade 1 DD, mild TR   Neuro/Psych  Headaches, Anxiety Depression TIA   GI/Hepatic Neg liver ROS, GERD  Medicated and Controlled,  Endo/Other  negative endocrine ROS  Renal/GU Renal InsufficiencyRenal disease     Musculoskeletal  (+) Arthritis , Rheumatoid disorders,    Abdominal   Peds  Hematology  (+) Blood dyscrasia (Hb 8.0), anemia ,   Anesthesia Other Findings   Reproductive/Obstetrics                            Anesthesia Physical Anesthesia Plan  ASA: 3  Anesthesia Plan: General   Post-op Pain Management: Tylenol PO (pre-op)   Induction: Intravenous  PONV Risk Score and Plan: 3 and Ondansetron, Dexamethasone and Treatment may vary due to age or medical condition  Airway Management Planned: Oral ETT and Double Lumen EBT  Additional Equipment: Arterial line  Intra-op Plan:   Post-operative Plan: Extubation in OR  Informed Consent: I have reviewed the patients History and Physical, chart, labs and discussed the procedure including the risks, benefits and alternatives for the proposed anesthesia with the patient or authorized representative who has indicated his/her understanding and acceptance.     Dental advisory  given  Plan Discussed with: CRNA and Surgeon  Anesthesia Plan Comments: (PAT note by Karoline Caldwell, PA-C: Patient had recent cardiology evaluation for report of chest pain.  She was seen by Dr. Terri Skains and underwent coronary CTA and echocardiogram.  Coronary CT 08/2020 showed calcium score of 0, mild stenosis due to noncalcified plaque in the proximal RCA.  Echo 08/2020 showed normal LVEF, no significant valvular abnormalities.  Recently admitted 8/30-8 3122 for TIA.  MRI and MRA of the brain were normal.  Carotid ultrasound showed no large vessel disease.  Echo showed no significant abnormality.  She followed up outpatient with neurology on 01/08/2021.  She was recommended to continue 81 mg ASA and Lipitor for risk factor reduction.  She is also followed by neurology for chronic migraine headaches.  History of rheumatoid arthritis maintained on Remicade and methotrexate.  Preop labs reviewed, sodium mildly low at 131, creatinine mildly elevated 1.25.  Significant anemia noted with hemoglobin of 8.0.  This is down slightly from labs done at PCP Dr. Pennie Banter office on 05/16/2021 which showed hemoglobin 8.9 at that time.  She has previously been diagnosed with bleeding hemorrhoids.  EKG 05/14/2021 (copy on chart): Sinus rhythm.  Rate 89.  RSR in V1-nondiagnostic.  CT super D chest 05/11/2021: IMPRESSION: 1. Irregular solid 4.3 cm left lower lobe lung mass with mildly spiculated margins and associated bronchiectasis and distortion, increased from 3.3 cm on 02/12/2017 chest CT. 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 2. No thoracic adenopathy or other findings suspicious for metastatic disease  in the chest.  Echocardiogram: 09/11/2020: Left ventricle cavity is normal in size and wall thickness. Normal global wall motion. Normal LV systolic function with EF 62%. Doppler evidence of grade I (impaired) diastolic  dysfunction, normal LAP. Trileaflet aortic valve with mild aortic valve leaflet calcification. Trace aortic regurgitation. Mild tricuspid regurgitation. No evidence of pulmonary hypertension.  Coronary CTA 09/19/2020: 1. Total coronary calcium score of 0. 2. Normal coronary origin with right dominance. 3. Aortic atherosclerosis. 4. CAD-RADS = 2. Mild stenosis (25-49%) due to non-calcified plaque in the proximal RCA. Noncardiac findings: 1. Left lower lobe lung mass, slightly enlarged compared to 02/12/2017. This was biopsied on 02/26/2017 with no evidence of malignancy.  )      Anesthesia Quick Evaluation

## 2021-05-23 NOTE — Progress Notes (Signed)
Anesthesia Chart Review:  Patient had recent cardiology evaluation for report of chest pain.  She was seen by Dr. Terri Skains and underwent coronary CTA and echocardiogram.  Coronary CT 08/2020 showed calcium score of 0, mild stenosis due to noncalcified plaque in the proximal RCA.  Echo 08/2020 showed normal LVEF, no significant valvular abnormalities.  Recently admitted 8/30-8 3122 for TIA.  MRI and MRA of the brain were normal.  Carotid ultrasound showed no large vessel disease.  Echo showed no significant abnormality.  She followed up outpatient with neurology on 01/08/2021.  She was recommended to continue 81 mg ASA and Lipitor for risk factor reduction.  She is also followed by neurology for chronic migraine headaches.  History of rheumatoid arthritis maintained on Remicade and methotrexate.  Preop labs reviewed, sodium mildly low at 131, creatinine mildly elevated 1.25.  Significant anemia noted with hemoglobin of 8.0.  This is down slightly from labs done at PCP Dr. Pennie Banter office on 05/16/2021 which showed hemoglobin 8.9 at that time.  She has previously been diagnosed with bleeding hemorrhoids.  EKG 05/14/2021 (copy on chart): Sinus rhythm.  Rate 89.  RSR in V1-nondiagnostic.  CT super D chest 05/11/2021: IMPRESSION: 1. Irregular solid 4.3 cm left lower lobe lung mass with mildly spiculated margins and associated bronchiectasis and distortion, increased from 3.3 cm on 02/12/2017 chest CT. 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 2. No thoracic adenopathy or other findings suspicious for metastatic disease in the chest.  Echocardiogram: 09/11/2020: Left ventricle cavity is normal in size and wall thickness. Normal global wall motion. Normal LV systolic function with EF 62%. Doppler evidence of grade I (impaired) diastolic dysfunction, normal LAP. Trileaflet aortic valve with mild aortic valve leaflet  calcification. Trace aortic regurgitation. Mild tricuspid regurgitation. No evidence of pulmonary hypertension.   Coronary CTA 09/19/2020: 1. Total coronary calcium score of 0. 2. Normal coronary origin with right dominance. 3. Aortic atherosclerosis. 4. CAD-RADS = 2. Mild stenosis (25-49%) due to non-calcified plaque in the proximal RCA. Noncardiac findings: 1. Left lower lobe lung mass, slightly enlarged compared to 02/12/2017. This was biopsied on 02/26/2017 with no evidence of malignancy.    Wynonia Musty [facility] Short Stay Center/Anesthesiology Phone [phone] 05/23/2021 12:33 PM

## 2021-05-24 ENCOUNTER — Inpatient Hospital Stay (HOSPITAL_COMMUNITY)
Admission: RE | Admit: 2021-05-24 | Discharge: 2021-05-30 | DRG: 164 | Disposition: A | Discharge status: Home / Self Care | Payer: MEDICARE | Attending: Thoracic Surgery (Cardiothoracic Vascular Surgery) | Admitting: Thoracic Surgery (Cardiothoracic Vascular Surgery)

## 2021-05-24 ENCOUNTER — Ambulatory Visit (HOSPITAL_COMMUNITY): Payer: MEDICARE

## 2021-05-24 ENCOUNTER — Other Ambulatory Visit: Payer: Self-pay

## 2021-05-24 ENCOUNTER — Inpatient Hospital Stay (HOSPITAL_COMMUNITY): Payer: MEDICARE

## 2021-05-24 ENCOUNTER — Encounter (HOSPITAL_COMMUNITY)
Admission: RE | Disposition: A | Discharge status: Home / Self Care | Payer: Self-pay | Source: Home / Self Care | Attending: Thoracic Surgery (Cardiothoracic Vascular Surgery)

## 2021-05-24 ENCOUNTER — Ambulatory Visit (HOSPITAL_COMMUNITY): Payer: MEDICARE | Admitting: Certified Registered Nurse Anesthetist

## 2021-05-24 ENCOUNTER — Encounter (HOSPITAL_COMMUNITY): Payer: Self-pay | Admitting: Pulmonary Disease

## 2021-05-24 DIAGNOSIS — K219 Gastro-esophageal reflux disease without esophagitis: Secondary | ICD-10-CM | POA: Diagnosis present

## 2021-05-24 DIAGNOSIS — R918 Other nonspecific abnormal finding of lung field: Secondary | ICD-10-CM | POA: Diagnosis present

## 2021-05-24 DIAGNOSIS — Z90710 Acquired absence of both cervix and uterus: Secondary | ICD-10-CM

## 2021-05-24 DIAGNOSIS — Z20822 Contact with and (suspected) exposure to covid-19: Secondary | ICD-10-CM | POA: Diagnosis present

## 2021-05-24 DIAGNOSIS — D62 Acute posthemorrhagic anemia: Secondary | ICD-10-CM | POA: Diagnosis not present

## 2021-05-24 DIAGNOSIS — Z7982 Long term (current) use of aspirin: Secondary | ICD-10-CM

## 2021-05-24 DIAGNOSIS — Z79620 Long term (current) use of immunosuppressive biologic: Secondary | ICD-10-CM

## 2021-05-24 DIAGNOSIS — G43909 Migraine, unspecified, not intractable, without status migrainosus: Secondary | ICD-10-CM | POA: Diagnosis present

## 2021-05-24 DIAGNOSIS — Z79899 Other long term (current) drug therapy: Secondary | ICD-10-CM

## 2021-05-24 DIAGNOSIS — Z888 Allergy status to other drugs, medicaments and biological substances status: Secondary | ICD-10-CM

## 2021-05-24 DIAGNOSIS — K660 Peritoneal adhesions (postprocedural) (postinfection): Secondary | ICD-10-CM | POA: Diagnosis present

## 2021-05-24 DIAGNOSIS — J9811 Atelectasis: Secondary | ICD-10-CM | POA: Diagnosis not present

## 2021-05-24 DIAGNOSIS — M1990 Unspecified osteoarthritis, unspecified site: Secondary | ICD-10-CM | POA: Diagnosis present

## 2021-05-24 DIAGNOSIS — Z4682 Encounter for fitting and adjustment of non-vascular catheter: Secondary | ICD-10-CM

## 2021-05-24 DIAGNOSIS — R911 Solitary pulmonary nodule: Secondary | ICD-10-CM | POA: Diagnosis present

## 2021-05-24 DIAGNOSIS — J939 Pneumothorax, unspecified: Secondary | ICD-10-CM | POA: Diagnosis not present

## 2021-05-24 DIAGNOSIS — R591 Generalized enlarged lymph nodes: Secondary | ICD-10-CM | POA: Diagnosis present

## 2021-05-24 DIAGNOSIS — Z8673 Personal history of transient ischemic attack (TIA), and cerebral infarction without residual deficits: Secondary | ICD-10-CM

## 2021-05-24 DIAGNOSIS — I310 Chronic adhesive pericarditis: Secondary | ICD-10-CM | POA: Diagnosis present

## 2021-05-24 DIAGNOSIS — Z85828 Personal history of other malignant neoplasm of skin: Secondary | ICD-10-CM | POA: Diagnosis not present

## 2021-05-24 DIAGNOSIS — Z886 Allergy status to analgesic agent status: Secondary | ICD-10-CM | POA: Diagnosis not present

## 2021-05-24 DIAGNOSIS — M069 Rheumatoid arthritis, unspecified: Secondary | ICD-10-CM | POA: Diagnosis not present

## 2021-05-24 DIAGNOSIS — R110 Nausea: Secondary | ICD-10-CM | POA: Diagnosis not present

## 2021-05-24 DIAGNOSIS — M0609 Rheumatoid arthritis without rheumatoid factor, multiple sites: Secondary | ICD-10-CM | POA: Diagnosis present

## 2021-05-24 DIAGNOSIS — I10 Essential (primary) hypertension: Secondary | ICD-10-CM | POA: Diagnosis present

## 2021-05-24 DIAGNOSIS — C3432 Malignant neoplasm of lower lobe, left bronchus or lung: Secondary | ICD-10-CM | POA: Diagnosis present

## 2021-05-24 DIAGNOSIS — G4730 Sleep apnea, unspecified: Secondary | ICD-10-CM | POA: Diagnosis present

## 2021-05-24 DIAGNOSIS — N179 Acute kidney failure, unspecified: Secondary | ICD-10-CM | POA: Diagnosis not present

## 2021-05-24 DIAGNOSIS — Z419 Encounter for procedure for purposes other than remedying health state, unspecified: Secondary | ICD-10-CM

## 2021-05-24 DIAGNOSIS — Z9689 Presence of other specified functional implants: Secondary | ICD-10-CM

## 2021-05-24 DIAGNOSIS — J439 Emphysema, unspecified: Secondary | ICD-10-CM | POA: Diagnosis not present

## 2021-05-24 DIAGNOSIS — J90 Pleural effusion, not elsewhere classified: Secondary | ICD-10-CM | POA: Diagnosis not present

## 2021-05-24 DIAGNOSIS — F418 Other specified anxiety disorders: Secondary | ICD-10-CM | POA: Diagnosis not present

## 2021-05-24 DIAGNOSIS — M0589 Other rheumatoid arthritis with rheumatoid factor of multiple sites: Secondary | ICD-10-CM | POA: Diagnosis not present

## 2021-05-24 DIAGNOSIS — Z902 Acquired absence of lung [part of]: Secondary | ICD-10-CM

## 2021-05-24 HISTORY — PX: FIDUCIAL MARKER PLACEMENT: SHX6858

## 2021-05-24 HISTORY — PX: BRONCHIAL BIOPSY: SHX5109

## 2021-05-24 HISTORY — PX: BRONCHIAL NEEDLE ASPIRATION BIOPSY: SHX5106

## 2021-05-24 HISTORY — PX: VIDEO BRONCHOSCOPY WITH RADIAL ENDOBRONCHIAL ULTRASOUND: SHX6849

## 2021-05-24 HISTORY — PX: INTERCOSTAL NERVE BLOCK: SHX5021

## 2021-05-24 HISTORY — PX: LYMPH NODE DISSECTION: SHX5087

## 2021-05-24 HISTORY — PX: BRONCHIAL BRUSHINGS: SHX5108

## 2021-05-24 LAB — ABO/RH: ABO/RH(D): O POS

## 2021-05-24 SURGERY — BRONCHOSCOPY, WITH BIOPSY USING ELECTROMAGNETIC NAVIGATION
Anesthesia: General

## 2021-05-24 SURGERY — LOBECTOMY, LUNG, ROBOT-ASSISTED, USING VATS
Anesthesia: General | Site: Chest | Laterality: Left

## 2021-05-24 MED ORDER — LORATADINE 10 MG PO TABS: 5.0000 mg | ORAL_TABLET | Freq: Every evening | ORAL | Status: DC | PRN

## 2021-05-24 MED ORDER — ROCURONIUM BROMIDE 10 MG/ML (PF) SYRINGE
PREFILLED_SYRINGE | INTRAVENOUS | Status: DC | PRN
  Administered 2021-05-24: 20 mg via INTRAVENOUS
  Administered 2021-05-24: 50 mg via INTRAVENOUS
  Administered 2021-05-24: 30 mg via INTRAVENOUS
  Administered 2021-05-24: 50 mg via INTRAVENOUS

## 2021-05-24 MED ORDER — BISACODYL 5 MG PO TBEC
10.0000 mg | DELAYED_RELEASE_TABLET | Freq: Every day | ORAL | Status: DC
  Administered 2021-05-25 – 2021-05-27 (×3): 10 mg via ORAL
  Filled 2021-05-24 (×5): qty 2

## 2021-05-24 MED ORDER — ACETAMINOPHEN 500 MG PO TABS
1000.0000 mg | ORAL_TABLET | Freq: Four times a day (QID) | ORAL | Status: AC
  Administered 2021-05-24 – 2021-05-29 (×16): 1000 mg via ORAL
  Filled 2021-05-24 (×17): qty 2

## 2021-05-24 MED ORDER — FENTANYL CITRATE (PF) 100 MCG/2ML IJ SOLN
INTRAMUSCULAR | Status: AC
  Filled 2021-05-24: qty 2

## 2021-05-24 MED ORDER — TRIAMTERENE-HCTZ 37.5-25 MG PO TABS
1.0000 | ORAL_TABLET | Freq: Every day | ORAL | Status: DC
  Filled 2021-05-24: qty 1

## 2021-05-24 MED ORDER — LACTATED RINGERS IV SOLN: INTRAVENOUS | Status: DC

## 2021-05-24 MED ORDER — DEXAMETHASONE SODIUM PHOSPHATE 10 MG/ML IJ SOLN
INTRAMUSCULAR | Status: DC | PRN
  Administered 2021-05-24: 8 mg via INTRAVENOUS

## 2021-05-24 MED ORDER — EPHEDRINE SULFATE-NACL 50-0.9 MG/10ML-% IV SOSY
PREFILLED_SYRINGE | INTRAVENOUS | Status: DC | PRN
  Administered 2021-05-24: 5 mg via INTRAVENOUS

## 2021-05-24 MED ORDER — ACETAMINOPHEN 500 MG PO TABS
1000.0000 mg | ORAL_TABLET | Freq: Once | ORAL | Status: AC
  Administered 2021-05-24: 1000 mg via ORAL
  Filled 2021-05-24: qty 2

## 2021-05-24 MED ORDER — PANTOPRAZOLE SODIUM 40 MG PO TBEC
40.0000 mg | DELAYED_RELEASE_TABLET | Freq: Every morning | ORAL | Status: DC
  Administered 2021-05-25 – 2021-05-29 (×5): 40 mg via ORAL
  Filled 2021-05-24 (×6): qty 1

## 2021-05-24 MED ORDER — CEFAZOLIN SODIUM-DEXTROSE 2-4 GM/100ML-% IV SOLN
2.0000 g | INTRAVENOUS | Status: AC
  Administered 2021-05-24: 2 g via INTRAVENOUS

## 2021-05-24 MED ORDER — PHENYLEPHRINE HCL-NACL 20-0.9 MG/250ML-% IV SOLN
INTRAVENOUS | Status: DC | PRN
  Administered 2021-05-24: 30 ug/min via INTRAVENOUS

## 2021-05-24 MED ORDER — CHLORHEXIDINE GLUCONATE 0.12 % MT SOLN: 15.0000 mL | Freq: Once | OROMUCOSAL | Status: AC

## 2021-05-24 MED ORDER — ONDANSETRON HCL 4 MG/2ML IJ SOLN
4.0000 mg | Freq: Four times a day (QID) | INTRAMUSCULAR | Status: DC | PRN
  Administered 2021-05-24: 4 mg via INTRAVENOUS
  Filled 2021-05-24: qty 2

## 2021-05-24 MED ORDER — CEFAZOLIN SODIUM-DEXTROSE 2-4 GM/100ML-% IV SOLN
INTRAVENOUS | Status: AC
  Filled 2021-05-24: qty 100

## 2021-05-24 MED ORDER — PHENYLEPHRINE HCL (PRESSORS) 10 MG/ML IV SOLN
INTRAVENOUS | Status: DC | PRN
  Administered 2021-05-24: 80 ug via INTRAVENOUS
  Administered 2021-05-24: 120 ug via INTRAVENOUS
  Administered 2021-05-24: 80 ug via INTRAVENOUS
  Administered 2021-05-24: 40 ug via INTRAVENOUS
  Administered 2021-05-24: 80 ug via INTRAVENOUS
  Administered 2021-05-24 (×2): 40 ug via INTRAVENOUS
  Administered 2021-05-24: 80 ug via INTRAVENOUS

## 2021-05-24 MED ORDER — PROPOFOL 10 MG/ML IV BOLUS
INTRAVENOUS | Status: DC | PRN
  Administered 2021-05-24: 90 mg via INTRAVENOUS
  Administered 2021-05-24: 100 mg via INTRAVENOUS

## 2021-05-24 MED ORDER — PROPOFOL 10 MG/ML IV BOLUS
INTRAVENOUS | Status: AC
  Filled 2021-05-24: qty 20

## 2021-05-24 MED ORDER — METHYLENE BLUE 0.5 % INJ SOLN
INTRAVENOUS | Status: DC | PRN
  Administered 2021-05-24: 1.5 mL

## 2021-05-24 MED ORDER — ENOXAPARIN SODIUM 40 MG/0.4ML IJ SOSY
40.0000 mg | PREFILLED_SYRINGE | Freq: Every day | INTRAMUSCULAR | Status: DC
  Administered 2021-05-25: 40 mg via SUBCUTANEOUS
  Filled 2021-05-24: qty 0.4

## 2021-05-24 MED ORDER — MAGNESIUM OXIDE -MG SUPPLEMENT 400 (240 MG) MG PO TABS
400.0000 mg | ORAL_TABLET | ORAL | Status: DC
  Administered 2021-05-24 – 2021-05-28 (×3): 400 mg via ORAL
  Filled 2021-05-24 (×4): qty 1

## 2021-05-24 MED ORDER — SODIUM CHLORIDE FLUSH 0.9 % IV SOLN
INTRAVENOUS | Status: DC | PRN
  Administered 2021-05-24: 100 mL

## 2021-05-24 MED ORDER — 0.9 % SODIUM CHLORIDE (POUR BTL) OPTIME
TOPICAL | Status: DC | PRN
  Administered 2021-05-24: 2000 mL

## 2021-05-24 MED ORDER — FLUTICASONE PROPIONATE 50 MCG/ACT NA SUSP
1.0000 | Freq: Every day | NASAL | Status: DC | PRN
  Filled 2021-05-24: qty 16

## 2021-05-24 MED ORDER — METOCLOPRAMIDE HCL 5 MG/ML IJ SOLN
10.0000 mg | Freq: Four times a day (QID) | INTRAMUSCULAR | Status: AC
  Administered 2021-05-24 – 2021-05-25 (×3): 10 mg via INTRAVENOUS
  Filled 2021-05-24 (×3): qty 2

## 2021-05-24 MED ORDER — LIDOCAINE 2% (20 MG/ML) 5 ML SYRINGE
INTRAMUSCULAR | Status: DC | PRN
Start: 2021-05-24 — End: 2021-05-24
  Administered 2021-05-24: 60 mg via INTRAVENOUS

## 2021-05-24 MED ORDER — LACTATED RINGERS IV SOLN
INTRAVENOUS | Status: DC | PRN
Start: 2021-05-24 — End: 2021-05-24

## 2021-05-24 MED ORDER — FENTANYL CITRATE (PF) 250 MCG/5ML IJ SOLN
INTRAMUSCULAR | Status: DC | PRN
  Administered 2021-05-24: 50 ug via INTRAVENOUS
  Administered 2021-05-24: 100 ug via INTRAVENOUS
  Administered 2021-05-24: 50 ug via INTRAVENOUS

## 2021-05-24 MED ORDER — ATORVASTATIN CALCIUM 80 MG PO TABS
80.0000 mg | ORAL_TABLET | Freq: Every day | ORAL | Status: DC
  Administered 2021-05-25 – 2021-05-29 (×5): 80 mg via ORAL
  Filled 2021-05-24 (×6): qty 1

## 2021-05-24 MED ORDER — CEFAZOLIN SODIUM-DEXTROSE 2-4 GM/100ML-% IV SOLN
2.0000 g | Freq: Three times a day (TID) | INTRAVENOUS | Status: AC
  Administered 2021-05-24 – 2021-05-25 (×2): 2 g via INTRAVENOUS
  Filled 2021-05-24 (×2): qty 100

## 2021-05-24 MED ORDER — POLYVINYL ALCOHOL 1.4 % OP SOLN
1.0000 [drp] | OPHTHALMIC | Status: DC | PRN
  Filled 2021-05-24: qty 15

## 2021-05-24 MED ORDER — TRAMADOL HCL 50 MG PO TABS
50.0000 mg | ORAL_TABLET | Freq: Four times a day (QID) | ORAL | Status: DC | PRN
  Administered 2021-05-25: 50 mg via ORAL
  Filled 2021-05-24: qty 1

## 2021-05-24 MED ORDER — FOLIC ACID 1 MG PO TABS
1.0000 mg | ORAL_TABLET | Freq: Every day | ORAL | Status: DC
  Administered 2021-05-24 – 2021-05-29 (×6): 1 mg via ORAL
  Filled 2021-05-24 (×7): qty 1

## 2021-05-24 MED ORDER — SENNOSIDES-DOCUSATE SODIUM 8.6-50 MG PO TABS
1.0000 | ORAL_TABLET | Freq: Every day | ORAL | Status: DC
  Administered 2021-05-25 – 2021-05-26 (×2): 1 via ORAL
  Filled 2021-05-24 (×4): qty 1

## 2021-05-24 MED ORDER — OLOPATADINE HCL 0.1 % OP SOLN
1.0000 [drp] | Freq: Every day | OPHTHALMIC | Status: DC | PRN
  Filled 2021-05-24: qty 5

## 2021-05-24 MED ORDER — MORPHINE SULFATE (PF) 2 MG/ML IV SOLN
2.0000 mg | INTRAVENOUS | Status: DC | PRN
  Administered 2021-05-24 – 2021-05-25 (×3): 2 mg via INTRAVENOUS
  Filled 2021-05-24 (×3): qty 1

## 2021-05-24 MED ORDER — ACETAMINOPHEN 160 MG/5ML PO SOLN: 1000.0000 mg | Freq: Four times a day (QID) | ORAL | Status: AC

## 2021-05-24 MED ORDER — KETOROLAC TROMETHAMINE 15 MG/ML IJ SOLN
15.0000 mg | Freq: Four times a day (QID) | INTRAMUSCULAR | Status: DC
  Administered 2021-05-24 – 2021-05-25 (×3): 15 mg via INTRAVENOUS
  Filled 2021-05-24 (×3): qty 1

## 2021-05-24 MED ORDER — CHLORHEXIDINE GLUCONATE 0.12 % MT SOLN
OROMUCOSAL | Status: AC
  Administered 2021-05-24: 15 mL via OROMUCOSAL
  Filled 2021-05-24: qty 15

## 2021-05-24 MED ORDER — FENTANYL CITRATE (PF) 100 MCG/2ML IJ SOLN
25.0000 ug | INTRAMUSCULAR | Status: DC | PRN
  Administered 2021-05-24 (×4): 25 ug via INTRAVENOUS

## 2021-05-24 MED ORDER — ONDANSETRON HCL 4 MG/2ML IJ SOLN
INTRAMUSCULAR | Status: DC | PRN
  Administered 2021-05-24: 4 mg via INTRAVENOUS

## 2021-05-24 MED ORDER — FENTANYL CITRATE (PF) 250 MCG/5ML IJ SOLN
INTRAMUSCULAR | Status: AC
  Filled 2021-05-24: qty 5

## 2021-05-24 MED ORDER — SUGAMMADEX SODIUM 200 MG/2ML IV SOLN
INTRAVENOUS | Status: DC | PRN
  Administered 2021-05-24: 203.2 mg via INTRAVENOUS

## 2021-05-24 SURGICAL SUPPLY — 116 items
ADH SKN CLS APL DERMABOND .7 (GAUZE/BANDAGES/DRESSINGS) ×1
APL PRP STRL LF DISP 70% ISPRP (MISCELLANEOUS) ×1
BAG SPEC RTRVL C1550 15 (MISCELLANEOUS) ×1
BLADE CLIPPER SURG (BLADE) ×2 IMPLANT
BLADE SURG 11 STRL SS (BLADE) ×3 IMPLANT
BNDG COHESIVE 6X5 TAN STRL LF (GAUZE/BANDAGES/DRESSINGS) ×3 IMPLANT
CANISTER SUCT 3000ML PPV (MISCELLANEOUS) ×6 IMPLANT
CANNULA REDUC XI 12-8 STAPL (CANNULA) ×4
CANNULA REDUCER 12-8 DVNC XI (CANNULA) ×4 IMPLANT
CATH TROCAR 20FR (CATHETERS) IMPLANT
CHLORAPREP W/TINT 26 (MISCELLANEOUS) ×3 IMPLANT
CLIP VESOCCLUDE MED 6/CT (CLIP) IMPLANT
CNTNR URN SCR LID CUP LEK RST (MISCELLANEOUS) ×10 IMPLANT
CONN ST 1/4X3/8  BEN (MISCELLANEOUS)
CONN ST 1/4X3/8 BEN (MISCELLANEOUS) IMPLANT
CONT SPEC 4OZ STRL OR WHT (MISCELLANEOUS) ×18
DEFOGGER SCOPE WARMER CLEARIFY (MISCELLANEOUS) ×3 IMPLANT
DERMABOND ADVANCED (GAUZE/BANDAGES/DRESSINGS) ×1
DERMABOND ADVANCED .7 DNX12 (GAUZE/BANDAGES/DRESSINGS) ×2 IMPLANT
DISSECTOR BLUNT TIP ENDO 5MM (MISCELLANEOUS) IMPLANT
DRAIN CHANNEL 28F RND 3/8 FF (WOUND CARE) IMPLANT
DRAPE ARM DVNC X/XI (DISPOSABLE) ×8 IMPLANT
DRAPE COLUMN DVNC XI (DISPOSABLE) ×2 IMPLANT
DRAPE CV SPLIT W-CLR ANES SCRN (DRAPES) ×3 IMPLANT
DRAPE DA VINCI XI ARM (DISPOSABLE) ×8
DRAPE DA VINCI XI COLUMN (DISPOSABLE) ×2
DRAPE ORTHO SPLIT 77X108 STRL (DRAPES) ×2
DRAPE SURG ORHT 6 SPLT 77X108 (DRAPES) ×2 IMPLANT
ELECT BLADE 6.5 EXT (BLADE) IMPLANT
ELECT REM PT RETURN 9FT ADLT (ELECTROSURGICAL) ×2
ELECTRODE REM PT RTRN 9FT ADLT (ELECTROSURGICAL) ×2 IMPLANT
GAUZE 4X4 16PLY ~~LOC~~+RFID DBL (SPONGE) ×3 IMPLANT
GAUZE KITTNER 4X8 (MISCELLANEOUS) ×3 IMPLANT
GAUZE SPONGE 4X4 12PLY STRL (GAUZE/BANDAGES/DRESSINGS) ×3 IMPLANT
GLOVE SURG ENC MOIS LTX SZ7.5 (GLOVE) ×6 IMPLANT
GLOVE SURG POLYISO LF SZ8 (GLOVE) ×3 IMPLANT
GLOVE SURG SYN 7.5  E (GLOVE) ×2
GLOVE SURG SYN 7.5 E (GLOVE) ×1 IMPLANT
GLOVE SURG SYN 7.5 PF PI (GLOVE) IMPLANT
GOWN STRL REUS W/ TWL LRG LVL3 (GOWN DISPOSABLE) ×4 IMPLANT
GOWN STRL REUS W/ TWL XL LVL3 (GOWN DISPOSABLE) ×6 IMPLANT
GOWN STRL REUS W/TWL 2XL LVL3 (GOWN DISPOSABLE) ×3 IMPLANT
GOWN STRL REUS W/TWL LRG LVL3 (GOWN DISPOSABLE) ×4
GOWN STRL REUS W/TWL XL LVL3 (GOWN DISPOSABLE) ×6
HEMOSTAT SURGICEL 2X14 (HEMOSTASIS) ×9 IMPLANT
IRRIGATION STRYKERFLOW (MISCELLANEOUS) IMPLANT
IRRIGATOR STRYKERFLOW (MISCELLANEOUS)
KIT BASIN OR (CUSTOM PROCEDURE TRAY) ×3 IMPLANT
KIT TURNOVER KIT B (KITS) ×3 IMPLANT
NEEDLE 22X1 1/2 (OR ONLY) (NEEDLE) ×3 IMPLANT
NS IRRIG 1000ML POUR BTL (IV SOLUTION) ×9 IMPLANT
PACK CHEST (CUSTOM PROCEDURE TRAY) ×3 IMPLANT
PAD ARMBOARD 7.5X6 YLW CONV (MISCELLANEOUS) ×15 IMPLANT
PORT ACCESS TROCAR AIRSEAL 12 (TROCAR) ×2 IMPLANT
PORT ACCESS TROCAR AIRSEAL 5M (TROCAR) ×1
POUCH ENDO CATCH II 15MM (MISCELLANEOUS) IMPLANT
RELOAD STAPLE 45 2.5 WHT DVNC (STAPLE) IMPLANT
RELOAD STAPLE 45 3.5 BLU DVNC (STAPLE) IMPLANT
RELOAD STAPLE 45 4.3 GRN DVNC (STAPLE) IMPLANT
RELOAD STAPLER 2.5X45 WHT DVNC (STAPLE) ×2 IMPLANT
RELOAD STAPLER 3.5X45 BLU DVNC (STAPLE) ×1 IMPLANT
RELOAD STAPLER 4.3X45 GRN DVNC (STAPLE) ×1 IMPLANT
RETRACTOR WOUND ALXS 19CM XSML (INSTRUMENTS) IMPLANT
RTRCTR WOUND ALEXIS 19CM XSML (INSTRUMENTS)
SCISSORS LAP 5X35 DISP (ENDOMECHANICALS) IMPLANT
SEAL CANN UNIV 5-8 DVNC XI (MISCELLANEOUS) ×4 IMPLANT
SEAL XI 5MM-8MM UNIVERSAL (MISCELLANEOUS) ×4
SEALANT PROGEL (MISCELLANEOUS) IMPLANT
SEALER LIGASURE MARYLAND 30 (ELECTROSURGICAL) IMPLANT
SET TRI-LUMEN FLTR TB AIRSEAL (TUBING) ×3 IMPLANT
SOLUTION ELECTROLUBE (MISCELLANEOUS) IMPLANT
SPONGE INTESTINAL PEANUT (DISPOSABLE) IMPLANT
SPONGE T-LAP 18X18 ~~LOC~~+RFID (SPONGE) ×12 IMPLANT
SPONGE TONSIL TAPE 1 RFD (DISPOSABLE) IMPLANT
STAPLER 45 SUREFORM CVD (STAPLE) ×4
STAPLER 45 SUREFORM CVD DVNC (STAPLE) IMPLANT
STAPLER CANNULA SEAL DVNC XI (STAPLE) ×4 IMPLANT
STAPLER CANNULA SEAL XI (STAPLE) ×4
STAPLER RELOAD 2.5X45 WHITE (STAPLE) ×4
STAPLER RELOAD 2.5X45 WHT DVNC (STAPLE) ×2
STAPLER RELOAD 3.5X45 BLU DVNC (STAPLE) ×1
STAPLER RELOAD 3.5X45 BLUE (STAPLE) ×2
STAPLER RELOAD 4.3X45 GREEN (STAPLE) ×2
STAPLER RELOAD 4.3X45 GRN DVNC (STAPLE) ×1
STOPCOCK 4 WAY LG BORE MALE ST (IV SETS) ×3 IMPLANT
SUT MNCRL AB 3-0 PS2 18 (SUTURE) IMPLANT
SUT MON AB 2-0 CT1 36 (SUTURE) IMPLANT
SUT PDS AB 1 CTX 36 (SUTURE) IMPLANT
SUT PROLENE 4 0 RB 1 (SUTURE)
SUT PROLENE 4-0 RB1 .5 CRCL 36 (SUTURE) IMPLANT
SUT SILK  1 MH (SUTURE) ×2
SUT SILK 1 MH (SUTURE) ×2 IMPLANT
SUT SILK 1 TIES 10X30 (SUTURE) IMPLANT
SUT SILK 2 0 SH (SUTURE) ×1 IMPLANT
SUT SILK 2 0SH CR/8 30 (SUTURE) IMPLANT
SUT VIC AB 1 CTX 36 (SUTURE)
SUT VIC AB 1 CTX36XBRD ANBCTR (SUTURE) IMPLANT
SUT VIC AB 2-0 CT1 27 (SUTURE) ×2
SUT VIC AB 2-0 CT1 TAPERPNT 27 (SUTURE) ×2 IMPLANT
SUT VIC AB 3-0 SH 27 (SUTURE) ×4
SUT VIC AB 3-0 SH 27X BRD (SUTURE) ×4 IMPLANT
SUT VICRYL 0 TIES 12 18 (SUTURE) ×3 IMPLANT
SUT VICRYL 0 UR6 27IN ABS (SUTURE) ×6 IMPLANT
SUT VICRYL 2 TP 1 (SUTURE) IMPLANT
SYR 10ML LL (SYRINGE) ×3 IMPLANT
SYR 20ML LL LF (SYRINGE) ×3 IMPLANT
SYR 50ML LL SCALE MARK (SYRINGE) ×3 IMPLANT
SYSTEM RETRIEVAL ANCHOR 15 (MISCELLANEOUS) ×1 IMPLANT
SYSTEM SAHARA CHEST DRAIN ATS (WOUND CARE) ×3 IMPLANT
TAPE CLOTH 4X10 WHT NS (GAUZE/BANDAGES/DRESSINGS) ×3 IMPLANT
TAPE PAPER 3X10 WHT MICROPORE (GAUZE/BANDAGES/DRESSINGS) ×1 IMPLANT
TIP APPLICATOR SPRAY EXTEND 16 (VASCULAR PRODUCTS) IMPLANT
TOWEL GREEN STERILE (TOWEL DISPOSABLE) ×3 IMPLANT
TRAY FOLEY MTR SLVR 14FR STAT (SET/KITS/TRAYS/PACK) ×1 IMPLANT
TRAY FOLEY MTR SLVR 16FR STAT (SET/KITS/TRAYS/PACK) ×2 IMPLANT
TUBING EXTENTION W/L.L. (IV SETS) ×3 IMPLANT

## 2021-05-24 NOTE — Op Note (Addendum)
Video Bronchoscopy with Robotic Assisted Bronchoscopic Navigation   Date of Operation: 05/24/2021   Pre-op Diagnosis: Lung nodule   Post-op Diagnosis: Lung nodule  Surgeon: [name], DO   Assistants: None   Anesthesia: General endotracheal anesthesia  Operation: Flexible video fiberoptic bronchoscopy with robotic assistance and biopsies.  Estimated Blood Loss: Minimal  Complications: None  Indications and History: Kendra Taylor is a 76 y.o. female with history of lung mass. The risks, benefits, complications, treatment options and expected outcomes were discussed with the patient.  The possibilities of pneumothorax, pneumonia, reaction to medication, pulmonary aspiration, perforation of a viscus, bleeding, failure to diagnose a condition and creating a complication requiring transfusion or operation were discussed with the patient who freely signed the consent.    Description of Procedure: The patient was seen in the Preoperative Area, was examined and was deemed appropriate to proceed.  The patient was taken to [facility] endoscopy room 3, identified as Kendra Taylor and the procedure verified as Flexible Video Fiberoptic Bronchoscopy.  A Time Out was held and the above information confirmed.   Prior to the date of the procedure a high-resolution CT scan of the chest was performed. Utilizing ION software program a virtual tracheobronchial tree was generated to allow the creation of distinct navigation pathways to the patient's parenchymal abnormalities. After being taken to the operating room general anesthesia was initiated and the patient  was orally intubated. The video fiberoptic bronchoscope was introduced via the endotracheal tube and a general inspection was performed which showed normal right and left lung anatomy, aspiration of the bilateral mainstems was completed to remove any remaining secretions. Robotic catheter inserted into patient's endotracheal tube.    Target #1 LLL: The distinct navigation pathways prepared prior to this procedure were then utilized to navigate to patient's lesion identified on CT scan. The robotic catheter was secured into place and the vision probe was withdrawn.  Lesion location was approximated using fluoroscopy, 3D CBCT imaging, and radial endobronchial ultrasound for peripheral targeting. Under fluoroscopic guidance transbronchial needle brushings, transbronchial needle biopsies, and transbronchial forceps biopsies were performed to be sent for cytology and pathology. Following biopsies 1cc of 50%/50% mixture of ICG and MB was injected into the lesion.   At the end of the procedure a general airway inspection was performed and there was no evidence of active bleeding. The bronchoscope was removed.  The patient tolerated the procedure well. There was no significant blood loss and there were no obvious complications. A post-procedural chest x-ray is pending.  Samples Target #1: 1. Transbronchial needle brushings from left lower lobe  2. Transbronchial Wang needle biopsies from left lower lobe  3. Transbronchial forceps biopsies from left lower lobe   Plans:  Transferred to the OR under care of Dr. Kipp Brood for wedge resection and possible lobectomy   [name], DO [facility] 05/24/2021 1:23 PM

## 2021-05-24 NOTE — Interval H&P Note (Signed)
History and Physical Interval Note:  05/24/2021 10:58 AM  Kendra Taylor  has presented today for surgery, with the diagnosis of lung nodule.  The various methods of treatment have been discussed with the patient and family. After consideration of risks, benefits and other options for treatment, the patient has consented to  Procedure(s): ROBOTIC ASSISTED NAVIGATIONAL BRONCHOSCOPY (N/A) as a surgical intervention.  The patient's history has been reviewed, patient examined, no change in status, stable for surgery.  I have reviewed the patient's chart and labs.  Questions were answered to the patient's satisfaction.     [place]

## 2021-05-24 NOTE — Op Note (Signed)
[address]       [place] [address]             [phone]        05/24/2021  Patient:  Kendra Taylor Pre-Op Dx: Left lower lobe pulmonary mass   Post-op Dx:  same Procedure: - Robotic assisted left video thoracoscopy - Lysis of adehsion - left lower lobectomy - Mediastinal lymph node sampling - Intercostal nerve block  Surgeon and Role:      * Kimiye Strathman, Lucile Crater, MD - Primary  Assistant: Leretha Pol, PA-C  An experienced assistant was required given the complexity of this surgery and the standard of surgical care. The assistant was needed for exposure, dissection, suctioning, retraction of delicate tissues and sutures, instrument exchange and for overall help during this procedure.    Anesthesia  general EBL:  187ml Blood Administration: none Specimen:  left lower lower lobe, level 5, 7, 9, and hilar lymph nodes  Drains: 28 F argyle chest tube in left chest Counts: correct   Indications: 76 year old female with 3.6cm left lower lobe pulmonary mass concerning for primary lung cancer.  Acceptable pulmonary function testing.  We discussed several options including CT-guided biopsy versus navigational bronchoscopy to obtain a tissue diagnosis.  She ultimately decided to undergo a combination procedure which will include a navigational bronchoscopy performed by Dr. Valeta Harms followed by a left robotic assisted thoracoscopy with left lower lobe wedge resection and possible lobectomy.  Findings: Multiple adhesion to the diaphragm, heart, and apex.  The mass was also too large to perform a wedge resection safely without removing a significant portion of the lung, and without fracturing through the mass.  I elected to proceed with the lobectomy despite not having a diagnosis, since this would be the safest manner to proceed with the case.  Operative Technique: After the risks, benefits and alternatives were thoroughly discussed, the patient was brought  to the operative theatre.  Anesthesia was induced, and the patient was then placed in a right lateral decubitus position and was prepped and draped in normal sterile fashion.  An appropriate surgical pause was performed, and pre-operative antibiotics were dosed accordingly.  We began by placing our 4 robotic ports in the the 7th intercostal space targeting the hilum of the lung.  A 24mm assistant port was placed in the 9th intercostal space in the anterior axillary line.  The robot was then docked and all instruments were passed under direct visualization.    The lung was then retracted superiorly, and the inferior pulmonary ligament was divided.  The hilum was mobilized anteriorly and posteriorly.  We identified the inferior pulmonary vein, and after careful isolation, it was divided with a vascular stapler.  We next moved to the  fissure.  The artery was then divided with a vascular load stapler.  The bronchus to the lower lobe was then isolated.  After a test clamp, with good ventilation of the upper, the bronchus was then divided.  The fissure was completed, and the specimen was passed into an endocatch bag.  It was removed from the anterior access site.    Lymph nodes were then sampled at levels 5, 7,9, and hilum.  The chest was irrigated, and an air leak test was performed.  An intercostal nerve block was performed under direct visualization.  A 28 F chest with then placed, and we watch the remaining lobes re-expand.  The skin and soft tissue were closed with absorbable suture  The patient tolerated the procedure without any immediate complications, and was transferred to the PACU in stable condition.  Shewanda Sharpe Bary Leriche

## 2021-05-24 NOTE — H&P (Signed)
Synopsis: Referred in November 2022 for lung mass by Deland Pretty, MD   Subjective:    PATIENT ID: Kendra Taylor: female DOB: 1945-07-15, MRN: [id number]       Chief Complaint  Patient presents with   Follow-up      Patient is here to talk about her left lung        This is a 76 year old female with a past medical history of reflux, basal cell carcinoma, anxiety, rheumatoid arthritis on Remicade infusion.  Here for evaluation of a left lower lobe pulmonary nodule.  In 2018 she was taken for bronchoscopy for a left lower lobe pulmonary nodule.  There was no evidence of malignancy on tissue sampling.February 26, 2017 patient was taken for navigational bronchoscopy by Dr. Lamonte Sakai.  Biopsy of the lesion was negative.  She did not have any additional follow-up and failed to follow-up after the bronchoscopy.  She had a repeat coronary CT scan in May 2022.  This coronary CT scan revealed a 3.6 x 2.4 cm mass within the left lung.  Slightly enlarged from her October 2017 2018 CT.   OV 04/04/2021: Here today for follow-up after recent nuclear medicine pet imaging. Patient had nuclear medicine pet image completed on 03/28/2021.  This revealed low-level metabolic uptake within the near 3 cm part solid left lower lobe lesion.  No associated adenopathy.   OV 05/24/2021: Planned outpatient bronchscopy, dye mark followed by lobectomy with Dr. Kipp Brood        Past Medical History:  Diagnosis Date   Anemia     Anxiety     Arthritis     Atypical mole 01/08/2013    right inner forearm   Basal cell carcinoma 03/12/2017    right concha exc mohs   Depression     Dyspnea     GERD (gastroesophageal reflux disease)     HA (headache)     Migraine     Sleep apnea      cpap           Family History  Problem Relation Age of Onset   Dementia Mother     Sleep apnea Mother     Hyperthyroidism Mother     Dementia Father     Sleep apnea Father             Past Surgical History:   Procedure Laterality Date   ABDOMINAL HYSTERECTOMY       BUNIONECTOMY        both   FINGER ARTHROPLASTY Right 07/30/2012    Procedure: RIGHT INDEX FINGER, MIDDLE FINGER, RING FINGER, SMALL FINGER MCP ARTHROPLASTY WITH REALIGNMENT/REPAIR OF EXTENSOR TENDON;  Surgeon: Roseanne Kaufman, MD;  Location: [facility];  Service: Orthopedics;  Laterality: Right;   FINGER SURGERY       HAMMER TOE SURGERY Left 03/03/2014    Procedure: SECOND THROUGH FIFTH HAMMERTOE CORRECTION;  Surgeon: Wylene Simmer, MD;  Location: [facility];  Service: Orthopedics;  Laterality: Left;   JOINT REPLACEMENT       METATARSAL HEAD EXCISION Left 03/03/2014    Procedure: LEFT SECOND THROUGH FIFTH METATARSAL HEAD EXCISION;  Surgeon: Wylene Simmer, MD;  Location: [facility];  Service: Orthopedics;  Laterality: Left;   OOPHORECTOMY       VIDEO BRONCHOSCOPY Bilateral 12/18/2016    Procedure: VIDEO BRONCHOSCOPY WITH FLUORO;  Surgeon: Tanda Rockers, MD;  Location: [facility];  Service: Cardiopulmonary;  Laterality: Bilateral;   VIDEO BRONCHOSCOPY WITH ENDOBRONCHIAL NAVIGATION Left  02/26/2017    Procedure: VIDEO BRONCHOSCOPY WITH ENDOBRONCHIAL NAVIGATION;  Surgeon: Collene Gobble, MD;  Location: [facility];  Service: Thoracic;  Laterality: Left;      Social History         Socioeconomic History   Marital status: Married      Spouse name: Remo Lipps   Number of children: 0   Years of education: masters   Highest education level: Not on file  Occupational History      Comment: Retired  Tobacco Use   Smoking status: Never   Smokeless tobacco: Never  Vaping Use   Vaping Use: Not on file  Substance and Sexual Activity   Alcohol use: No      Alcohol/week: 0.0 standard drinks   Drug use: No   Sexual activity: Not on file  Other Topics Concern   Not on file  Social History Narrative    Patient lives at home with her spouse (Paltrineri Remo Lipps.) Patient has two cats.    Retired.    Arboriculturist.    Right  handed.    Caffeine One cup of coffee daily.    Social Determinants of Health    Financial Resource Strain: Not on file  Food Insecurity: Not on file  Transportation Needs: Not on file  Physical Activity: Not on file  Stress: Not on file  Social Connections: Not on file  Intimate Partner Violence: Not on file           Allergies  Allergen Reactions   Aspirin Other (See Comments)      Ringing in ears with large doses, large doses causes her to lose hearing            Outpatient Medications Prior to Visit  Medication Sig Dispense Refill   alendronate (FOSAMAX) 70 MG tablet TK 1 T PO ONCE A WEEK FOR 28 DAYS   3   aspirin 81 MG EC tablet 1 tablet       aspirin EC 81 MG EC tablet Take 1 tablet (81 mg total) by mouth daily. Swallow whole. 30 tablet 11   atorvastatin (LIPITOR) 80 MG tablet Take 1 tablet (80 mg total) by mouth daily. 30 tablet 1   atorvastatin (LIPITOR) 80 MG tablet 1 tablet       Cholecalciferol (VITAMIN D) 2000 units tablet Take 2,000 Units by mouth daily.        COVID-19 mRNA bivalent vaccine, Pfizer, (PFIZER COVID-19 VAC BIVALENT) injection Inject into the muscle. 0.3 mL 0   doxycycline (VIBRA-TABS) 100 MG tablet Take 1 po bid x 2 weeks with food 28 tablet 0   fluticasone (FLONASE) 50 MCG/ACT nasal spray SMARTSIG:1-2 Spray(s) Both Nares Daily       folic acid (FOLVITE) 1 MG tablet Take 1 mg by mouth daily.       inFLIXimab (REMICADE) 100 MG injection Inject into the vein every 8 (eight) weeks.        levocetirizine (XYZAL) 5 MG tablet Take 5 mg by mouth daily as needed for allergies.        magnesium gluconate (MAGONATE) 500 MG tablet Take 500 mg by mouth every other day.        methotrexate (RHEUMATREX) 2.5 MG tablet TK 6 TS PO WEEKLY       montelukast (SINGULAIR) 10 MG tablet Take 10 mg by mouth daily.       olopatadine (PATANOL) 0.1 % ophthalmic solution 1 drop 2 (two) times daily.       ondansetron (  ZOFRAN ODT) 4 MG disintegrating tablet Take 1 tablet (4 mg  total) by mouth every 8 (eight) hours as needed for nausea or vomiting. 20 tablet 6   pantoprazole (PROTONIX) 20 MG tablet Take 20 mg by mouth daily.       pantoprazole (PROTONIX) 40 MG tablet Take 40 mg by mouth every morning.       Polyethyl Glycol-Propyl Glycol (SYSTANE OP) Place 2 drops into both eyes as needed (for dry eyes).       Rimegepant Sulfate (NURTEC) 75 MG TBDP Take 1 tab at onset of migraine.  May repeat in 2 hrs, if needed.  Max dose: 2 tabs/day. This is a 30 day prescription. 12 tablet 11   tiZANidine (ZANAFLEX) 4 MG tablet Take 1 tablet (4 mg total) by mouth every 6 (six) hours as needed. 30 tablet 6   triamcinolone cream (KENALOG) 0.1 % Apply 1 application topically as needed (for skin irritation).       Ubrogepant (UBRELVY) 50 MG TABS 1 tablet may take second dose at least 2 hours after first dose as needed       vitamin B-12 (CYANOCOBALAMIN) 1000 MCG tablet Take 1,000 mcg by mouth daily.        No facility-administered medications prior to visit.      Review of Systems  Constitutional:  Negative for chills, fever, malaise/fatigue and weight loss.  HENT:  Negative for hearing loss, sore throat and tinnitus.   Eyes:  Negative for blurred vision and double vision.  Respiratory:  Negative for cough, hemoptysis, sputum production, shortness of breath, wheezing and stridor.   Cardiovascular:  Negative for chest pain, palpitations, orthopnea, leg swelling and PND.  Gastrointestinal:  Negative for abdominal pain, constipation, diarrhea, heartburn, nausea and vomiting.  Genitourinary:  Negative for dysuria, hematuria and urgency.  Musculoskeletal:  Negative for joint pain and myalgias.  Skin:  Negative for itching and rash.  Neurological:  Negative for dizziness, tingling, weakness and headaches.  Endo/Heme/Allergies:  Negative for environmental allergies. Does not bruise/bleed easily.  Psychiatric/Behavioral:  Negative for depression. The patient is not nervous/anxious and does  not have insomnia.   All other systems reviewed and are negative.      Objective:   General appearance: 76 y.o., female, NAD, conversant  Eyes: anicteric sclerae, moist conjunctivae; no lid-lag; PERRLA, tracking appropriately HENT: NCAT; oropharynx, MMM, no mucosal ulcerations; normal hard and soft palate Neck: Trachea midline; FROM, supple, lymphadenopathy, no JVD Lungs: CTAB, no crackles, no wheeze, with normal respiratory effort and no intercostal retractions CV: RRR, S1, S2, no MRGs  Abdomen: Soft, non-tender; non-distended, BS present  Extremities: No peripheral edema, radial and DP pulses present bilaterally  Skin: Normal temperature, turgor and texture; no rash Psych: Appropriate affect Neuro: Alert and oriented to person and place, no focal deficit     BP (!) 168/95    Pulse 93    Temp 98.3 F (36.8 C) (Oral)    Resp 17    Ht 5\' 2"  (1.575 m)    Wt 50.8 kg    SpO2 100%    BMI 20.49 kg/m       CBC Labs (Brief)          Component Value Date/Time    WBC 10.6 (H) 12/26/2020 1242    WBC 10.5 12/26/2020 1242    RBC 3.99 12/26/2020 1242    RBC 3.99 12/26/2020 1242    HGB 11.5 (L) 12/26/2020 1242    HGB 11.6 (L) 12/26/2020 1242  HCT 34.7 (L) 12/26/2020 1242    HCT 34.9 (L) 12/26/2020 1242    PLT 247 12/26/2020 1242    PLT 259 12/26/2020 1242    MCV 87.0 12/26/2020 1242    MCV 87.5 12/26/2020 1242    MCH 28.8 12/26/2020 1242    MCH 29.1 12/26/2020 1242    MCHC 33.1 12/26/2020 1242    MCHC 33.2 12/26/2020 1242    RDW 13.1 12/26/2020 1242    RDW 13.0 12/26/2020 1242    LYMPHSABS 1.3 12/26/2020 1242    MONOABS 1.1 (H) 12/26/2020 1242    EOSABS 0.1 12/26/2020 1242    BASOSABS 0.0 12/26/2020 1242        Chest Imaging: May 22nd 2022: Coronary CT imaging: A portion of the left lower lobe observed on CT imaging reveals a 3.6 cm left lower lobe mass. The patient's images have been independently reviewed by me.     03/28/2021 nuclear medicine pet imaging: Low-level  hypermetabolic uptake within the left lower lobe lesion. The patient's images have been independently reviewed by me.     Pulmonary Functions Testing Results: PFT Results Latest Ref Rng & Units 11/23/2015  FVC-Pre L 3.17  FVC-Predicted Pre % 114  FVC-Post L 3.05  FVC-Predicted Post % 110  Pre FEV1/FVC % % 83  Post FEV1/FCV % % 83  FEV1-Pre L 2.62  FEV1-Predicted Pre % 126  FEV1-Post L 2.54  DLCO uncorrected ml/min/mmHg 18.57  DLCO UNC% % 82  DLCO corrected ml/min/mmHg 18.04  DLCO COR %Predicted % 79  DLVA Predicted % 83      FeNO:    Pathology:    Echocardiogram:    Heart Catheterization:     Assessment & Plan:        ICD-10-CM    1. Lung nodule  R91.1 Pulmonary Function Test      Ambulatory referral to Cardiothoracic Surgery     2. Non-smoker  Z78.9 Pulmonary Function Test      Ambulatory referral to Cardiothoracic Surgery     3. Rheumatoid arthritis involving multiple sites, unspecified whether rheumatoid factor present ([place])  M06.9             Discussion:   This is a 76 year old female, history of rheumatoid arthritis on Remicade infusion, non-smoker.  Found to have a left lower lobe pulmonary nodule that dates back since 2017.  It showed some slow enlargement to 2018 underwent navigational bronchoscopy tissue sampling that was negative.  Patient was lost to follow-up.  She had repeat imaging in 2022 that showed this nodule had enlarged now at 3.6 cm.  We underwent nuclear medicine pet imaging which shows low-level PET uptake.  Plan: Planned bronchoscopy today for RAB + RAT  Discussed risks benefits and alternatives Patient is agreeable to proceed  [name], DO [facility] 05/24/2021 10:57 AM

## 2021-05-24 NOTE — H&P (Signed)
[address]       [place] [address]             [phone]                                                   Kendra Taylor [place] Medical Record #[id number] Date of Birth: Nov 06, 1945   Referring: Kendra Nash, DO Primary Care: Kendra Pretty, MD Primary Cardiologist: None   Chief Complaint:        Chief Complaint  Patient presents with   Lung Lesion      Surgical consult, PET Scan 03/28/21, PFT's 04/09/21     No changes since clinic appointment Vitals:   05/24/21 1010  BP: (!) 168/95  Pulse: 93  Resp: 17  Temp: 98.3 F (36.8 C)  SpO2: 100%   Alert NAD EWOB  OR today for L RATS possible LLLectomy  Per my clinic note  History of Present Illness:    Kendra Taylor 76 y.o. female referred by Dr. Valeta Taylor for surgical evaluation of a 3.6 cm left lower lobe pulmonary mass.  She was originally seen in 2017 for small nodule that was biopsied via navigational bronchoscopy with results were negative.  She subsequently was lost to follow-up before he presents now with an enlarging mass.   She has a history of rheumatoid arthritis and has been on Remicade and methotrexate.  She also had a TIA in the summer of this year, but has no residual deficits.  She has a history of migraines.  She denies any shortness of breath.       Smoking Hx: Lifelong non-smoker.     Zubrod Score: At the time of surgery this patients most appropriate activity status/level should be described as: [x]     0    Normal activity, no symptoms []     1    Restricted in physical strenuous activity but ambulatory, able to do out light work []     2    Ambulatory and capable of self care, unable to do work activities, up and about               >50 % of waking hours                              []     3    Only limited self care, in bed greater than 50% of waking hours []     4    Completely disabled, no self care, confined to bed or chair []     5    Moribund          Past Medical History:  Diagnosis Date   Anemia     Anxiety     Arthritis     Atypical mole 01/08/2013    right inner forearm   Basal cell carcinoma 03/12/2017    right concha exc mohs   Depression     Dyspnea     GERD (gastroesophageal reflux disease)     HA (headache)     Migraine     Sleep apnea      cpap           Past Surgical History:  Procedure Laterality Date   ABDOMINAL HYSTERECTOMY  BUNIONECTOMY        both   FINGER ARTHROPLASTY Right 07/30/2012    Procedure: RIGHT INDEX FINGER, MIDDLE FINGER, RING FINGER, SMALL FINGER MCP ARTHROPLASTY WITH REALIGNMENT/REPAIR OF EXTENSOR TENDON;  Surgeon: Kendra Kaufman, MD;  Location: [facility];  Service: Orthopedics;  Laterality: Right;   FINGER SURGERY       HAMMER TOE SURGERY Left 03/03/2014    Procedure: SECOND THROUGH FIFTH HAMMERTOE CORRECTION;  Surgeon: Kendra Simmer, MD;  Location: [facility];  Service: Orthopedics;  Laterality: Left;   JOINT REPLACEMENT       METATARSAL HEAD EXCISION Left 03/03/2014    Procedure: LEFT SECOND THROUGH FIFTH METATARSAL HEAD EXCISION;  Surgeon: Kendra Simmer, MD;  Location: [facility];  Service: Orthopedics;  Laterality: Left;   OOPHORECTOMY       VIDEO BRONCHOSCOPY Bilateral 12/18/2016    Procedure: VIDEO BRONCHOSCOPY WITH FLUORO;  Surgeon: Kendra Rockers, MD;  Location: [facility];  Service: Cardiopulmonary;  Laterality: Bilateral;   VIDEO BRONCHOSCOPY WITH ENDOBRONCHIAL NAVIGATION Left 02/26/2017    Procedure: VIDEO BRONCHOSCOPY WITH ENDOBRONCHIAL NAVIGATION;  Surgeon: Kendra Gobble, MD;  [facility];  Service: Thoracic;  Laterality: Left;           Family History  Problem Relation Age of Onset   Dementia Mother     Sleep apnea Mother     Hyperthyroidism Mother     Dementia Father     Sleep apnea Father          Social History       Tobacco Use  Smoking Status Never  Smokeless Tobacco Never    Social History        Substance and Sexual  Activity  Alcohol Use No   Alcohol/week: 0.0 standard drinks             Allergies  Allergen Reactions   Aspirin Other (See Comments)      Ringing in ears with large doses, large doses causes her to lose hearing            Current Outpatient Medications  Medication Sig Dispense Refill   alendronate (FOSAMAX) 70 MG tablet TK 1 T PO ONCE A WEEK FOR 28 DAYS   3   aspirin 81 MG EC tablet 1 tablet       aspirin EC 81 MG EC tablet Take 1 tablet (81 mg total) by mouth daily. Swallow whole. 30 tablet 11   atorvastatin (LIPITOR) 80 MG tablet Take 1 tablet (80 mg total) by mouth daily. 30 tablet 1   atorvastatin (LIPITOR) 80 MG tablet 1 tablet       Cholecalciferol (VITAMIN D) 2000 units tablet Take 2,000 Units by mouth daily.        COVID-19 mRNA bivalent vaccine, Pfizer, (PFIZER COVID-19 VAC BIVALENT) injection Inject into the muscle. 0.3 mL 0   doxycycline (VIBRA-TABS) 100 MG tablet Take 1 po bid x 2 weeks with food 28 tablet 0   fluticasone (FLONASE) 50 MCG/ACT nasal spray SMARTSIG:1-2 Spray(s) Both Nares Daily       folic acid (FOLVITE) 1 MG tablet Take 1 mg by mouth daily.       inFLIXimab (REMICADE) 100 MG injection Inject into the vein every 8 (eight) weeks.        levocetirizine (XYZAL) 5 MG tablet Take 5 mg by mouth daily as needed for allergies.        magnesium gluconate (MAGONATE) 500 MG tablet Take 500 mg by mouth  every other day.        methotrexate (RHEUMATREX) 2.5 MG tablet TK 6 TS PO WEEKLY       montelukast (SINGULAIR) 10 MG tablet Take 10 mg by mouth daily.       olopatadine (PATANOL) 0.1 % ophthalmic solution 1 drop 2 (two) times daily.       ondansetron (ZOFRAN ODT) 4 MG disintegrating tablet Take 1 tablet (4 mg total) by mouth every 8 (eight) hours as needed for nausea or vomiting. 20 tablet 6   pantoprazole (PROTONIX) 20 MG tablet Take 20 mg by mouth daily.       pantoprazole (PROTONIX) 40 MG tablet Take 40 mg by mouth every morning.       Polyethyl Glycol-Propyl  Glycol (SYSTANE OP) Place 2 drops into both eyes as needed (for dry eyes).       Rimegepant Sulfate (NURTEC) 75 MG TBDP Take 1 tab at onset of migraine.  May repeat in 2 hrs, if needed.  Max dose: 2 tabs/day. This is a 30 day prescription. 12 tablet 11   tiZANidine (ZANAFLEX) 4 MG tablet Take 1 tablet (4 mg total) by mouth every 6 (six) hours as needed. 30 tablet 6   triamcinolone cream (KENALOG) 0.1 % Apply 1 application topically as needed (for skin irritation).       Ubrogepant (UBRELVY) 50 MG TABS 1 tablet may take second dose at least 2 hours after first dose as needed       vitamin B-12 (CYANOCOBALAMIN) 1000 MCG tablet Take 1,000 mcg by mouth daily.        No current facility-administered medications for this visit.      Review of Systems  Constitutional:  Positive for weight loss. Negative for malaise/fatigue.  Respiratory:  Negative for cough and shortness of breath.   Cardiovascular:  Positive for leg swelling. Negative for chest pain.  Musculoskeletal:  Positive for joint pain.  Neurological: Negative.       PHYSICAL EXAMINATION: BP (!) 169/77 (BP Location: Right Arm, Patient Position: Sitting)    Pulse 81    Resp 20    Ht 5\' 3"  (1.6 m)    Wt 118 lb (53.5 kg)    SpO2 99% Comment: RA   BMI 20.90 kg/m  Physical Exam Constitutional:      General: She is not in acute distress.    Appearance: Normal appearance. She is not ill-appearing.  HENT:     Head: Normocephalic and atraumatic.  Eyes:     Extraocular Movements: Extraocular movements intact.  Cardiovascular:     Rate and Rhythm: Normal rate.  Pulmonary:     Effort: Pulmonary effort is normal. No respiratory distress.  Abdominal:     General: Abdomen is flat. There is no distension.  Musculoskeletal:        General: Normal range of motion.     Cervical back: Normal range of motion.  Skin:    General: Skin is warm and dry.  Neurological:     General: No focal deficit present.     Mental Status: She is alert and  oriented to person, place, and time.      Diagnostic Studies & Laboratory data:     Recent Radiology Findings:    Imaging Results  NM PET Image Initial (PI) Skull Base To Thigh (F-18 FDG)   Result Date: 03/29/2021 CLINICAL DATA:  Initial treatment strategy for left lower lobe pulmonary lesion. EXAM: NUCLEAR MEDICINE PET SKULL BASE TO THIGH TECHNIQUE: 5.9 mCi F-18  FDG was injected intravenously. Full-ring PET imaging was performed from the skull base to thigh after the radiotracer. CT data was obtained and used for attenuation correction and anatomic localization. Fasting blood glucose: 89 mg/dl COMPARISON:  Chest CT from 2018 FINDINGS: Mediastinal blood pool activity: SUV max 2.40 Liver activity: SUV max NA NECK: No hypermetabolic lymph nodes in the neck. Incidental CT findings: none CHEST: The irregular partially solid left lower lobe lesion is mildly hypermetabolic with SUV max of 3.78. It may be slightly more nodular when compared to the prior study and repeat bronchoscopic biopsy may be indicated. No other pulmonary lesions are identified. No enlarged or hypermetabolic mediastinal or hilar lymph nodes. There is a 5 mm left axillary node posteriorly which is mildly hypermetabolic with SUV max of 5.88 but this is most likely reactive. Incidental CT findings: Stable underlying emphysematous changes. No pleural effusions. ABDOMEN/PELVIS: No findings for abdominal/pelvic metastatic disease. No enlarged or hypermetabolic lymph nodes. Incidental CT findings: Minimal vascular calcifications for age. SKELETON: No significant osseous findings. Incidental CT findings: none IMPRESSION: 1. Stable to very slightly larger left lower lobe lung lesion demonstrating mild hypermetabolism. Recommend either follow-up CT imaging in 3-6 months or possibly repeat endoscopic biopsy. 2. No other significant findings. Electronically Signed   By: Marijo Sanes M.D.   On: 03/29/2021 17:03           I have independently  reviewed the above radiology studies  and reviewed the findings with the patient.    Recent Lab Findings: Recent Labs       Lab Results  Component Value Date    WBC 10.6 (H) 12/26/2020    WBC 10.5 12/26/2020    HGB 11.5 (L) 12/26/2020    HGB 11.6 (L) 12/26/2020    HCT 34.7 (L) 12/26/2020    HCT 34.9 (L) 12/26/2020    PLT 247 12/26/2020    PLT 259 12/26/2020    GLUCOSE 89 12/26/2020    CHOL 182 12/27/2020    TRIG 83 12/27/2020    HDL 55 12/27/2020    LDLCALC 110 (H) 12/27/2020    NA 137 12/26/2020    K 3.9 12/26/2020    CL 99 12/26/2020    CREATININE 0.81 12/26/2020    BUN 18 12/26/2020    CO2 30 12/26/2020    HGBA1C 5.3 12/27/2020          PFTs:   - FVC: 128% - FEV1: 130% -DLCO: 71%   Problem List: 3.6 cm left lower lobe pulmonary mass.  SUV uptake of 2.96.     Assessment / Plan:   76 year old female with 3.6cm left lower lobe pulmonary mass concerning for primary lung cancer.  Acceptable pulmonary function testing.  We discussed several options including CT-guided biopsy versus navigational bronchoscopy to obtain a tissue diagnosis.  She ultimately decided to undergo a combination procedure which will include a navigational bronchoscopy performed by Dr. Valeta Taylor followed by a left robotic assisted thoracoscopy with left lower lobe wedge resection and possible lobectomy.  She would like of this procedure is to take place in January 2023.

## 2021-05-24 NOTE — Anesthesia Procedure Notes (Signed)
Procedure Name: Intubation Date/Time: 05/24/2021 1:36 PM Performed by: Glynda Jaeger, CRNA Pre-anesthesia Checklist: Patient identified, Patient being monitored, Timeout performed, Emergency Drugs available and Suction available Patient Re-evaluated:Patient Re-evaluated prior to induction Oxygen Delivery Method: Circle System Utilized Preoxygenation: Pre-oxygenation with 100% oxygen Induction Type: IV induction Ventilation: Mask ventilation without difficulty Laryngoscope Size: Mac and 3 Grade View: Grade I Tube type: Oral Endobronchial tube: Double lumen EBT and 35 Fr Number of attempts: 1 Airway Equipment and Method: Stylet Placement Confirmation: ETT inserted through vocal cords under direct vision, positive ETCO2 and breath sounds checked- equal and bilateral Tube secured with: Tape Dental Injury: Teeth and Oropharynx as per pre-operative assessment

## 2021-05-24 NOTE — Transfer of Care (Signed)
Immediate Anesthesia Transfer of Care Note  Patient: Kendra Taylor  Procedure(s) Performed: ROBOTIC ASSISTED NAVIGATIONAL BRONCHOSCOPY BRONCHIAL BIOPSIES VIDEO BRONCHOSCOPY WITH RADIAL ENDOBRONCHIAL ULTRASOUND BRONCHIAL BRUSHINGS BRONCHIAL NEEDLE ASPIRATION BIOPSIES FIDUCIAL DYE MARKING XI ROBOTIC ASSISTED THORASCOPY-LEFT LOWER LOBECTOMY (Left: Chest) INTERCOSTAL NERVE BLOCK (Left: Chest) LYMPH NODE DISSECTION (Left: Chest)  Patient Location: PACU  Anesthesia Type:General  Level of Consciousness: awake, alert  and oriented  Airway & Oxygen Therapy: Patient Spontanous Breathing and Patient connected to face mask oxygen  Post-op Assessment: Report given to RN and Post -op Vital signs reviewed and stable  Post vital signs: Reviewed and stable  Last Vitals:  Vitals Value Taken Time  BP 170/101 05/24/21 1631  Temp    Pulse 78 05/24/21 1636  Resp 13 05/24/21 1636  SpO2 100 % 05/24/21 1636  Vitals shown include unvalidated device data.  Last Pain:  Vitals:   05/24/21 1015  TempSrc:   PainSc: 0-No pain         Complications: No notable events documented.

## 2021-05-24 NOTE — Brief Op Note (Signed)
05/24/2021  8:10 AM  PATIENT:  Laurin [employer]  76 y.o. female  PRE-OPERATIVE DIAGNOSIS:  Pulmonary mass  POST-OPERATIVE DIAGNOSIS:  Pulmonary mass  PROCEDURE:  Procedure(s):  XI ROBOTIC ASSISTED THORASCOPY-  LEFT LOWER LOBECTOMY (Left)  INTERCOSTAL NERVE BLOCK (Left)  LYMPH NODE DISSECTION (Left)  LYSIS OF ADHESIONS  SURGEON:  Surgeon(s) and Role:    * Lightfoot, Lucile Crater, MD - Primary  PHYSICIAN ASSISTANT: Edwing Figley PA-C, Myron Roddenberry PA-C  ASSISTANTS: none   ANESTHESIA:   general  EBL:  50 mL   BLOOD ADMINISTERED:none  DRAINS:  Left Pleural Chest Tube    LOCAL MEDICATIONS USED:  BUPIVICAINE   SPECIMEN:  Source of Specimen:  Left Lower Lobe, Lymph Nodes  DISPOSITION OF SPECIMEN:  PATHOLOGY  COUNTS:  YES  TOURNIQUET:  * No tourniquets in log *  DICTATION: .Dragon Dictation  PLAN OF CARE: Admit to inpatient   PATIENT DISPOSITION:  PACU - hemodynamically stable.   Delay start of Pharmacological VTE agent (>24hrs) due to surgical blood loss or risk of bleeding: no

## 2021-05-24 NOTE — Anesthesia Postprocedure Evaluation (Signed)
Anesthesia Post Note  Patient: Kendra Taylor  Procedure(s) Performed: ROBOTIC ASSISTED NAVIGATIONAL BRONCHOSCOPY BRONCHIAL BIOPSIES VIDEO BRONCHOSCOPY WITH RADIAL ENDOBRONCHIAL ULTRASOUND BRONCHIAL BRUSHINGS BRONCHIAL NEEDLE ASPIRATION BIOPSIES FIDUCIAL DYE MARKING XI ROBOTIC ASSISTED THORASCOPY-LEFT LOWER LOBECTOMY (Left: Chest) INTERCOSTAL NERVE BLOCK (Left: Chest) LYMPH NODE DISSECTION (Left: Chest)     Patient location during evaluation: PACU Anesthesia Type: General Level of consciousness: awake and alert, patient cooperative and oriented Pain management: pain level controlled Vital Signs Assessment: post-procedure vital signs reviewed and stable Respiratory status: spontaneous breathing, nonlabored ventilation, respiratory function stable and patient connected to nasal cannula oxygen Cardiovascular status: blood pressure returned to baseline and stable Postop Assessment: no apparent nausea or vomiting Anesthetic complications: no   No notable events documented.  Last Vitals:  Vitals:   05/24/21 1730 05/24/21 1739  BP:  (!) 169/97  Pulse: 85 84  Resp: 18 16  Temp:    SpO2: 100% 100%    Last Pain:  Vitals:   05/24/21 1630  TempSrc:   PainSc: Asleep                 Khyla Mccumbers,E. Ashely Goosby

## 2021-05-24 NOTE — Hospital Course (Addendum)
History of Present Illness:  Kendra Taylor is a 76 yo female with history of rheumatoid athritis, HTN, and TIA.  She has a known pulmonary nodule diagnosed in 2017.  She underwent navigation bronchoscopy and the results were negative.  She also underwent navigational biopsy of a left lower lobe nodule in 2018 also which showed no evidence of malignancy.  She has not followed up since that time.  She recently underwent a coronary CT scan in May of last year.  This showed a 3.6 cm x 2.4 cm mass on the left lung.  This had increased in size since the scan obtained in 2018.  She underwent evaluation by Dr. Valeta Harms who obtained a PET CT scan which showed a low level metabolic uptake, without associated adenopathy.  It was felt patient should again undergo repeat navigational biopsy with robotic assistance.  If diagnosis could not be obtained it was felt the patient should undergo robotic assisted surgical resection.  She was evaluated by Dr. Kipp Brood who was in agreement with surgical resection.  The risks and benefits of the procedure were explained to the patient and she was agreeable to proceed.  Hospital Course:  Kendra Taylor presented to [facility] on 05/24/2021.  She underwent Flexible video fiberoptic bronchoscopy with robotic assistance and biopsies.  This was unfortunately again non-diagnostic.  She was transported to the operating room and underwent Robotic Assisted Left Video Assisted Thoracoscopy with Left Lower Lobectomy, Lymph Node Dissection, and intercostal nerve block.  She tolerated the procedure without difficulty, was extubated, and taken to the PACU in stable condition.  The patient had high chest tube output overnight of 1000 ml of bloody drainage.  Her CXR was without effusion.  There was also no evidence of significant pneumothorax.  Her hemoglobin level dropped to 5.8.  She was transfused 2 units of PRBC.  She also had an elevated creatinine level of 1.46.  Toradol was  discontinued.  Repeat labs showed hgb level of 7.0.  Her chest tube output remained high.  Her coag studies were WNL.  CXR showed a new small left pleural effusion.  She was transfused 2 additional units of PRBCs.  She was treated with 2 units of FFP.  Her creatinine level further increased to 2.98 consistent with worsening AKI.  She was started on IV fluids.  Foley catheter was placed for closer monitoring of urinary output.  It was felt she should be transferred back to the SICU for closer monitoring.  The patient's chest tube output significantly decreased.  Her CXR remained free of pleural effusion.  Her creatinine level improved to 1.61.  Her hemoglobin level was stable at 9.0.  She was hypertensive and started on Lopressor for control.  She was felt to be stable and transferred back to the progressive care unit on 05/27/2021.  The patient's renal function continued to improve.  Her creatinine level had returned to 1.12 on 05/28/2021.  The patients chest tube output was decreasing.  Her chest tube was placed back on water seal.  CXR showed trace apical space which was stable.  There was no significant pleural effusion.  She remained hypertensive and was resumed on home Maxzide.  The patient's chest tube output decreased.  Her chest tube was removed on 05/29/2021.  Follow up CXR showed no pleural effusion or pneumothorax.  The patient is ambulating independently.  Her surgical incisions are healing without evidence of infection.  She is medically stable for discharge home today.

## 2021-05-24 NOTE — Anesthesia Procedure Notes (Addendum)
Procedure Name: Intubation Date/Time: 05/24/2021 12:26 PM Performed by: Lowella Dell, CRNA Pre-anesthesia Checklist: Patient identified, Emergency Drugs available, Suction available and Patient being monitored Patient Re-evaluated:Patient Re-evaluated prior to induction Oxygen Delivery Method: Circle System Utilized Preoxygenation: Pre-oxygenation with 100% oxygen Induction Type: IV induction Ventilation: Mask ventilation without difficulty Laryngoscope Size: Mac and 3 Grade View: Grade I Tube type: Oral Tube size: 8.5 mm Number of attempts: 1 Airway Equipment and Method: Stylet and Oral airway Placement Confirmation: ETT inserted through vocal cords under direct vision, positive ETCO2 and breath sounds checked- equal and bilateral Secured at: 22 cm Tube secured with: Tape Dental Injury: Teeth and Oropharynx as per pre-operative assessment  Comments: Performed by Donia Ast, SRNA

## 2021-05-25 ENCOUNTER — Encounter (HOSPITAL_COMMUNITY): Payer: Self-pay | Admitting: Thoracic Surgery (Cardiothoracic Vascular Surgery)

## 2021-05-25 ENCOUNTER — Inpatient Hospital Stay (HOSPITAL_COMMUNITY): Payer: MEDICARE

## 2021-05-25 LAB — CBC
HCT: 17.4 % — ABNORMAL LOW (ref 36.0–46.0)
HCT: 17.3 % — ABNORMAL LOW (ref 36.0–46.0)
Hemoglobin: 5.9 g/dL — CL (ref 12.0–15.0)
Hemoglobin: 5.8 g/dL — CL (ref 12.0–15.0)
MCH: 29.1 pg (ref 26.0–34.0)
MCH: 28.9 pg (ref 26.0–34.0)
MCHC: 33.9 g/dL (ref 30.0–36.0)
MCHC: 33.5 g/dL (ref 30.0–36.0)
MCV: 85.7 fL (ref 80.0–100.0)
MCV: 86.1 fL (ref 80.0–100.0)
Platelets: 219 10*3/uL (ref 150–400)
Platelets: 241 10*3/uL (ref 150–400)
RBC: 2.03 MIL/uL — ABNORMAL LOW (ref 3.87–5.11)
RBC: 2.01 MIL/uL — ABNORMAL LOW (ref 3.87–5.11)
RDW: 12.7 % (ref 11.5–15.5)
RDW: 12.8 % (ref 11.5–15.5)
WBC: 15.1 10*3/uL — ABNORMAL HIGH (ref 4.0–10.5)
WBC: 16.2 10*3/uL — ABNORMAL HIGH (ref 4.0–10.5)
nRBC: 0 % (ref 0.0–0.2)
nRBC: 0 % (ref 0.0–0.2)

## 2021-05-25 LAB — BASIC METABOLIC PANEL
Anion gap: 9 (ref 5–15)
BUN: 23 mg/dL (ref 8–23)
CO2: 28 mmol/L (ref 22–32)
Calcium: 7.8 mg/dL — ABNORMAL LOW (ref 8.9–10.3)
Chloride: 97 mmol/L — ABNORMAL LOW (ref 98–111)
Creatinine, Ser: 1.43 mg/dL — ABNORMAL HIGH (ref 0.44–1.00)
GFR, Estimated: 38 mL/min — ABNORMAL LOW (ref >60)
Glucose, Bld: 177 mg/dL — ABNORMAL HIGH (ref 70–99)
Potassium: 3.4 mmol/L — ABNORMAL LOW (ref 3.5–5.1)
Sodium: 134 mmol/L — ABNORMAL LOW (ref 135–145)

## 2021-05-25 LAB — PROTIME-INR
INR: 1.1 (ref 0.8–1.2)
Prothrombin Time: 14 seconds (ref 11.4–15.2)

## 2021-05-25 LAB — PREPARE RBC (CROSSMATCH)

## 2021-05-25 LAB — FIBRINOGEN: Fibrinogen: 432 mg/dL (ref 210–475)

## 2021-05-25 LAB — APTT: aPTT: 37 seconds — ABNORMAL HIGH (ref 24–36)

## 2021-05-25 MED ORDER — SODIUM CHLORIDE 0.9% IV SOLUTION: Freq: Once | INTRAVENOUS | Status: AC

## 2021-05-25 NOTE — Progress Notes (Addendum)
° °   °  [address]       [place] [address]             [phone]      1 Day Post-Op Procedure(s) (LRB): XI ROBOTIC ASSISTED THORASCOPY-LEFT LOWER LOBECTOMY (Left) INTERCOSTAL NERVE BLOCK (Left) LYMPH NODE DISSECTION (Left)  Subjective:  Patient trying to sit up on side of bed and eat breakfast.  Denies pain, shortness of breath.  Objective: Vital signs in last 24 hours: Temp:  [97 F (36.1 C)-98.3 F (36.8 C)] 97.8 F (36.6 C) (01/27 0610) Pulse Rate:  [80-101] 96 (01/27 0610) Cardiac Rhythm: Normal sinus rhythm (01/27 0712) Resp:  [13-24] 24 (01/27 0610) BP: (90-179)/(59-101) 102/60 (01/27 0610) SpO2:  [95 %-100 %] 95 % (01/27 0610) Weight:  [50.8 kg] 50.8 kg (01/26 1010)  Intake/Output from previous day: 01/26 0701 - 01/27 0700 In: 2810 [P.O.:360; I.V.:2150; IV Piggyback:300] Out: 510 [Urine:300; Blood:50; Chest Tube:160]  General appearance: alert, cooperative, and no distress Heart: regular rate and rhythm Lungs: clear to auscultation bilaterally Abdomen: soft, non-tender; bowel sounds normal; no masses,  no organomegaly Extremities: extremities normal, atraumatic, no cyanosis or edema Wound: clean, some swelling around posterior port site  Lab Results: Recent Labs    05/25/21 0100 05/25/21 0242  WBC 15.1* 16.2*  HGB 5.9* 5.8*  HCT 17.4* 17.3*  PLT 219 241   BMET:  Recent Labs    05/22/21 1024 05/25/21 0100  NA 131* 134*  K 3.6 3.4*  CL 96* 97*  CO2 25 28  GLUCOSE 92 177*  BUN 23 23  CREATININE 1.25* 1.43*  CALCIUM 8.7* 7.8*    PT/INR:  Recent Labs    05/22/21 1024  LABPROT 13.3  INR 1.0   ABG    Component Value Date/Time   PHART 7.444 05/22/2021 1047   HCO3 27.1 05/22/2021 1047   O2SAT 98.8 05/22/2021 1047   CBG (last 3)  No results for input(s): GLUCAP in the last 72 hours.  Assessment/Plan: S/P Procedure(s) (LRB): XI ROBOTIC ASSISTED THORASCOPY-LEFT LOWER LOBECTOMY (Left) INTERCOSTAL NERVE BLOCK  (Left) LYMPH NODE DISSECTION (Left)  CV- Sinus Tacycardia, labile BP- hold Maxide for now Pulm- CT output 1000 of bloody output, CXR looks good w/o effusion, pneumothorax Renal-creatinine up to 1.46, suspect due to volume depletion.. Hgb down to 5.8  Post operative blood loss anemia, Hgb at 5.8, CT output is high, CXR doesn't show effusion, concerned she could be bleeding into her sub q tissue at her posterior port site.. she may require re-exploration.... 2 units of blood have been ordered for transfusion.. coags being checked D/C Lovenox for now, can resume once Hgb/bleeding is stabilized Dispo- patient stable, CT w/o air leak on waterseal.. output is high at 1000 cc of bloody drainage with drop in Hgb to 5.8.Marland Kitchen 2 units of blood have been ordered.. CXR w/o significant effusion, may be bleeding from posterior port site...may require re-exploration.. Dr. Kipp Brood to evaluate   LOS: 1 day    Kendra Handler, PA-C 05/25/2021  Significant chest tube output overnight. Receiving 2 units of blood. However is now mostly serosanguineous.  We will continue to check output to determine if patient needs reoperation.  Kendra Taylor

## 2021-05-25 NOTE — Progress Notes (Signed)
Pt Hgb 5.9- contacted Dr Roxan Hockey with this information. Verbal order to have Hgb repeated stat

## 2021-05-25 NOTE — Discharge Summary (Signed)
Physician Discharge Summary  Patient ID: Kendra Taylor MRN: [id number] DOB/AGE: [birth year] 76 y.o.  Admit date: 05/24/2021 Discharge date: 05/30/2021  Admission Diagnoses:  Patient Active Problem List   Diagnosis Date Noted   Vision changes 01/08/2021   Chronic migraine w/o aura w/o status migrainosus, not intractable 01/08/2021   Stroke-like symptoms 12/26/2020   Esophageal reflux 12/26/2020   Anxiety 12/26/2020   Obstructive sleep apnea 12/26/2020   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   Precordial pain    Solitary pulmonary nodule 12/18/2016   LPRD (laryngopharyngeal reflux disease) 02/26/2016   Cough 08/23/2015   Upper airway cough syndrome with classic voice fatigue >> doe  08/23/2015   Bronchiectasis without acute exacerbation ([place]) 08/23/2015   HA (headache)    Rheumatoid arthritis ([place]) 12/27/2013   Discharge Diagnoses:   Patient Active Problem List   Diagnosis Date Noted   S/P Robotic [place] Video Assisted Thoracoscopy with Left Lower Lobectomy 05/24/2021   Vision changes 01/08/2021   Chronic migraine w/o aura w/o status migrainosus, not intractable 01/08/2021   Stroke-like symptoms 12/26/2020   Esophageal reflux 12/26/2020   Anxiety 12/26/2020   Obstructive sleep apnea 12/26/2020   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   Precordial pain    Solitary pulmonary nodule 12/18/2016   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  She has a known pulmonary nodule diagnosed in 2017.  She underwent navigation bronchoscopy and the results were negative.  She also underwent  navigational biopsy of a left lower lobe nodule in 2018 also which showed no evidence of malignancy.  She has not followed up since that time.  She recently underwent a coronary CT scan in May of last year.  This showed a 3.6 cm x 2.4 cm mass on the left lung.  This had increased in size since the scan obtained in 2018.  She underwent evaluation by Dr. Valeta Harms who obtained a PET CT scan which showed a low level metabolic uptake, without associated adenopathy.  It was felt patient should again undergo repeat navigational biopsy with robotic assistance.  If diagnosis could not be obtained it was felt the patient should undergo robotic assisted surgical resection.  She was evaluated by Dr. Kipp Brood who was in agreement with surgical resection.  The risks and benefits of the procedure were explained to the patient and she was agreeable to proceed.  Hospital Course:  Kendra Taylor presented to [facility] on 05/24/2021.  She underwent Flexible video fiberoptic bronchoscopy with robotic assistance and biopsies.  This was unfortunately again non-diagnostic.  She was transported to the operating room and underwent Robotic Assisted Left Video Assisted Thoracoscopy with Left Lower Lobectomy, Lymph Node Dissection, and intercostal nerve block.  She tolerated the procedure without difficulty, was extubated, and taken to the PACU in stable condition.  The patient had high chest tube output overnight of 1000 ml of bloody drainage.  Her CXR was without effusion.  There was also no evidence of significant pneumothorax.  Her hemoglobin level dropped to 5.8.  She was transfused 2 units of PRBC.  She also had an elevated creatinine level of 1.46.  Toradol was discontinued.  Repeat labs  showed hgb level of 7.0.  Her chest tube output remained high.  Her coag studies were WNL.  CXR showed a new small left pleural effusion.  She was transfused 2 additional units of PRBCs.  She was treated with 2 units of FFP.  Her creatinine  level further increased to 2.98 consistent with worsening AKI.  She was started on IV fluids.  Foley catheter was placed for closer monitoring of urinary output.  It was felt she should be transferred back to the SICU for closer monitoring.  The patient's chest tube output significantly decreased.  Her CXR remained free of pleural effusion.  Her creatinine level improved to 1.61.  Her hemoglobin level was stable at 9.0.  She was hypertensive and started on Lopressor for control.  She was felt to be stable and transferred back to the progressive care unit on 05/27/2021.  The patient's renal function continued to improve.  Her creatinine level had returned to 1.12 on 05/28/2021.  The patients chest tube output was decreasing.  Her chest tube was placed back on water seal.  CXR showed trace apical space which was stable.  There was no significant pleural effusion.  She remained hypertensive and was resumed on home Maxzide.  The patient's chest tube output decreased.  Her chest tube was removed on 05/29/2021.  Follow up CXR showed no pleural effusion or pneumothorax.  The patient is ambulating independently.  Her surgical incisions are healing without evidence of infection.  She is medically stable for discharge home today.  Consults: None  Significant Diagnostic Studies: nuclear medicine:   EXAM: NUCLEAR MEDICINE PET SKULL BASE TO THIGH   TECHNIQUE: 5.9 mCi F-18 FDG was injected intravenously. Full-ring PET imaging was performed from the skull base to thigh after the radiotracer. CT data was obtained and used for attenuation correction and anatomic localization.   Fasting blood glucose: 89 mg/dl   COMPARISON:  Chest CT from 2018   FINDINGS: Mediastinal blood pool activity: SUV max 2.40   Liver activity: SUV max NA   NECK: No hypermetabolic lymph nodes in the neck.   Incidental CT findings: none   CHEST: The irregular partially solid left lower lobe lesion is mildly hypermetabolic with SUV max  of 2.96. It may be slightly more nodular when compared to the prior study and repeat bronchoscopic biopsy may be indicated. No other pulmonary lesions are identified. No enlarged or hypermetabolic mediastinal or hilar lymph nodes.   There is a 5 mm left axillary node posteriorly which is mildly hypermetabolic with SUV max of 7.02 but this is most likely reactive.   Incidental CT findings: Stable underlying emphysematous changes. No pleural effusions.   ABDOMEN/PELVIS: No findings for abdominal/pelvic metastatic disease. No enlarged or hypermetabolic lymph nodes.   Incidental CT findings: Minimal vascular calcifications for age.   SKELETON: No significant osseous findings.   Incidental CT findings: none   IMPRESSION: 1. Stable to very slightly larger left lower lobe lung lesion demonstrating mild hypermetabolism. Recommend either follow-up CT imaging in 3-6 months or possibly repeat endoscopic biopsy. 2. No other significant findings.     Electronically Signed   By: Marijo Sanes M.D.   On: 03/29/2021 17:03   Treatments:   Video Bronchoscopy with Robotic Assisted Bronchoscopic Navigation    Date of Operation: 05/24/2021    Pre-op Diagnosis: Lung nodule    Post-op Diagnosis: Lung nodule   Surgeon: [name], DO    Assistants: None    Anesthesia: General endotracheal anesthesia  Operation: Flexible video fiberoptic bronchoscopy with robotic assistance and biopsies.   PATHOLOGY:  SURGICAL PATHOLOGY  CASE: [id number]  PATIENT: Marianna Payment  Surgical Pathology Report   Clinical History: pulmonary mass (cm)    FINAL MICROSCOPIC DIAGNOSIS:   A. LUNG, LEFT LOWER LOBE, LOBECTOMY:  - Adenocarcinoma, acinar predominant, 2.7 cm.  - Margins negative for carcinoma.  - No visceral pleural involvement identified.  - Six lymph nodes negative for metastatic carcinoma (0/6).   B. LYMPH NODE, LEVEL 7, EXCISION:  - One lymph node negative for metastatic  carcinoma (0/1).   C. LYMPH NODE, HILAR, EXCISION:  - One lymph node negative for metastatic carcinoma (0/1).   D. LYMPH NODE, LEVEL 9, EXCISION:  - One lymph node negative for metastatic carcinoma (0/1).   E. LYMPH NODE, HILAR #2, EXCISION:  - One lymph node negative for metastatic carcinoma (0/1).   F. LYMPH NODE, LEVEL 6, EXCISION:  - One lymph node negative for metastatic carcinoma (0/1).   Discharge Exam: Blood pressure (!) 161/88, pulse 85, temperature 98.8 F (37.1 C), temperature source Oral, resp. rate 13, height 5\' 2"  (1.575 m), weight 48.6 kg, SpO2 95 %.  General appearance: alert, cooperative, and no distress Heart: regular rate and rhythm Lungs: clear to auscultation bilaterally Abdomen: soft, non-tender; bowel sounds normal; no masses,  no organomegaly Extremities: extremities normal, atraumatic, no cyanosis or edema Wound: clean and dry, mild hematoma at one of the posterior port sites  Discharge disposition: 01-Home or Self Care  Allergies as of 05/30/2021       Reactions   Aspirin Other (See Comments)   Ringing in ears with large doses, large doses causes her to lose hearing   Ubrogepant Nausea And Vomiting   Venlafaxine    Headaches        Medication List     STOP taking these medications    doxycycline 100 MG tablet Commonly known as: VIBRA-TABS       TAKE these medications    acetaminophen 500 MG tablet Commonly known as: TYLENOL Take 500 mg by mouth every 6 (six) hours as needed.   alendronate 70 MG tablet Commonly known as: FOSAMAX Take 70 mg by mouth once a week.   aspirin 81 MG EC tablet Take 1 tablet (81 mg total) by mouth daily. Swallow whole.   atorvastatin 80 MG tablet Commonly known as: LIPITOR Take 1 tablet (80 mg total) by mouth daily.   B-12 PO Take 1 tablet by mouth daily.   cholecalciferol 25 MCG (1000 UNIT) tablet Commonly known as: VITAMIN D3 Take 1,000 Units by mouth daily.   fluticasone 50 MCG/ACT nasal  spray Commonly known as: FLONASE Place 1 spray into both nostrils daily as needed for allergies.   folic acid 1 MG tablet Commonly known as: FOLVITE Take 1 mg by mouth daily.   inFLIXimab 100 MG injection Commonly known as: REMICADE Inject into the vein every 8 (eight) weeks.   levocetirizine 5 MG tablet Commonly known as: XYZAL Take 5 mg by mouth at bedtime as needed for allergies.   Magnesium 400 MG Caps Take 400 mg by mouth every other day.   methotrexate 2.5 MG tablet Commonly known as: RHEUMATREX Take 15 mg by mouth every Thursday.   metoprolol tartrate 25 MG tablet Commonly known as: LOPRESSOR Take 1 tablet (25 mg total) by mouth 2 (two) times daily.   Nurtec 75 MG Tbdp Generic drug: Rimegepant Sulfate Take 1 tab at onset of migraine.  May repeat in  2 hrs, if needed.  Max dose: 2 tabs/day. This is a 30 day prescription.   olopatadine 0.1 % ophthalmic solution Commonly known as: PATANOL Place 1 drop into both eyes daily as needed for allergies.   ondansetron 4 MG disintegrating tablet Commonly known as: Zofran ODT Take 1 tablet (4 mg total) by mouth every 8 (eight) hours as needed for nausea or vomiting.   pantoprazole 40 MG tablet Commonly known as: PROTONIX Take 40 mg by mouth every morning.   SYSTANE OP Place 1 drop into both eyes as needed (for dry eyes).   traMADol 50 MG tablet Commonly known as: ULTRAM Take 1 tablet (50 mg total) by mouth every 6 (six) hours as needed for moderate pain.   triamcinolone cream 0.1 % Commonly known as: KENALOG Apply 1 application topically as needed (for skin irritation).   triamterene-hydrochlorothiazide 37.5-25 MG tablet Commonly known as: MAXZIDE-25 Take 1 tablet by mouth daily.               Durable Medical Equipment  (From admission, onward)           Start     Ordered   05/30/21 0734  For home use only DME Walker rolling  Once       Question Answer Comment  Walker: With 5 Inch Wheels   Patient  needs a walker to treat with the following condition S/P lobectomy of lung      05/30/21 0733            Follow-up Information     Lightfoot, Lucile Crater, MD Follow up.   Specialty: Cardiothoracic Surgery Why: Appointment is at 10:00 Contact information: [address] [place] [address] [phone]                 Signed: Ellwood Handler, PA-C  05/30/2021, 7:38 AM

## 2021-05-25 NOTE — Progress Notes (Signed)
Ambulated patient for first time after surgery at 1745, patient complained of pain at chest tube incision site, noted an area of subcutaneous emphysema upon assessment. I outlined the area and gave the patient pain medication. I called Dr.Vantright and he ordered the patients chest tube to go to suction at a rate of 20.

## 2021-05-25 NOTE — Plan of Care (Signed)
  Problem: Pain Managment: Goal: General experience of comfort will improve Outcome: Progressing   Problem: Skin Integrity: Goal: Risk for impaired skin integrity will decrease Outcome: Progressing   Problem: Safety: Goal: Ability to remain free from injury will improve Outcome: Progressing   

## 2021-05-25 NOTE — Plan of Care (Signed)

## 2021-05-26 ENCOUNTER — Inpatient Hospital Stay (HOSPITAL_COMMUNITY): Payer: MEDICARE

## 2021-05-26 LAB — COMPREHENSIVE METABOLIC PANEL
ALT: 7 U/L (ref 0–44)
AST: 23 U/L (ref 15–41)
Albumin: 2 g/dL — ABNORMAL LOW (ref 3.5–5.0)
Alkaline Phosphatase: 31 U/L — ABNORMAL LOW (ref 38–126)
Anion gap: 11 (ref 5–15)
BUN: 44 mg/dL — ABNORMAL HIGH (ref 8–23)
CO2: 25 mmol/L (ref 22–32)
Calcium: 7.4 mg/dL — ABNORMAL LOW (ref 8.9–10.3)
Chloride: 95 mmol/L — ABNORMAL LOW (ref 98–111)
Creatinine, Ser: 2.98 mg/dL — ABNORMAL HIGH (ref 0.44–1.00)
GFR, Estimated: 16 mL/min — ABNORMAL LOW (ref >60)
Glucose, Bld: 124 mg/dL — ABNORMAL HIGH (ref 70–99)
Potassium: 3.7 mmol/L (ref 3.5–5.1)
Sodium: 131 mmol/L — ABNORMAL LOW (ref 135–145)
Total Bilirubin: 0.3 mg/dL (ref 0.3–1.2)
Total Protein: 3.7 g/dL — ABNORMAL LOW (ref 6.5–8.1)

## 2021-05-26 LAB — CBC
HCT: 19.3 % — ABNORMAL LOW (ref 36.0–46.0)
HCT: 25.9 % — ABNORMAL LOW (ref 36.0–46.0)
Hemoglobin: 7 g/dL — ABNORMAL LOW (ref 12.0–15.0)
Hemoglobin: 9.1 g/dL — ABNORMAL LOW (ref 12.0–15.0)
MCH: 30.6 pg (ref 26.0–34.0)
MCH: 29.3 pg (ref 26.0–34.0)
MCHC: 36.3 g/dL — ABNORMAL HIGH (ref 30.0–36.0)
MCHC: 35.1 g/dL (ref 30.0–36.0)
MCV: 84.3 fL (ref 80.0–100.0)
MCV: 83.3 fL (ref 80.0–100.0)
Platelets: 225 10*3/uL (ref 150–400)
Platelets: 159 10*3/uL (ref 150–400)
RBC: 2.29 MIL/uL — ABNORMAL LOW (ref 3.87–5.11)
RBC: 3.11 MIL/uL — ABNORMAL LOW (ref 3.87–5.11)
RDW: 14.7 % (ref 11.5–15.5)
RDW: 14.5 % (ref 11.5–15.5)
WBC: 16 10*3/uL — ABNORMAL HIGH (ref 4.0–10.5)
WBC: 11.9 10*3/uL — ABNORMAL HIGH (ref 4.0–10.5)
nRBC: 0 % (ref 0.0–0.2)
nRBC: 0 % (ref 0.0–0.2)

## 2021-05-26 LAB — HEMOGLOBIN AND HEMATOCRIT, BLOOD
HCT: 18.9 % — ABNORMAL LOW (ref 36.0–46.0)
Hemoglobin: 6.8 g/dL — CL (ref 12.0–15.0)

## 2021-05-26 LAB — BASIC METABOLIC PANEL
Anion gap: 9 (ref 5–15)
BUN: 49 mg/dL — ABNORMAL HIGH (ref 8–23)
CO2: 26 mmol/L (ref 22–32)
Calcium: 7.4 mg/dL — ABNORMAL LOW (ref 8.9–10.3)
Chloride: 97 mmol/L — ABNORMAL LOW (ref 98–111)
Creatinine, Ser: 2.19 mg/dL — ABNORMAL HIGH (ref 0.44–1.00)
GFR, Estimated: 23 mL/min — ABNORMAL LOW (ref >60)
Glucose, Bld: 124 mg/dL — ABNORMAL HIGH (ref 70–99)
Potassium: 3.8 mmol/L (ref 3.5–5.1)
Sodium: 132 mmol/L — ABNORMAL LOW (ref 135–145)

## 2021-05-26 LAB — PREPARE RBC (CROSSMATCH)

## 2021-05-26 MED ORDER — FUROSEMIDE 10 MG/ML IJ SOLN
20.0000 mg | Freq: Once | INTRAMUSCULAR | Status: AC
  Administered 2021-05-26: 20 mg via INTRAVENOUS
  Filled 2021-05-26: qty 2

## 2021-05-26 MED ORDER — CHLORHEXIDINE GLUCONATE CLOTH 2 % EX PADS
6.0000 | MEDICATED_PAD | Freq: Every day | CUTANEOUS | Status: DC
  Administered 2021-05-26 – 2021-05-29 (×4): 6 via TOPICAL

## 2021-05-26 MED ORDER — SODIUM CHLORIDE 0.45 % IV SOLN: INTRAVENOUS | Status: DC

## 2021-05-26 MED ORDER — SODIUM CHLORIDE 0.9 % IV SOLN
6.2500 mg | INTRAVENOUS | Status: DC | PRN
  Administered 2021-05-26: 6.25 mg via INTRAVENOUS
  Filled 2021-05-26 (×2): qty 0.25

## 2021-05-26 MED ORDER — SODIUM CHLORIDE 0.9% IV SOLUTION: Freq: Once | INTRAVENOUS | Status: AC

## 2021-05-26 NOTE — Progress Notes (Addendum)
[address]       [place] [address]             [phone]      2 Days Post-Op Procedure(s) (LRB): XI ROBOTIC ASSISTED THORASCOPY-LEFT LOWER LOBECTOMY (Left) INTERCOSTAL NERVE BLOCK (Left) LYMPH NODE DISSECTION (Left)  Subjective:  Patient not doing all that well.  She is concerned about her continued chest tube output and the "stability" of her chest tube.  She has some mild nausea as well.  Objective: Vital signs in last 24 hours: Temp:  [97.7 F (36.5 C)-98.6 F (37 C)] 98.4 F (36.9 C) (01/28 0750) Pulse Rate:  [88-96] 91 (01/28 0750) Cardiac Rhythm: Normal sinus rhythm (01/28 0725) Resp:  [15-23] 16 (01/28 0750) BP: (111-130)/(56-108) 112/56 (01/28 0750) SpO2:  [91 %-99 %] 97 % (01/28 0750)  Intake/Output from previous day: 01/27 0701 - 01/28 0700 In: 888 [Blood:888] Out: 567 [Chest Tube:567] Intake/Output this shift: Total I/O In: -  Out: 220 [Chest Tube:220]  General appearance: alert, cooperative, and no distress Heart: regular rate and rhythm Lungs: diminished breath sounds left base Abdomen: soft, non-tender; bowel sounds normal; no masses,  no organomegaly Extremities: extremities normal, atraumatic, no cyanosis or edema Wound: clean and dry , swelling decreased around posterior port site  Lab Results: Recent Labs    05/25/21 0242 05/26/21 0118  WBC 16.2* 16.0*  HGB 5.8* 7.0*  HCT 17.3* 19.3*  PLT 241 225   BMET:  Recent Labs    05/25/21 0100 05/26/21 0118  NA 134* 131*  K 3.4* 3.7  CL 97* 95*  CO2 28 25  GLUCOSE 177* 124*  BUN 23 44*  CREATININE 1.43* 2.98*  CALCIUM 7.8* 7.4*    PT/INR:  Recent Labs    05/25/21 1925  LABPROT 14.0  INR 1.1   ABG    Component Value Date/Time   PHART 7.444 05/22/2021 1047   HCO3 27.1 05/22/2021 1047   O2SAT 98.8 05/22/2021 1047   CBG (last 3)  No results for input(s): GLUCAP in the last 72 hours.  Assessment/Plan: S/P Procedure(s) (LRB): XI ROBOTIC ASSISTED  THORASCOPY-LEFT LOWER LOBECTOMY (Left) INTERCOSTAL NERVE BLOCK (Left) LYMPH NODE DISSECTION (Left)  CV- NSR, BP stable- continue to hold antihypertensive agent Pulm- CT output of additional 850 cc since 10 am yesterday, CXR now with small left pleural effusion, no pneumothorax Renal- AKI, creatinine up to 2.98 I suspect this is related to blood loss, may need to get Nephrology consult, will start IV fluids Expected post operative blood loss anemia, Hgb not checked after transfusion yesterday, currently at 7.0, will order additional 2 units of blood, have also ordered H/H for this AM to see if stable from the one checked at 7 am... Coags checked yesterday were normal Dispo- patient with continued increase chest tube output, H/H is 7 on check at 1 am this morning, creatinine level is now at 2.98 suspect related of volume loss... will order 2 additional units of blood, I remain concerned that patient is having post operative bleeding with continued CT output and now new effusion on CXR, will discuss with Dr. Prescott Gum    LOS: 2 days    Ellwood Handler, PA-C 05/26/2021  Patient examined with EB and CXR reviewed  Continuous low level bleeding from chest tube without accumulation in chest consistent with oozing from port or chest tube site Will place in ICU for observation and keep npo [pt had breakfast] PRBC and FFP have been .CBC  BID Situation  and plan of carediscussed with patient and family  patient examined and medical record reviewed,agree with above note  By Lorrin Goodell 05/26/2021

## 2021-05-26 NOTE — Progress Notes (Signed)
[place], 2C RN called report.  All questions answered. 1130 - Pt arrived from [facility].  All lines verified.  Safety checks performed.  Pt given CHG bath.  Linens changed.  Skin assessment with Belenda Cruise, RN. Hand hygiene performed before/after each pt contact. 52 - Pt's wife at bedside. 1200 - 1st unit of RBCs initiated, verified by Janett Billow, RN.   1300 - Pt's wife asking about possible diet for pt.  Dr. Darcey Nora advised that he wanted to keep her NPO in case pt has to return to Sx.  She advised that she understood. 1415 - 1st unit of Plasma initiated, verified by [facility], RN. [phone] - Dr. Darcey Nora rounded. 1532 - 2nd unit of RBCs initiated, verified by [facility], RN. 1740 - 2nd unit of Plasma initiated, verified by Janett Billow, RN. [phone] - Dr. Darcey Nora rounded. 1900 - Report given to PM RN.  All questions answered.

## 2021-05-26 NOTE — Progress Notes (Signed)
CT Surgery  Chest tube output sig decreased- received 2 U PRBC and FFP Good breath sounds CT drainage more serosanguinous Follow up Hb pending after transfusion Pt placed on full liq diet  P Prescott Gum

## 2021-05-27 ENCOUNTER — Inpatient Hospital Stay (HOSPITAL_COMMUNITY): Payer: MEDICARE

## 2021-05-27 LAB — TYPE AND SCREEN
ABO/RH(D): O POS
Antibody Screen: NEGATIVE
Unit division: 0
Unit division: 0
Unit division: 0
Unit division: 0

## 2021-05-27 LAB — PREPARE FRESH FROZEN PLASMA

## 2021-05-27 LAB — BPAM FFP
Blood Product Expiration Date: 202301282359
Blood Product Expiration Date: 202301302359
ISSUE DATE / TIME: 202301281157
ISSUE DATE / TIME: 202301281459
Unit Type and Rh: 5100
Unit Type and Rh: 5100

## 2021-05-27 LAB — BPAM RBC
Blood Product Expiration Date: 202301302359
Blood Product Expiration Date: 202302172359
Blood Product Expiration Date: 202302272359
Blood Product Expiration Date: 202303012359
ISSUE DATE / TIME: 202301270545
ISSUE DATE / TIME: 202301271139
ISSUE DATE / TIME: 202301281157
ISSUE DATE / TIME: 202301281459
Unit Type and Rh: 5100
Unit Type and Rh: 5100
Unit Type and Rh: 5100
Unit Type and Rh: 9500

## 2021-05-27 LAB — CBC
HCT: 26.7 % — ABNORMAL LOW (ref 36.0–46.0)
Hemoglobin: 9.2 g/dL — ABNORMAL LOW (ref 12.0–15.0)
MCH: 28.6 pg (ref 26.0–34.0)
MCHC: 34.5 g/dL (ref 30.0–36.0)
MCV: 82.9 fL (ref 80.0–100.0)
Platelets: 153 10*3/uL (ref 150–400)
RBC: 3.22 MIL/uL — ABNORMAL LOW (ref 3.87–5.11)
RDW: 14.6 % (ref 11.5–15.5)
WBC: 10.4 10*3/uL (ref 4.0–10.5)
nRBC: 0 % (ref 0.0–0.2)

## 2021-05-27 LAB — BASIC METABOLIC PANEL
Anion gap: 8 (ref 5–15)
BUN: 42 mg/dL — ABNORMAL HIGH (ref 8–23)
CO2: 27 mmol/L (ref 22–32)
Calcium: 7.3 mg/dL — ABNORMAL LOW (ref 8.9–10.3)
Chloride: 98 mmol/L (ref 98–111)
Creatinine, Ser: 1.67 mg/dL — ABNORMAL HIGH (ref 0.44–1.00)
GFR, Estimated: 31 mL/min — ABNORMAL LOW (ref >60)
Glucose, Bld: 95 mg/dL (ref 70–99)
Potassium: 3.7 mmol/L (ref 3.5–5.1)
Sodium: 133 mmol/L — ABNORMAL LOW (ref 135–145)

## 2021-05-27 MED ORDER — TRAMADOL HCL 50 MG PO TABS
50.0000 mg | ORAL_TABLET | Freq: Four times a day (QID) | ORAL | Status: DC | PRN
Start: 2021-05-27 — End: 2021-05-30
  Administered 2021-05-28 – 2021-05-29 (×2): 50 mg via ORAL
  Filled 2021-05-27 (×2): qty 1

## 2021-05-27 MED ORDER — HYDRALAZINE HCL 20 MG/ML IJ SOLN
10.0000 mg | INTRAMUSCULAR | Status: DC | PRN
  Administered 2021-05-27 (×2): 10 mg via INTRAVENOUS
  Filled 2021-05-27 (×2): qty 1

## 2021-05-27 MED ORDER — METOPROLOL TARTRATE 12.5 MG HALF TABLET
12.5000 mg | ORAL_TABLET | Freq: Two times a day (BID) | ORAL | Status: DC
  Administered 2021-05-27 – 2021-05-29 (×6): 12.5 mg via ORAL
  Filled 2021-05-27 (×6): qty 1

## 2021-05-27 MED ORDER — FUROSEMIDE 10 MG/ML IJ SOLN: 20.0000 mg | Freq: Once | INTRAMUSCULAR | Status: DC

## 2021-05-27 MED ORDER — OXYCODONE HCL 5 MG PO TABS: 5.0000 mg | ORAL_TABLET | ORAL | Status: DC | PRN

## 2021-05-27 NOTE — Progress Notes (Signed)
°  Transition of Care [facility]) Screening Note   Patient Details  Name: Kendra Taylor Date of Birth: [birth year]   Transition of Care [facility]) CM/SW Contact:    Bartholomew Crews, RN Phone Number: [phone] 05/27/2021, 5:42 PM    Transition of Care Department [facility]) has reviewed patient and no TOC needs have been identified at this time. We will continue to monitor patient advancement through interdisciplinary progression rounds. If new patient transition needs arise, please place a TOC consult.

## 2021-05-27 NOTE — Progress Notes (Signed)
3 Days Post-Op Procedure(s) (LRB): XI ROBOTIC ASSISTED THORASCOPY-LEFT LOWER LOBECTOMY (Left) INTERCOSTAL NERVE BLOCK (Left) LYMPH NODE DISSECTION (Left) Subjective: Patient is doing much better. Chest tube output decreased to 90 cc overnight Morning hemoglobin is stable at 9 g and a.m. chest x-ray shows no accumulation of blood in the left hemithorax Her creatinine has decreased from 3.0 down to 1.6 She is back on her oral meds and regular diet. Will be transferred back to the progressive care unit She is having issues with high blood pressure and she will be started on low-dose  po metoprolol with prn IV Apresoline--she takes no blood pressure medication at home.  Objective: Vital signs in last 24 hours: Temp:  [98.4 F (36.9 C)-98.9 F (37.2 C)] 98.4 F (36.9 C) (01/29 0727) Pulse Rate:  [75-105] 89 (01/29 0800) Cardiac Rhythm: Normal sinus rhythm (01/29 0800) Resp:  [12-22] 21 (01/29 0800) BP: (111-189)/(66-114) 171/95 (01/29 0800) SpO2:  [95 %-100 %] 99 % (01/29 0800)  Hemodynamic parameters for last 24 hours: Sinus rhythm rate 82  Intake/Output from previous day: 01/28 0701 - 01/29 0700 In: 2214.8 [P.O.:480; I.V.:545.3; Blood:1139.5; IV Piggyback:50] Out: 2655 [Urine:1985; Chest Tube:670] Intake/Output this shift: Total I/O In: 180.1 [P.O.:120; I.V.:60.1] Out: 210 [Urine:150; Chest Tube:60]  Breath sounds clear Minimal serosanguineous drainage from chest tube without air leak Surgical dressings dry and intact  Lab Results: Recent Labs    05/26/21 2012 05/27/21 0505  WBC 11.9* 10.4  HGB 9.1* 9.2*  HCT 25.9* 26.7*  PLT 159 153   BMET:  Recent Labs    05/26/21 2012 05/27/21 0505  NA 132* 133*  K 3.8 3.7  CL 97* 98  CO2 26 27  GLUCOSE 124* 95  BUN 49* 42*  CREATININE 2.19* 1.67*  CALCIUM 7.4* 7.3*    PT/INR:  Recent Labs    05/25/21 1925  LABPROT 14.0  INR 1.1   ABG    Component Value Date/Time   PHART 7.444 05/22/2021 1047   HCO3 27.1  05/22/2021 1047   O2SAT 98.8 05/22/2021 1047   CBG (last 3)  No results for input(s): GLUCAP in the last 72 hours.  Assessment/Plan: S/P Procedure(s) (LRB): XI ROBOTIC ASSISTED THORASCOPY-LEFT LOWER LOBECTOMY (Left) INTERCOSTAL NERVE BLOCK (Left) LYMPH NODE DISSECTION (Left) Plan Continue to monitor chest tube output leave chest tube to suction Monitor blood pressure Transfer back to progressive care unit Resume regular diet and oral medications for pain CBC and chest x-ray in a.m.   LOS: 3 days    Dahlia Byes 05/27/2021

## 2021-05-27 NOTE — Progress Notes (Signed)
0700 - Report received from PM RN.  All questions answered.  Safety checks performed.  Lines and drip verified. Hand hygiene performed before/after each pt contact. 0800 - Assessment and Rx. 1100 - Foley D/C'd.  Dr. Darcey Nora rounded on pt.  Metoprolol added d/t HTN.  Pt assisted to bathroom, had BM, and bathed with soap and CHG.  Pt assisted to chair.  Pt's wife arrived to visit. 1215 - Pt's room approved.  Pt notified. 1225 - Report called to Rudy Jew RN.  All questions answered. 1300 - Pt transport to [place] rm 2.

## 2021-05-28 ENCOUNTER — Inpatient Hospital Stay (HOSPITAL_COMMUNITY): Payer: MEDICARE

## 2021-05-28 LAB — SURGICAL PATHOLOGY

## 2021-05-28 LAB — CBC
HCT: 28.7 % — ABNORMAL LOW (ref 36.0–46.0)
Hemoglobin: 10 g/dL — ABNORMAL LOW (ref 12.0–15.0)
MCH: 29.2 pg (ref 26.0–34.0)
MCHC: 34.8 g/dL (ref 30.0–36.0)
MCV: 83.7 fL (ref 80.0–100.0)
Platelets: 194 10*3/uL (ref 150–400)
RBC: 3.43 MIL/uL — ABNORMAL LOW (ref 3.87–5.11)
RDW: 14.5 % (ref 11.5–15.5)
WBC: 13.9 10*3/uL — ABNORMAL HIGH (ref 4.0–10.5)
nRBC: 0 % (ref 0.0–0.2)

## 2021-05-28 LAB — BASIC METABOLIC PANEL
Anion gap: 8 (ref 5–15)
BUN: 28 mg/dL — ABNORMAL HIGH (ref 8–23)
CO2: 26 mmol/L (ref 22–32)
Calcium: 7.6 mg/dL — ABNORMAL LOW (ref 8.9–10.3)
Chloride: 102 mmol/L (ref 98–111)
Creatinine, Ser: 1.12 mg/dL — ABNORMAL HIGH (ref 0.44–1.00)
GFR, Estimated: 51 mL/min — ABNORMAL LOW (ref >60)
Glucose, Bld: 111 mg/dL — ABNORMAL HIGH (ref 70–99)
Potassium: 3.3 mmol/L — ABNORMAL LOW (ref 3.5–5.1)
Sodium: 136 mmol/L (ref 135–145)

## 2021-05-28 MED ORDER — METOCLOPRAMIDE HCL 5 MG/ML IJ SOLN
10.0000 mg | Freq: Four times a day (QID) | INTRAMUSCULAR | Status: AC
  Administered 2021-05-28 – 2021-05-29 (×4): 10 mg via INTRAVENOUS
  Filled 2021-05-28 (×4): qty 2

## 2021-05-28 MED ORDER — TRIAMTERENE-HCTZ 37.5-25 MG PO TABS
1.0000 | ORAL_TABLET | Freq: Every day | ORAL | Status: DC
  Administered 2021-05-28 – 2021-05-29 (×2): 1 via ORAL
  Filled 2021-05-28 (×3): qty 1

## 2021-05-28 NOTE — Progress Notes (Addendum)
° °   °  [address]       [place] [address]             [phone]      4 Days Post-Op Procedure(s) (LRB): XI ROBOTIC ASSISTED THORASCOPY-LEFT LOWER LOBECTOMY (Left) INTERCOSTAL NERVE BLOCK (Left) LYMPH NODE DISSECTION (Left)  Subjective:  Patient is feeling better.  She continues to have some nausea at times, so she hasn't eaten much.  No BM yet  Objective: Vital signs in last 24 hours: Temp:  [97.8 F (36.6 C)-98.3 F (36.8 C)] 98.2 F (36.8 C) (01/30 0747) Pulse Rate:  [78-104] 84 (01/30 0747) Cardiac Rhythm: Normal sinus rhythm (01/30 0800) Resp:  [14-29] 16 (01/30 0747) BP: (132-187)/(80-140) 176/108 (01/30 0747) SpO2:  [90 %-98 %] 97 % (01/30 0747)  Intake/Output from previous day: 01/29 0701 - 01/30 0700 In: 914.7 [P.O.:720; I.V.:194.7] Out: 760 [Urine:450; Chest Tube:310] Intake/Output this shift: Total I/O In: 30 [I.V.:30] Out: -   General appearance: alert, cooperative, and no distress Heart: regular rate and rhythm Lungs: clear to auscultation bilaterally Abdomen: soft, non-tender; bowel sounds normal; no masses,  no organomegaly Extremities: extremities normal, atraumatic, no cyanosis or edema Wound: clean and dry, mild hematoma at one of the posterior port sites  Lab Results: Recent Labs    05/27/21 0505 05/28/21 0054  WBC 10.4 13.9*  HGB 9.2* 10.0*  HCT 26.7* 28.7*  PLT 153 194   BMET:  Recent Labs    05/27/21 0505 05/28/21 0054  NA 133* 136  K 3.7 3.3*  CL 98 102  CO2 27 26  GLUCOSE 95 111*  BUN 42* 28*  CREATININE 1.67* 1.12*  CALCIUM 7.3* 7.6*    PT/INR:  Recent Labs    05/25/21 1925  LABPROT 14.0  INR 1.1   ABG    Component Value Date/Time   PHART 7.444 05/22/2021 1047   HCO3 27.1 05/22/2021 1047   O2SAT 98.8 05/22/2021 1047   CBG (last 3)  No results for input(s): GLUCAP in the last 72 hours.  Assessment/Plan: S/P Procedure(s) (LRB): XI ROBOTIC ASSISTED THORASCOPY-LEFT LOWER LOBECTOMY  (Left) INTERCOSTAL NERVE BLOCK (Left) LYMPH NODE DISSECTION (Left)  CV-NSR, + HTN-patient denies history, currently on Lopressor, will resume home Maxide Pulm- CT 310 dark drainage, CXR with trace apical space... no air leak, place chest tube to water seal.. will likely remove chest tube once output decreases Renal- AKI resolving, creatinine down to 1.12 Post operative blood loss anemia- tx with 4 units of packed cells,  2 units of FFP, Hgb at 10.0 GI- nausea mild, but persistent... will add reglan Dispo- patient stable, start Maxide for additional BP control, AKI resolving level down to 1.12, Hgb stable at 10, CT output decreasing, leave in place until low, reglan for nausea, oral intake needs to increase   LOS: 4 days    Ellwood Handler, PA-C' 05/28/2021  Agree with above. Low chest tube output Continue pulmonary hygiene.  Zachary Nole Bary Leriche

## 2021-05-28 NOTE — Progress Notes (Signed)
Chest tube dressing changed, tolerated well. Site clean  and dry.

## 2021-05-28 NOTE — Progress Notes (Signed)
Mobility Specialist Progress Note    05/28/21 1247  Mobility  Activity Ambulated independently in hallway  Level of Assistance Modified independent, requires aide device or extra time  Assistive Device  (IV pole)  Distance Ambulated (ft) 300 ft  Activity Response Tolerated well  $Mobility charge 1 Mobility   Post-Mobility: 89 HR  Pt received in room and agreeable. No complaints on walk. Returned to bed with call bell in reach and visitor present.   [facility] Mobility Specialist  M.S. 2C and 6E: [phone] M.S. 4E: [phone]

## 2021-05-28 NOTE — Care Management Important Message (Signed)
Important Message  Patient Details  Name: Kendra Taylor MRN: [id number] Date of Birth: 1945/11/24   Medicare Important Message Given:  Yes     Veneda Kirksey 05/28/2021, 1:47 PM

## 2021-05-29 DIAGNOSIS — M0589 Other rheumatoid arthritis with rheumatoid factor of multiple sites: Secondary | ICD-10-CM | POA: Diagnosis not present

## 2021-05-29 DIAGNOSIS — M1990 Unspecified osteoarthritis, unspecified site: Secondary | ICD-10-CM | POA: Diagnosis not present

## 2021-05-29 DIAGNOSIS — K219 Gastro-esophageal reflux disease without esophagitis: Secondary | ICD-10-CM | POA: Diagnosis not present

## 2021-05-29 DIAGNOSIS — M069 Rheumatoid arthritis, unspecified: Secondary | ICD-10-CM | POA: Diagnosis not present

## 2021-05-29 LAB — CYTOLOGY - NON PAP

## 2021-05-29 NOTE — Progress Notes (Signed)
Chest tube site dressing saturated with bloody drainage. Dressing reinforced, continue to monitor.

## 2021-05-29 NOTE — Progress Notes (Signed)
°   °  [address]       [place] [address]             [phone]       No events overnight  Vitals:   05/29/21 0359 05/29/21 0725  BP: (!) 146/96 (!) 173/98  Pulse: 86   Resp: 16 13  Temp: 97.9 F (36.6 C) 97.8 F (36.6 C)  SpO2: 98% 98%   Alert NAD Sinus EWOB Thin SS CT output 200 over last 24hrs  Lab Results  Component Value Date   WBC 13.9 (H) 05/28/2021   HGB 10.0 (L) 05/28/2021   HCT 28.7 (L) 05/28/2021   MCV 83.7 05/28/2021   PLT 194 05/28/2021   POD 5 s/p L RAT, LLLectomy.   Will remove CT today Will watch for another 24hrs Continue pulm toilet  [place]

## 2021-05-29 NOTE — Progress Notes (Signed)
Mobility Specialist Progress Note:   05/29/21 1006  Mobility  Bed Position Chair  Activity Ambulated with assistance in hallway  Level of Assistance Independent after set-up  Assistive Device  (IV pole)  Distance Ambulated (ft) 300 ft  Activity Response Tolerated well  $Mobility charge 1 Mobility   Pt received ambulating with NT. No complaints of pain and asymptomatic throughout walk. Left in chair with call bell in reach and all needs met.   [facility] Public librarian Phone [phone] Secondary Phone [phone]

## 2021-05-30 ENCOUNTER — Inpatient Hospital Stay (HOSPITAL_COMMUNITY): Payer: MEDICARE

## 2021-05-30 MED ORDER — TRAMADOL HCL 50 MG PO TABS: 50.0000 mg | ORAL_TABLET | Freq: Four times a day (QID) | ORAL | 0 refills | Status: DC | PRN

## 2021-05-30 MED ORDER — METOPROLOL TARTRATE 25 MG PO TABS
25.0000 mg | ORAL_TABLET | Freq: Two times a day (BID) | ORAL | Status: DC
  Filled 2021-05-30: qty 1

## 2021-05-30 MED ORDER — METOPROLOL TARTRATE 25 MG PO TABS: 25.0000 mg | ORAL_TABLET | Freq: Two times a day (BID) | ORAL | 3 refills | Status: DC

## 2021-05-30 MED ORDER — TRIAMTERENE-HCTZ 37.5-25 MG PO TABS: 1.0000 | ORAL_TABLET | Freq: Every day | ORAL | 3 refills | Status: DC

## 2021-05-30 NOTE — Progress Notes (Signed)
° °   °  [address]       ,[place] [address]             [phone]      6 Days Post-Op Procedure(s) (LRB): XI ROBOTIC ASSISTED THORASCOPY-LEFT LOWER LOBECTOMY (Left) INTERCOSTAL NERVE BLOCK (Left) LYMPH NODE DISSECTION (Left)  Subjective:  Patient has no complaints.  She is feeling ready to go home today.  Objective: Vital signs in last 24 hours: Temp:  [98 F (36.7 C)-98.8 F (37.1 C)] 98.8 F (37.1 C) (02/01 0419) Pulse Rate:  [71-85] 85 (02/01 0419) Cardiac Rhythm: Normal sinus rhythm (02/01 0700) Resp:  [10-20] 13 (02/01 0419) BP: (161-180)/(88-100) 161/88 (02/01 0419) SpO2:  [95 %-98 %] 95 % (02/01 0419)  Intake/Output from previous day: 01/31 0701 - 02/01 0700 In: 470 [P.O.:200; I.V.:270] Out: -   General appearance: alert, cooperative, and no distress Heart: regular rate and rhythm Lungs: clear to auscultation bilaterally Abdomen: soft, non-tender; bowel sounds normal; no masses,  no organomegaly Extremities: extremities normal, atraumatic, no cyanosis or edema Wound: clean and dry  Lab Results: Recent Labs    05/28/21 0054  WBC 13.9*  HGB 10.0*  HCT 28.7*  PLT 194   BMET:  Recent Labs    05/28/21 0054  NA 136  K 3.3*  CL 102  CO2 26  GLUCOSE 111*  BUN 28*  CREATININE 1.12*  CALCIUM 7.6*    PT/INR: No results for input(s): LABPROT, INR in the last 72 hours. ABG    Component Value Date/Time   PHART 7.444 05/22/2021 1047   HCO3 27.1 05/22/2021 1047   O2SAT 98.8 05/22/2021 1047   CBG (last 3)  No results for input(s): GLUCAP in the last 72 hours.  Assessment/Plan: S/P Procedure(s) (LRB): XI ROBOTIC ASSISTED THORASCOPY-LEFT LOWER LOBECTOMY (Left) INTERCOSTAL NERVE BLOCK (Left) LYMPH NODE DISSECTION (Left)  CV- NSR, remains hypertensive- will increase Lopressor to 25 mg BID, continue Maxzide Pulm- off oxygen, CXR is w/o pneumothorax, pleural effusion Gi- nausea improved Dispo- patient stable, will d/c home today   LOS: 6 days    Ellwood Handler, PA-C 05/30/2021

## 2021-05-30 NOTE — TOC Transition Note (Signed)
Transition of Care [facility]) - CM/SW Discharge Note   Patient Details  Name: Kendra Taylor MRN: [id number] Date of Birth: [birth year]  Transition of Care [facility]) CM/SW Contact:  Angelita Ingles, RN Phone Number:[phone]  05/30/2021, 11:36 AM   Clinical Narrative:    Patient is discharging home and needs rolling walker. Patient is currently in discharge lounge. CM has called DME referral to [facility] with Jaconita. Adela Lank has been made aware that patient is currently in d/c lounge and that walker should be delivered there. CM spoke with patient via cell phone [phone] and patient has been updated and made aware that walker will be delivered to the discharge lounge.    Final next level of care: Home/Self Care Barriers to Discharge: No Barriers Identified   Patient Goals and CMS Choice Patient states their goals for this hospitalization and ongoing recovery are:: Wants to go home      Discharge Placement                       Discharge Plan and Services                DME Arranged: Walker rolling DME Agency: [employer] Date DME Agency Contacted: 05/30/21 Time DME Agency Contacted: [phone] Representative spoke with at DME Agency: Adela Lank            Social Determinants of Health ([place]) Interventions     Readmission Risk Interventions No flowsheet data found.

## 2021-06-04 DIAGNOSIS — R030 Elevated blood-pressure reading, without diagnosis of hypertension: Secondary | ICD-10-CM | POA: Diagnosis not present

## 2021-06-04 DIAGNOSIS — R710 Precipitous drop in hematocrit: Secondary | ICD-10-CM | POA: Diagnosis not present

## 2021-06-04 DIAGNOSIS — M0589 Other rheumatoid arthritis with rheumatoid factor of multiple sites: Secondary | ICD-10-CM | POA: Diagnosis not present

## 2021-06-04 DIAGNOSIS — D508 Other iron deficiency anemias: Secondary | ICD-10-CM | POA: Diagnosis not present

## 2021-06-04 DIAGNOSIS — I10 Essential (primary) hypertension: Secondary | ICD-10-CM | POA: Diagnosis not present

## 2021-06-04 DIAGNOSIS — Z8673 Personal history of transient ischemic attack (TIA), and cerebral infarction without residual deficits: Secondary | ICD-10-CM | POA: Diagnosis not present

## 2021-06-04 DIAGNOSIS — Z9889 Other specified postprocedural states: Secondary | ICD-10-CM | POA: Diagnosis not present

## 2021-06-05 DIAGNOSIS — D508 Other iron deficiency anemias: Secondary | ICD-10-CM | POA: Diagnosis not present

## 2021-06-06 ENCOUNTER — Encounter: Payer: Self-pay | Admitting: Pulmonary Disease

## 2021-06-06 ENCOUNTER — Other Ambulatory Visit: Payer: Self-pay

## 2021-06-06 ENCOUNTER — Ambulatory Visit (INDEPENDENT_AMBULATORY_CARE_PROVIDER_SITE_OTHER): Payer: MEDICARE | Admitting: Pulmonary Disease

## 2021-06-06 VITALS — BP 124/66 | HR 73 | Temp 98.4°F | Ht 61.0 in | Wt 109.4 lb

## 2021-06-06 DIAGNOSIS — Z902 Acquired absence of lung [part of]: Secondary | ICD-10-CM

## 2021-06-06 DIAGNOSIS — Z9889 Other specified postprocedural states: Secondary | ICD-10-CM | POA: Diagnosis not present

## 2021-06-06 DIAGNOSIS — C3492 Malignant neoplasm of unspecified part of left bronchus or lung: Secondary | ICD-10-CM | POA: Diagnosis not present

## 2021-06-06 NOTE — Progress Notes (Signed)
Synopsis: Referred in November 2022 for lung mass by Deland Pretty, MD  Subjective:   PATIENT ID: Kendra Taylor GENDER: female DOB: 1946/04/02, MRN: [id number]  Chief Complaint  Patient presents with   Follow-up    Follow up on PFT     This is a 76 year old female with a past medical history of reflux, basal cell carcinoma, anxiety, rheumatoid arthritis on Remicade infusion.  Here for evaluation of a left lower lobe pulmonary nodule.  In 2018 she was taken for bronchoscopy for a left lower lobe pulmonary nodule.  There was no evidence of malignancy on tissue sampling.February 26, 2017 patient was taken for navigational bronchoscopy by Dr. Lamonte Sakai.  Biopsy of the lesion was negative.  She did not have any additional follow-up and failed to follow-up after the bronchoscopy.  She had a repeat coronary CT scan in May 2022.  This coronary CT scan revealed a 3.6 x 2.4 cm mass within the left lung.  Slightly enlarged from her October 2017 2018 CT.  OV 04/04/2021: Here today for follow-up after recent nuclear medicine pet imaging. Patient had nuclear medicine pet image completed on 03/28/2021.  This revealed low-level metabolic uptake within the near 3 cm part solid left lower lobe lesion.  No associated adenopathy.  OV 06/06/2021: Here today for follow-up after recent combined robotic assisted navigational bronchoscopy followed by robotic lobectomy with myself and Dr. Kipp Brood. Patient was diagnosed with adenocarcinoma of the lung.  Patient's surgical cytology and pathology specimens were positive for adenocarcinoma, acinar predominant, 2.7 cm margins negative, no visceral pleura involvement and 6 lymph nodes negative for metastatic carcinoma.  Patient was discharged from the hospital on 05/30/2021.  Has not seen Dr. Kipp Brood yet in the office.  Postop follow-up appointment scheduled for 06/08/2021.  Patient feels like she is breathing better.  Since surgery.  Still sore on the chest.   Past Medical  History:  Diagnosis Date   Anemia    Anxiety    Arthritis    Atypical mole 01/08/2013   right inner forearm   Basal cell carcinoma 03/12/2017   right concha exc mohs   Depression    Dyspnea    GERD (gastroesophageal reflux disease)    HA (headache)    Migraine    Sleep apnea    cpap     Family History  Problem Relation Age of Onset   Dementia Mother    Sleep apnea Mother    Hyperthyroidism Mother    Dementia Father    Sleep apnea Father      Past Surgical History:  Procedure Laterality Date   ABDOMINAL HYSTERECTOMY     BRONCHIAL BIOPSY  05/24/2021   Procedure: BRONCHIAL BIOPSIES;  Surgeon: [name], DO;  Location: [facility];  Service: Pulmonary;;   BRONCHIAL BRUSHINGS  05/24/2021   Procedure: BRONCHIAL BRUSHINGS;  Surgeon: [name], DO;  Location: [facility];  Service: Pulmonary;;   BRONCHIAL NEEDLE ASPIRATION BIOPSY  05/24/2021   Procedure: BRONCHIAL NEEDLE ASPIRATION BIOPSIES;  Surgeon: [name], DO;  Location: [facility];  Service: Pulmonary;;   BUNIONECTOMY     both   FIDUCIAL MARKER PLACEMENT  05/24/2021   Procedure: FIDUCIAL DYE MARKING;  Surgeon: [name], DO;  Location: [facility];  Service: Pulmonary;;   FINGER ARTHROPLASTY Right 07/30/2012   Procedure: RIGHT INDEX FINGER, MIDDLE FINGER, RING FINGER, SMALL FINGER MCP ARTHROPLASTY WITH REALIGNMENT/REPAIR OF EXTENSOR TENDON;  Surgeon: Roseanne Kaufman, MD;  Location: [facility];  Service: Orthopedics;  Laterality: Right;   FINGER SURGERY     HAMMER TOE SURGERY Left 03/03/2014   Procedure: SECOND THROUGH FIFTH HAMMERTOE CORRECTION;  Surgeon: Wylene Simmer, MD;  Location: [facility];  Service: Orthopedics;  Laterality: Left;   INTERCOSTAL NERVE BLOCK Left 05/24/2021   Procedure: INTERCOSTAL NERVE BLOCK;  Surgeon: Lajuana Matte, MD;  Location: [facility];  Service: Thoracic;  Laterality: Left;   JOINT REPLACEMENT     LYMPH NODE DISSECTION Left 05/24/2021   Procedure: LYMPH  NODE DISSECTION;  Surgeon: Lajuana Matte, MD;  Location: [facility];  Service: Thoracic;  Laterality: Left;   METATARSAL HEAD EXCISION Left 03/03/2014   Procedure: LEFT SECOND THROUGH FIFTH METATARSAL HEAD EXCISION;  Surgeon: Wylene Simmer, MD;  Location: [facility];  Service: Orthopedics;  Laterality: Left;   OOPHORECTOMY     VIDEO BRONCHOSCOPY Bilateral 12/18/2016   Procedure: VIDEO BRONCHOSCOPY WITH FLUORO;  Surgeon: Tanda Rockers, MD;  Location: [facility];  Service: Cardiopulmonary;  Laterality: Bilateral;   VIDEO BRONCHOSCOPY WITH ENDOBRONCHIAL NAVIGATION Left 02/26/2017   Procedure: VIDEO BRONCHOSCOPY WITH ENDOBRONCHIAL NAVIGATION;  Surgeon: Collene Gobble, MD;  Location: [facility];  Service: Thoracic;  Laterality: Left;   VIDEO BRONCHOSCOPY WITH RADIAL ENDOBRONCHIAL ULTRASOUND  05/24/2021   Procedure: VIDEO BRONCHOSCOPY WITH RADIAL ENDOBRONCHIAL ULTRASOUND;  Surgeon: [name], DO;  Location: [facility];  Service: Pulmonary;;    Social History   Socioeconomic History   Marital status: Married    Spouse name: Remo Lipps   Number of children: 0   Years of education: masters   Highest education level: Not on file  Occupational History    Comment: Retired  Tobacco Use   Smoking status: Never   Smokeless tobacco: Never  Vaping Use   Vaping Use: Not on file  Substance and Sexual Activity   Alcohol use: No    Alcohol/week: 0.0 standard drinks   Drug use: No   Sexual activity: Not on file  Other Topics Concern   Not on file  Social History Narrative   Patient lives at home with her spouse (Paltrineri Remo Lipps.) Patient has two cats.   Retired.   Arboriculturist.   Right handed.   Caffeine One cup of coffee daily.   Social Determinants of Health   Financial Resource Strain: Not on file  Food Insecurity: Not on file  Transportation Needs: Not on file  Physical Activity: Not on file  Stress: Not on file  Social Connections: Not on file  Intimate Partner  Violence: Not on file     Allergies  Allergen Reactions   Aspirin Other (See Comments)    Ringing in ears with large doses, large doses causes her to lose hearing   Ubrogepant Nausea And Vomiting   Venlafaxine     Headaches     Outpatient Medications Prior to Visit  Medication Sig Dispense Refill   acetaminophen (TYLENOL) 500 MG tablet Take 500 mg by mouth every 6 (six) hours as needed.     alendronate (FOSAMAX) 70 MG tablet Take 70 mg by mouth once a week.  3   atorvastatin (LIPITOR) 80 MG tablet Take 1 tablet (80 mg total) by mouth daily. 30 tablet 1   cholecalciferol (VITAMIN D3) 25 MCG (1000 UNIT) tablet Take 1,000 Units by mouth daily.     Cyanocobalamin (B-12 PO) Take 1 tablet by mouth daily.     fluticasone (FLONASE) 50 MCG/ACT nasal spray Place 1 spray into both nostrils daily as needed for allergies.  folic acid (FOLVITE) 1 MG tablet Take 1 mg by mouth daily.     inFLIXimab (REMICADE) 100 MG injection Inject into the vein every 8 (eight) weeks.      levocetirizine (XYZAL) 5 MG tablet Take 5 mg by mouth at bedtime as needed for allergies.     Magnesium 400 MG CAPS Take 400 mg by mouth every other day.     methotrexate (RHEUMATREX) 2.5 MG tablet Take 15 mg by mouth every Thursday.     metoprolol tartrate (LOPRESSOR) 25 MG tablet Take 1 tablet (25 mg total) by mouth 2 (two) times daily. 60 tablet 3   olopatadine (PATANOL) 0.1 % ophthalmic solution Place 1 drop into both eyes daily as needed for allergies.     ondansetron (ZOFRAN ODT) 4 MG disintegrating tablet Take 1 tablet (4 mg total) by mouth every 8 (eight) hours as needed for nausea or vomiting. 20 tablet 6   pantoprazole (PROTONIX) 40 MG tablet Take 40 mg by mouth every morning.     Polyethyl Glycol-Propyl Glycol (SYSTANE OP) Place 1 drop into both eyes as needed (for dry eyes).     Rimegepant Sulfate (NURTEC) 75 MG TBDP Take 1 tab at onset of migraine.  May repeat in 2 hrs, if needed.  Max dose: 2 tabs/day. This is a 30  day prescription. 12 tablet 11   traMADol (ULTRAM) 50 MG tablet Take 1 tablet (50 mg total) by mouth every 6 (six) hours as needed for moderate pain. 30 tablet 0   triamcinolone cream (KENALOG) 0.1 % Apply 1 application topically as needed (for skin irritation).     triamterene-hydrochlorothiazide (MAXZIDE-25) 37.5-25 MG tablet Take 1 tablet by mouth daily. 30 tablet 3   aspirin EC 81 MG EC tablet Take 1 tablet (81 mg total) by mouth daily. Swallow whole. (Patient not taking: Reported on 05/15/2021) 30 tablet 11   No facility-administered medications prior to visit.    Review of Systems  Constitutional:  Negative for chills, fever, malaise/fatigue and weight loss.  HENT:  Negative for hearing loss, sore throat and tinnitus.   Eyes:  Negative for blurred vision and double vision.  Respiratory:  Positive for shortness of breath. Negative for cough, hemoptysis, sputum production, wheezing and stridor.   Cardiovascular:  Positive for chest pain. Negative for palpitations, orthopnea, leg swelling and PND.  Gastrointestinal:  Negative for abdominal pain, constipation, diarrhea, heartburn, nausea and vomiting.  Genitourinary:  Negative for dysuria, hematuria and urgency.  Musculoskeletal:  Negative for joint pain and myalgias.  Skin:  Negative for itching and rash.  Neurological:  Negative for dizziness, tingling, weakness and headaches.  Endo/Heme/Allergies:  Negative for environmental allergies. Does not bruise/bleed easily.  Psychiatric/Behavioral:  Negative for depression. The patient is not nervous/anxious and does not have insomnia.   All other systems reviewed and are negative.   Objective:  Physical Exam Vitals reviewed.  Constitutional:      General: She is not in acute distress.    Appearance: She is well-developed.     Comments: Thin elderly female.  HENT:     Head: Normocephalic and atraumatic.  Eyes:     General: No scleral icterus.    Conjunctiva/sclera: Conjunctivae normal.      Pupils: Pupils are equal, round, and reactive to light.  Neck:     Vascular: No JVD.     Trachea: No tracheal deviation.  Cardiovascular:     Rate and Rhythm: Normal rate and regular rhythm.     Heart sounds:  Normal heart sounds. No murmur heard.    Comments: Some bruising at incision sites.  On the left chest.  Otherwise clean dry and intact Pulmonary:     Effort: Pulmonary effort is normal. No tachypnea, accessory muscle usage or respiratory distress.     Breath sounds: No stridor. No wheezing, rhonchi or rales.  Abdominal:     General: There is no distension.     Palpations: Abdomen is soft.     Tenderness: There is no abdominal tenderness.  Musculoskeletal:        General: No tenderness.     Cervical back: Neck supple.  Lymphadenopathy:     Cervical: No cervical adenopathy.  Skin:    General: Skin is warm and dry.     Capillary Refill: Capillary refill takes less than 2 seconds.     Findings: No rash.  Neurological:     Mental Status: She is alert and oriented to person, place, and time.  Psychiatric:        Behavior: Behavior normal.     Vitals:   06/06/21 0918  BP: 124/66  Pulse: 73  Temp: 98.4 F (36.9 C)  TempSrc: Oral  SpO2: 94%  Weight: 109 lb 6.4 oz (49.6 kg)  Height: 5\' 1"  (1.549 m)     94% RA BMI Readings from Last 3 Encounters:  06/06/21 20.67 kg/m  05/29/21 19.60 kg/m  05/22/21 20.58 kg/m   Wt Readings from Last 3 Encounters:  06/06/21 109 lb 6.4 oz (49.6 kg)  05/29/21 107 lb 2.3 oz (48.6 kg)  05/22/21 112 lb 8 oz (51 kg)     CBC    Component Value Date/Time   WBC 13.9 (H) 05/28/2021 0054   RBC 3.43 (L) 05/28/2021 0054   HGB 10.0 (L) 05/28/2021 0054   HCT 28.7 (L) 05/28/2021 0054   PLT 194 05/28/2021 0054   MCV 83.7 05/28/2021 0054   MCH 29.2 05/28/2021 0054   MCHC 34.8 05/28/2021 0054   RDW 14.5 05/28/2021 0054   LYMPHSABS 1.3 12/26/2020 1242   MONOABS 1.1 (H) 12/26/2020 1242   EOSABS 0.1 12/26/2020 1242   BASOSABS 0.0  12/26/2020 1242    Chest Imaging: May 22nd 2022: Coronary CT imaging: A portion of the left lower lobe observed on CT imaging reveals a 3.6 cm left lower lobe mass. The patient's images have been independently reviewed by me.    03/28/2021 nuclear medicine pet imaging: Low-level hypermetabolic uptake within the left lower lobe lesion. The patient's images have been independently reviewed by me.    Pulmonary Functions Testing Results: PFT Results Latest Ref Rng & Units 04/09/2021 11/23/2015  FVC-Pre L 3.31 3.17  FVC-Predicted Pre % 128 114  FVC-Post L 3.15 3.05  FVC-Predicted Post % 122 110  Pre FEV1/FVC % % 76 83  Post FEV1/FCV % % 79 83  FEV1-Pre L 2.52 2.62  FEV1-Predicted Pre % 130 126  FEV1-Post L 2.50 2.54  DLCO uncorrected ml/min/mmHg 13.06 18.57  DLCO UNC% % 71 82  DLCO corrected ml/min/mmHg 13.06 18.04  DLCO COR %Predicted % 71 79  DLVA Predicted % 65 83  TLC L 5.23 -  TLC % Predicted % 107 -  RV % Predicted % 98 -    FeNO:   Pathology:   Echocardiogram:   Heart Catheterization:     Assessment & Plan:     ICD-10-CM   1. Adenocarcinoma of left lung, stage 1 (HCC)  C34.92 Ambulatory referral to Hematology / Oncology    2.  History of robotic lobectomy of lung  Z90.2    05/24/2021 Dr. Kipp Brood     3. History of robotic bronchoscopy  Z98.890    05/24/2021 Dr. Valeta Harms        Discussion:  This is a 76 year old female, history of rheumatoid arthritis on Remicade, non-smoker, left lower lobe pulmonary nodule dating back since 2017 enlargement after follow-up CT imaging now at 3.6 cm largest cross-section however on pathologic specimen was documented at 2.7 cm consistent with an adenocarcinoma, 6 nodes negative for malignancy.  Plan: Postop follow-up with thoracic surgery planned. She will need a CT chest in 6 months. I am happy to do this or thoracic surgery can. She would like to speak with medical oncology. Referral placed to Dr. Earlie Server. Follow-up with  [place] in 6 months or as needed.    Current Outpatient Medications:    acetaminophen (TYLENOL) 500 MG tablet, Take 500 mg by mouth every 6 (six) hours as needed., Disp: , Rfl:    alendronate (FOSAMAX) 70 MG tablet, Take 70 mg by mouth once a week., Disp: , Rfl: 3   atorvastatin (LIPITOR) 80 MG tablet, Take 1 tablet (80 mg total) by mouth daily., Disp: 30 tablet, Rfl: 1   cholecalciferol (VITAMIN D3) 25 MCG (1000 UNIT) tablet, Take 1,000 Units by mouth daily., Disp: , Rfl:    Cyanocobalamin (B-12 PO), Take 1 tablet by mouth daily., Disp: , Rfl:    fluticasone (FLONASE) 50 MCG/ACT nasal spray, Place 1 spray into both nostrils daily as needed for allergies., Disp: , Rfl:    folic acid (FOLVITE) 1 MG tablet, Take 1 mg by mouth daily., Disp: , Rfl:    inFLIXimab (REMICADE) 100 MG injection, Inject into the vein every 8 (eight) weeks. , Disp: , Rfl:    levocetirizine (XYZAL) 5 MG tablet, Take 5 mg by mouth at bedtime as needed for allergies., Disp: , Rfl:    Magnesium 400 MG CAPS, Take 400 mg by mouth every other day., Disp: , Rfl:    methotrexate (RHEUMATREX) 2.5 MG tablet, Take 15 mg by mouth every Thursday., Disp: , Rfl:    metoprolol tartrate (LOPRESSOR) 25 MG tablet, Take 1 tablet (25 mg total) by mouth 2 (two) times daily., Disp: 60 tablet, Rfl: 3   olopatadine (PATANOL) 0.1 % ophthalmic solution, Place 1 drop into both eyes daily as needed for allergies., Disp: , Rfl:    ondansetron (ZOFRAN ODT) 4 MG disintegrating tablet, Take 1 tablet (4 mg total) by mouth every 8 (eight) hours as needed for nausea or vomiting., Disp: 20 tablet, Rfl: 6   pantoprazole (PROTONIX) 40 MG tablet, Take 40 mg by mouth every morning., Disp: , Rfl:    Polyethyl Glycol-Propyl Glycol (SYSTANE OP), Place 1 drop into both eyes as needed (for dry eyes)., Disp: , Rfl:    Rimegepant Sulfate (NURTEC) 75 MG TBDP, Take 1 tab at onset of migraine.  May repeat in 2 hrs, if needed.  Max dose: 2 tabs/day. This is a 30 day  prescription., Disp: 12 tablet, Rfl: 11   traMADol (ULTRAM) 50 MG tablet, Take 1 tablet (50 mg total) by mouth every 6 (six) hours as needed for moderate pain., Disp: 30 tablet, Rfl: 0   triamcinolone cream (KENALOG) 0.1 %, Apply 1 application topically as needed (for skin irritation)., Disp: , Rfl:    triamterene-hydrochlorothiazide (MAXZIDE-25) 37.5-25 MG tablet, Take 1 tablet by mouth daily., Disp: 30 tablet, Rfl: 3   aspirin EC 81 MG EC tablet, Take 1  tablet (81 mg total) by mouth daily. Swallow whole. (Patient not taking: Reported on 05/15/2021), Disp: 30 tablet, Rfl: [place], DO [facility] 06/06/2021 9:51 AM

## 2021-06-06 NOTE — Patient Instructions (Addendum)
Thank you for visiting Dr. Valeta Harms at [facility]. Today we recommend the following:  Orders Placed This Encounter  Procedures   Ambulatory referral to Hematology / Oncology   Return in about 6 months (around 12/04/2021), or if symptoms worsen or fail to improve, for with APP or Dr. Valeta Harms.    Please do your part to reduce the spread of COVID-19.

## 2021-06-07 ENCOUNTER — Telehealth: Payer: Self-pay | Admitting: Internal Medicine

## 2021-06-07 ENCOUNTER — Other Ambulatory Visit: Payer: Self-pay | Admitting: *Deleted

## 2021-06-07 NOTE — Telephone Encounter (Signed)
Scheduled appt 2/8 referral. Pt is aware of appt date and time. Pt is aware to arrive 15 mins prior to appt.

## 2021-06-07 NOTE — Progress Notes (Signed)
The proposed treatment discussed in conference is for discussion purpose only and is not a binding recommendation. The patient was not been physically examined, or presented with their treatment options. Therefore, final treatment plans cannot be decided.  

## 2021-06-08 ENCOUNTER — Ambulatory Visit (INDEPENDENT_AMBULATORY_CARE_PROVIDER_SITE_OTHER): Payer: Self-pay | Admitting: Thoracic Surgery (Cardiothoracic Vascular Surgery)

## 2021-06-08 ENCOUNTER — Other Ambulatory Visit: Payer: Self-pay

## 2021-06-08 ENCOUNTER — Ambulatory Visit: Payer: MEDICARE | Admitting: Thoracic Surgery (Cardiothoracic Vascular Surgery)

## 2021-06-08 VITALS — BP 106/51 | HR 77 | Resp 20 | Ht 61.0 in | Wt 104.0 lb

## 2021-06-08 DIAGNOSIS — Z09 Encounter for follow-up examination after completed treatment for conditions other than malignant neoplasm: Secondary | ICD-10-CM

## 2021-06-08 DIAGNOSIS — R911 Solitary pulmonary nodule: Secondary | ICD-10-CM

## 2021-06-11 NOTE — Progress Notes (Signed)
° °   °  [name]       [place] [address]             [phone]        [name] Medical Record #[id number] Date of Birth: 1945/10/16  Referring: [name], DO Primary Care: Deland Pretty, MD Primary Cardiologist:None  Reason for visit:   follow-up  History of Present Illness:     76 year old female comes in for 1 week follow-up appointment.  Overall she is doing well.  She denies any incisional pain.  She does admit to being more tired.  Physical Exam: BP (!) 106/51    Pulse 77    Resp 20    Ht 5\' 1"  (1.549 m)    Wt 104 lb (47.2 kg)    SpO2 92% Comment: RA   BMI 19.65 kg/m   Alert NAD Incision well-healed, stitch removed.   Abdomen, ND No peripheral edema   Diagnostic Studies & Laboratory data:  Path:  FINAL MICROSCOPIC DIAGNOSIS:   A. LUNG, LEFT LOWER LOBE, LOBECTOMY:  - Adenocarcinoma, acinar predominant, 2.7 cm.  - Margins negative for carcinoma.  - No visceral pleural involvement identified.  - Six lymph nodes negative for metastatic carcinoma (0/6).   B. LYMPH NODE, LEVEL 7, EXCISION:  - One lymph node negative for metastatic carcinoma (0/1).   C. LYMPH NODE, HILAR, EXCISION:  - One lymph node negative for metastatic carcinoma (0/1).   D. LYMPH NODE, LEVEL 9, EXCISION:  - One lymph node negative for metastatic carcinoma (0/1).   E. LYMPH NODE, HILAR #2, EXCISION:  - One lymph node negative for metastatic carcinoma (0/1).   F. LYMPH NODE, LEVEL 6, EXCISION:  - One lymph node negative for metastatic carcinoma (0/1).   ONCOLOGY TABLE:  LUNG: Resection  Synchronous Tumors: Not applicable  Total Number of Primary Tumors: One  Procedure: Left upper lobectomy and lymph node biopsies  Specimen Laterality: Left upper lobe  Tumor Focality: Unifocal  Tumor Site: Left upper lobe  Tumor Size: 2.7 x 2 x 1.8 cm  Histologic Type: Adenocarcinoma, acinar predominant.  Visceral Pleura Invasion: Not identified.   Direct Invasion of Adjacent Structures: No adjacent structures present.  Lymphovascular Invasion: Not identified  Margins: All surgical margins negative for invasive carcinoma  Treatment Effect: No known presurgical therapy  Regional Lymph Nodes:       Number of Lymph Nodes Involved: 0                            Nodal Sites with Tumor: 0       Number of Lymph Nodes Examined: 11                       Nodal Sites Examined: Hilar, 2, 6, 7, 9  Pathologic Stage Classification (pTNM, AJCC 8th Edition): pT1c, pN0     Assessment / Plan:   76 year old female status post robotic assisted left lower lobectomy for T1 cN0 M0 stage I adenocarcinoma of the lung.  Overall she is doing well.  She will follow-up in 1 month with a chest x-ray.   Kendra Taylor 06/11/2021 9:40 AM

## 2021-06-12 DIAGNOSIS — N200 Calculus of kidney: Secondary | ICD-10-CM | POA: Diagnosis not present

## 2021-06-12 DIAGNOSIS — R310 Gross hematuria: Secondary | ICD-10-CM | POA: Diagnosis not present

## 2021-06-12 DIAGNOSIS — N281 Cyst of kidney, acquired: Secondary | ICD-10-CM | POA: Diagnosis not present

## 2021-06-13 DIAGNOSIS — D72829 Elevated white blood cell count, unspecified: Secondary | ICD-10-CM | POA: Diagnosis not present

## 2021-06-14 DIAGNOSIS — D508 Other iron deficiency anemias: Secondary | ICD-10-CM | POA: Diagnosis not present

## 2021-06-14 DIAGNOSIS — R052 Subacute cough: Secondary | ICD-10-CM | POA: Diagnosis not present

## 2021-06-14 DIAGNOSIS — Z8673 Personal history of transient ischemic attack (TIA), and cerebral infarction without residual deficits: Secondary | ICD-10-CM | POA: Diagnosis not present

## 2021-06-14 DIAGNOSIS — D72829 Elevated white blood cell count, unspecified: Secondary | ICD-10-CM | POA: Diagnosis not present

## 2021-06-14 DIAGNOSIS — C3492 Malignant neoplasm of unspecified part of left bronchus or lung: Secondary | ICD-10-CM | POA: Diagnosis not present

## 2021-06-14 DIAGNOSIS — G43909 Migraine, unspecified, not intractable, without status migrainosus: Secondary | ICD-10-CM | POA: Diagnosis not present

## 2021-06-14 DIAGNOSIS — R030 Elevated blood-pressure reading, without diagnosis of hypertension: Secondary | ICD-10-CM | POA: Diagnosis not present

## 2021-06-20 ENCOUNTER — Ambulatory Visit: Payer: MEDICARE | Admitting: Physician Assistant

## 2021-06-20 ENCOUNTER — Ambulatory Visit (INDEPENDENT_AMBULATORY_CARE_PROVIDER_SITE_OTHER): Payer: MEDICARE | Admitting: Dermatology

## 2021-06-20 ENCOUNTER — Other Ambulatory Visit: Payer: Self-pay

## 2021-06-20 DIAGNOSIS — L905 Scar conditions and fibrosis of skin: Secondary | ICD-10-CM

## 2021-06-20 DIAGNOSIS — M1990 Unspecified osteoarthritis, unspecified site: Secondary | ICD-10-CM | POA: Diagnosis not present

## 2021-06-20 DIAGNOSIS — Z79899 Other long term (current) drug therapy: Secondary | ICD-10-CM | POA: Diagnosis not present

## 2021-06-20 DIAGNOSIS — M7070 Other bursitis of hip, unspecified hip: Secondary | ICD-10-CM | POA: Diagnosis not present

## 2021-06-20 DIAGNOSIS — K219 Gastro-esophageal reflux disease without esophagitis: Secondary | ICD-10-CM | POA: Diagnosis not present

## 2021-06-20 DIAGNOSIS — M069 Rheumatoid arthritis, unspecified: Secondary | ICD-10-CM | POA: Diagnosis not present

## 2021-06-20 DIAGNOSIS — L821 Other seborrheic keratosis: Secondary | ICD-10-CM | POA: Diagnosis not present

## 2021-06-20 DIAGNOSIS — M8580 Other specified disorders of bone density and structure, unspecified site: Secondary | ICD-10-CM | POA: Diagnosis not present

## 2021-06-25 ENCOUNTER — Other Ambulatory Visit: Payer: Self-pay | Admitting: Internal Medicine

## 2021-06-25 DIAGNOSIS — R911 Solitary pulmonary nodule: Secondary | ICD-10-CM

## 2021-06-26 ENCOUNTER — Other Ambulatory Visit: Payer: Self-pay

## 2021-06-26 ENCOUNTER — Inpatient Hospital Stay (HOSPITAL_BASED_OUTPATIENT_CLINIC_OR_DEPARTMENT_OTHER): Payer: MEDICARE | Admitting: Internal Medicine

## 2021-06-26 ENCOUNTER — Encounter: Payer: Self-pay | Admitting: *Deleted

## 2021-06-26 ENCOUNTER — Encounter: Payer: Self-pay | Admitting: Internal Medicine

## 2021-06-26 ENCOUNTER — Inpatient Hospital Stay: Payer: MEDICARE | Attending: Internal Medicine

## 2021-06-26 VITALS — BP 152/77 | HR 109 | Temp 98.1°F | Resp 18 | Ht 61.0 in | Wt 103.3 lb

## 2021-06-26 DIAGNOSIS — F329 Major depressive disorder, single episode, unspecified: Secondary | ICD-10-CM | POA: Diagnosis not present

## 2021-06-26 DIAGNOSIS — C3432 Malignant neoplasm of lower lobe, left bronchus or lung: Secondary | ICD-10-CM | POA: Diagnosis not present

## 2021-06-26 DIAGNOSIS — R911 Solitary pulmonary nodule: Secondary | ICD-10-CM

## 2021-06-26 DIAGNOSIS — G4730 Sleep apnea, unspecified: Secondary | ICD-10-CM

## 2021-06-26 DIAGNOSIS — Z85828 Personal history of other malignant neoplasm of skin: Secondary | ICD-10-CM | POA: Diagnosis not present

## 2021-06-26 DIAGNOSIS — Z79899 Other long term (current) drug therapy: Secondary | ICD-10-CM

## 2021-06-26 DIAGNOSIS — F419 Anxiety disorder, unspecified: Secondary | ICD-10-CM | POA: Diagnosis not present

## 2021-06-26 DIAGNOSIS — C3490 Malignant neoplasm of unspecified part of unspecified bronchus or lung: Secondary | ICD-10-CM

## 2021-06-26 DIAGNOSIS — C3492 Malignant neoplasm of unspecified part of left bronchus or lung: Secondary | ICD-10-CM

## 2021-06-26 LAB — CBC WITH DIFFERENTIAL (CANCER CENTER ONLY)
Abs Immature Granulocytes: 0.07 10*3/uL (ref 0.00–0.07)
Basophils Absolute: 0 10*3/uL (ref 0.0–0.1)
Basophils Relative: 0 %
Eosinophils Absolute: 0.9 10*3/uL — ABNORMAL HIGH (ref 0.0–0.5)
Eosinophils Relative: 7 %
HCT: 27.6 % — ABNORMAL LOW (ref 36.0–46.0)
Hemoglobin: 9.1 g/dL — ABNORMAL LOW (ref 12.0–15.0)
Immature Granulocytes: 1 %
Lymphocytes Relative: 8 %
Lymphs Abs: 1.1 10*3/uL (ref 0.7–4.0)
MCH: 28.4 pg (ref 26.0–34.0)
MCHC: 33 g/dL (ref 30.0–36.0)
MCV: 86.3 fL (ref 80.0–100.0)
Monocytes Absolute: 1.2 10*3/uL — ABNORMAL HIGH (ref 0.1–1.0)
Monocytes Relative: 9 %
Neutro Abs: 10 10*3/uL — ABNORMAL HIGH (ref 1.7–7.7)
Neutrophils Relative %: 75 %
Platelet Count: 398 10*3/uL (ref 150–400)
RBC: 3.2 MIL/uL — ABNORMAL LOW (ref 3.87–5.11)
RDW: 13.5 % (ref 11.5–15.5)
WBC Count: 13.4 10*3/uL — ABNORMAL HIGH (ref 4.0–10.5)
nRBC: 0 % (ref 0.0–0.2)

## 2021-06-26 LAB — CMP (CANCER CENTER ONLY)
ALT: 18 U/L (ref 0–44)
AST: 22 U/L (ref 15–41)
Albumin: 3.1 g/dL — ABNORMAL LOW (ref 3.5–5.0)
Alkaline Phosphatase: 70 U/L (ref 38–126)
Anion gap: 5 (ref 5–15)
BUN: 26 mg/dL — ABNORMAL HIGH (ref 8–23)
CO2: 33 mmol/L — ABNORMAL HIGH (ref 22–32)
Calcium: 9.2 mg/dL (ref 8.9–10.3)
Chloride: 97 mmol/L — ABNORMAL LOW (ref 98–111)
Creatinine: 0.71 mg/dL (ref 0.44–1.00)
GFR, Estimated: >60 mL/min (ref >60)
Glucose, Bld: 128 mg/dL — ABNORMAL HIGH (ref 70–99)
Potassium: 4.6 mmol/L (ref 3.5–5.1)
Sodium: 135 mmol/L (ref 135–145)
Total Bilirubin: 0.8 mg/dL (ref 0.3–1.2)
Total Protein: 6.6 g/dL (ref 6.5–8.1)

## 2021-06-26 NOTE — Progress Notes (Signed)
[place] [phone]   [phone]  CONSULT NOTE  REFERRING PHYSICIAN: Dr. Melodie Bouillon  REASON FOR CONSULTATION:  76 years old white female recently diagnosed with lung cancer  HPI Kendra Taylor is a 76 y.o. female with past medical history significant for anxiety, osteoarthritis, basal cell carcinoma, depression, GERD, migraine headache as well as sleep apnea.  The patient is a never smoker.  The patient has a history of pulmonary nodule since 2017.  She had CT scan of the chest on August 07, 2015 that showed an irregular shaped masslike area of architectural distortion in the left lower lobe measuring 3.4 x 2.5 x 3.3 cm at that time.  She is followed by routine imaging studies and repeat CT scan of the chest on December 19, 2015 showed no interval change in the irregular mass like area in the left lower lobe.  On December 18, 2016 the patient underwent bronchoscopy under the care of Dr. Arrie Eastern and it was not diagnostic.  CT super D of the chest was performed on February 12, 2017 and it showed stable appearance of the irregular 3.3 cm left lower lobe pulmonary lesion.  She had repeat bronchoscopy with electromagnetic navigation bronchoscopy under the care of Dr. Lamonte Sakai on February 26, 2017 and again it was not conclusive for malignancy.  She had MRI of the brain on December 26, 2020 that showed no acute intracranial abnormalities.  The patient was followed by observation and a PET scan on March 28, 2021 showed stable to very slightly larger left lower lobe lung lesion demonstrating mild hypermetabolism.  There was no other significant findings.  Repeat CT super D of the chest on May 11, 2021 showed the irregular solid 4.3 cm left lower lobe lung mass with mildly spiculated margins and associated bronchiectasis and distortion increased from 3.3 cm on the scan from February 12, 2017.  Low-grade primary bronchogenic malignancy was suspected giving the low  level of hypermetabolism and slow growth.  There was no thoracic adenopathy or other findings suspicious for metastatic disease in the chest. On May 24, 2021 the patient underwent bronchoscopy under the care of Dr. Lamonte Sakai followed by robotic assisted left video thoracoscopy, lysis of adhesions as well as left lower lobectomy with mediastinal lymph node sampling. The final pathology ([id number]) was consistent with adenocarcinoma, acinar predominant measuring 2.7 cm with negative resection margin and negative visceral pleural involvement and no evidence for lymphovascular invasion.  The dissected lymph nodes were negative for malignancy. The patient was referred to me today for evaluation and close monitoring of her condition after the surgical resection. When seen today the patient is feeling fine with no concerning complaints except for the soreness in the left side of the chest as well as shortness of breath with exertion and dry cough and currently on Tessalon.  She has no hemoptysis.  She denied having any current nausea, vomiting, diarrhea but has intermittent constipation.  She has no headache or visual changes.  She continues to slowly recovering from her recent surgery.  She walks few times a week. Family history significant for mother and father with dementia and sleep apnea. The patient is married and has no children.  She was accompanied today by her spouse Kendra Taylor.  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  She is a never smoker and drinks alcohol socially and no history of drug abuse. HPI  Past Medical History:  Diagnosis  Date   Anemia    Anxiety    Arthritis    Atypical mole 01/08/2013   right inner forearm   Basal cell carcinoma 03/12/2017   right concha exc mohs   Depression    Dyspnea    GERD (gastroesophageal reflux disease)    HA (headache)    Migraine    Sleep apnea    cpap    Past Surgical History:  Procedure  Laterality Date   ABDOMINAL HYSTERECTOMY     BRONCHIAL BIOPSY  05/24/2021   Procedure: BRONCHIAL BIOPSIES;  Surgeon: [name], DO;  Location: [facility];  Service: Pulmonary;;   BRONCHIAL BRUSHINGS  05/24/2021   Procedure: BRONCHIAL BRUSHINGS;  Surgeon: [name], DO;  Location: [facility];  Service: Pulmonary;;   BRONCHIAL NEEDLE ASPIRATION BIOPSY  05/24/2021   Procedure: BRONCHIAL NEEDLE ASPIRATION BIOPSIES;  Surgeon: [name], DO;  Location: [facility];  Service: Pulmonary;;   BUNIONECTOMY     both   FIDUCIAL MARKER PLACEMENT  05/24/2021   Procedure: FIDUCIAL DYE MARKING;  Surgeon: [name], DO;  Location: [facility];  Service: Pulmonary;;   FINGER ARTHROPLASTY Right 07/30/2012   Procedure: RIGHT INDEX FINGER, MIDDLE FINGER, RING FINGER, SMALL FINGER MCP ARTHROPLASTY WITH REALIGNMENT/REPAIR OF EXTENSOR TENDON;  Surgeon: Roseanne Kaufman, MD;  Location: [facility];  Service: Orthopedics;  Laterality: Right;   FINGER SURGERY     HAMMER TOE SURGERY Left 03/03/2014   Procedure: SECOND THROUGH FIFTH HAMMERTOE CORRECTION;  Surgeon: Wylene Simmer, MD;  Location: [facility];  Service: Orthopedics;  Laterality: Left;   INTERCOSTAL NERVE BLOCK Left 05/24/2021   Procedure: INTERCOSTAL NERVE BLOCK;  Surgeon: Lajuana Matte, MD;  Location: [facility];  Service: Thoracic;  Laterality: Left;   JOINT REPLACEMENT     LYMPH NODE DISSECTION Left 05/24/2021   Procedure: LYMPH NODE DISSECTION;  Surgeon: Lajuana Matte, MD;  Location: [facility];  Service: Thoracic;  Laterality: Left;   METATARSAL HEAD EXCISION Left 03/03/2014   Procedure: LEFT SECOND THROUGH FIFTH METATARSAL HEAD EXCISION;  Surgeon: Wylene Simmer, MD;  Location: [facility];  Service: Orthopedics;  Laterality: Left;   OOPHORECTOMY     VIDEO BRONCHOSCOPY Bilateral 12/18/2016   Procedure: VIDEO BRONCHOSCOPY WITH FLUORO;  Surgeon: Tanda Rockers, MD;  Location: [facility];  Service:  Cardiopulmonary;  Laterality: Bilateral;   VIDEO BRONCHOSCOPY WITH ENDOBRONCHIAL NAVIGATION Left 02/26/2017   Procedure: VIDEO BRONCHOSCOPY WITH ENDOBRONCHIAL NAVIGATION;  Surgeon: Collene Gobble, MD;  Location: [facility];  Service: Thoracic;  Laterality: Left;   VIDEO BRONCHOSCOPY WITH RADIAL ENDOBRONCHIAL ULTRASOUND  05/24/2021   Procedure: VIDEO BRONCHOSCOPY WITH RADIAL ENDOBRONCHIAL ULTRASOUND;  Surgeon: [name], DO;  Location: [facility];  Service: Pulmonary;;    Family History  Problem Relation Age of Onset   Dementia Mother    Sleep apnea Mother    Hyperthyroidism Mother    Dementia Father    Sleep apnea Father     Social History Social History   Tobacco Use   Smoking status: Never   Smokeless tobacco: Never  Substance Use Topics   Alcohol use: No    Alcohol/week: 0.0 standard drinks   Drug use: No    Allergies  Allergen Reactions   Aspirin Other (See Comments)    Ringing in ears with large doses, large doses causes her to lose hearing   Ubrogepant Nausea And Vomiting   Venlafaxine     Headaches    Current Outpatient Medications  Medication Sig Dispense Refill   acetaminophen (TYLENOL) 500 MG tablet Take 500 mg by mouth every 6 (six) hours as needed.     alendronate (FOSAMAX) 70 MG tablet Take 70 mg by mouth once a week.  3   aspirin EC 81 MG EC tablet Take 1 tablet (81 mg total) by mouth daily. Swallow whole. 30 tablet 11   atorvastatin (LIPITOR) 80 MG tablet Take 1 tablet (80 mg total) by mouth daily. 30 tablet 1   benzonatate (TESSALON) 100 MG capsule 1 capsule as needed     cholecalciferol (VITAMIN D3) 25 MCG (1000 UNIT) tablet Take 1,000 Units by mouth daily.     Cyanocobalamin (B-12 PO) Take 1 tablet by mouth daily.     fluticasone (FLONASE) 50 MCG/ACT nasal spray Place 1 spray into both nostrils daily as needed for allergies.     folic acid (FOLVITE) 1 MG tablet Take 1 mg by mouth daily.     inFLIXimab (REMICADE) 100 MG injection Inject into the  vein every 8 (eight) weeks.      Iron, Ferrous Sulfate, 325 (65 Fe) MG TABS 1 tablet     levocetirizine (XYZAL) 5 MG tablet Take 5 mg by mouth at bedtime as needed for allergies.     Magnesium 400 MG CAPS Take 400 mg by mouth every other day.     methotrexate (RHEUMATREX) 2.5 MG tablet Take 15 mg by mouth every Thursday.     metoprolol tartrate (LOPRESSOR) 25 MG tablet Take 1 tablet (25 mg total) by mouth 2 (two) times daily. 60 tablet 3   montelukast (SINGULAIR) 10 MG tablet Take 10 mg by mouth daily.     olopatadine (PATANOL) 0.1 % ophthalmic solution Place 1 drop into both eyes daily as needed for allergies.     ondansetron (ZOFRAN ODT) 4 MG disintegrating tablet Take 1 tablet (4 mg total) by mouth every 8 (eight) hours as needed for nausea or vomiting. 20 tablet 6   pantoprazole (PROTONIX) 40 MG tablet Take 40 mg by mouth every morning.     Polyethyl Glycol-Propyl Glycol (SYSTANE OP) Place 1 drop into both eyes as needed (for dry eyes).     Rimegepant Sulfate (NURTEC) 75 MG TBDP Take 1 tab at onset of migraine.  May repeat in 2 hrs, if needed.  Max dose: 2 tabs/day. This is a 30 day prescription. 12 tablet 11   traMADol (ULTRAM) 50 MG tablet Take 1 tablet (50 mg total) by mouth every 6 (six) hours as needed for moderate pain. 30 tablet 0   triamcinolone cream (KENALOG) 0.1 % Apply 1 application topically as needed (for skin irritation).     triamterene-hydrochlorothiazide (MAXZIDE-25) 37.5-25 MG tablet Take 1 tablet by mouth daily. 30 tablet 3   No current facility-administered medications for this visit.    Review of Systems  Constitutional: positive for anorexia, fatigue, and weight loss Eyes: negative Ears, nose, mouth, throat, and face: negative Respiratory: positive for cough, dyspnea on exertion, and pleurisy/chest pain Cardiovascular: negative Gastrointestinal: positive for constipation and nausea Genitourinary:negative Integument/breast: negative Hematologic/lymphatic:  negative Musculoskeletal:negative Neurological: negative Behavioral/Psych: negative Endocrine: negative Allergic/Immunologic: negative  Physical Exam  EHU:DJSHF, healthy, no distress, well nourished, and well developed SKIN: skin color, texture, turgor are normal, no rashes or significant lesions HEAD: Normocephalic, No masses, lesions, tenderness or abnormalities EYES: normal, PERRLA, Conjunctiva are pink and non-injected EARS: External ears normal, Canals clear OROPHARYNX:no exudate, no erythema, and lips, buccal mucosa, and tongue normal  NECK: supple, no adenopathy,  no JVD LYMPH:  no palpable lymphadenopathy, no hepatosplenomegaly BREAST:not examined LUNGS: clear to auscultation , and palpation HEART: regular rate & rhythm, no murmurs, and no gallops ABDOMEN:abdomen soft, non-tender, normal bowel sounds, and no masses or organomegaly BACK: Back symmetric, no curvature., No CVA tenderness EXTREMITIES:no joint deformities, effusion, or inflammation, no edema  NEURO: alert & oriented x 3 with fluent speech, no focal motor/sensory deficits  PERFORMANCE STATUS: ECOG 1  LABORATORY DATA: Lab Results  Component Value Date   WBC 13.9 (H) 05/28/2021   HGB 10.0 (L) 05/28/2021   HCT 28.7 (L) 05/28/2021   MCV 83.7 05/28/2021   PLT 194 05/28/2021      Chemistry      Component Value Date/Time   NA 136 05/28/2021 0054   NA 141 08/29/2020 1511   K 3.3 (L) 05/28/2021 0054   CL 102 05/28/2021 0054   CO2 26 05/28/2021 0054   BUN 28 (H) 05/28/2021 0054   BUN 24 08/29/2020 1511   CREATININE 1.12 (H) 05/28/2021 0054      Component Value Date/Time   CALCIUM 7.6 (L) 05/28/2021 0054   ALKPHOS 31 (L) 05/26/2021 0118   AST 23 05/26/2021 0118   ALT 7 05/26/2021 0118   BILITOT 0.3 05/26/2021 0118       RADIOGRAPHIC STUDIES: DG Chest 2 View  Result Date: 05/30/2021 CLINICAL DATA:  Status post chest tube removal EXAM: CHEST - 2 VIEW COMPARISON:  05/28/2021 FINDINGS: Heart size is  within normal limits. Very small residual left pneumothorax is not significantly changed status post chest tube removal. Minimal subcutaneous emphysema remains in the left lateral chest wall. Nodular opacity at the right lung base consistent with nipple shadow given no nodules seen at this site on recent prior CT. IMPRESSION: Minimal, unchanged residual left apical pneumothorax status post left chest tube removal. Electronically Signed   By: Miachel Roux M.D.   On: 05/30/2021 08:11   DG Chest Port 1 View  Result Date: 05/28/2021 CLINICAL DATA:  Follow-up left chest tube, left lower lobectomy. EXAM: PORTABLE CHEST 1 VIEW COMPARISON:  Portable chest yesterday at 5:33 a.m. FINDINGS: Heart size and vasculature are normal. Stable mediastinum with mild aortic uncoiling. Small left apicolateral pneumothorax persists, again estimated 3% or less and there is adjacent left chest wall emphysema which appears improved. Left chest tube with tip in the apex remains unchanged. There is stable opacity at the left base which is most likely atelectasis and a small pleural effusion. Rest of the lungs emphysematous and otherwise clear. Slight thoracic dextroscoliosis. IMPRESSION: Stable overall aeration. Persistent minimal left apicolateral pneumothorax estimated 3% or less, with improvement in left chest wall emphysema. Opacity at the left base and small left effusion remain. No new abnormality. Electronically Signed   By: Telford Nab M.D.   On: 05/28/2021 06:52    ASSESSMENT: This is a very pleasant 76 years old and never smoker white female recently diagnosed with a stage Ia (T1c, N0, M0) non-small cell lung cancer, adenocarcinoma presented with left lower lobe lung mass status post left lower lobectomy with lymph node sampling on May 24, 2021 under the care of Dr. Kipp Brood.   PLAN: I had a lengthy discussion with the patient and her spouse today about her current disease stage, prognosis and treatment options. I  personally and independently reviewed the previous scan images as well as the pathology report. I explained to the patient that she had a curable treatment for her condition with the surgical resection. I discussed  with the patient her prognosis for the stage Ia non-small cell lung cancer and she understands that the 5-year survival is around 80%. I also explained to the patient that there is no survival benefit for adjuvant systemic chemotherapy for patient with a stage Ia non-small cell lung cancer and the current standard of care is observation and close monitoring. I recommended for the patient to have repeat CT scan of the chest in 6 months for restaging of her disease. For the weight loss, she is scheduled to see a dietitian for evaluation. The patient was advised to call immediately if she has any other concerning symptoms in the interval. The patient voices understanding of current disease status and treatment options and is in agreement with the current care plan.  All questions were answered. The patient knows to call the clinic with any problems, questions or concerns. We can certainly see the patient much sooner if necessary.  Thank you so much for allowing me to participate in the care of Kendra Taylor. I will continue to follow up the patient with you and assist in her care.  The total time spent in the appointment was 60 minutes.  Disclaimer: This note was dictated with voice recognition software. Similar sounding words can inadvertently be transcribed and may not be corrected upon review.   Eilleen Kempf June 26, 2021, 2:35 PM

## 2021-06-26 NOTE — Progress Notes (Signed)
Oncology Nurse Navigator Documentation  Oncology Nurse Navigator Flowsheets 06/26/2021  Confirmed Diagnosis Date 05/24/2021  Diagnosis Status Confirmed Diagnosis Complete  Planned Course of Treatment Surgery  Phase of Treatment Surgery  Surgery Actual Start Date: 05/24/2021  Navigator Location [facility]  Referral Date to RadOnc/MedOnc 06/07/2021  Navigator Encounter Type Clinic/MDC;Initial MedOnc/I met Ms. Broecker today.  She was dx with stage I lung cancer and tx plan is follow up every six months with a CT scan.  I helped to explain tx plan and added lung cancer information to her AVS.   Treatment Initiated Date 05/24/2021  Patient Visit Type Initial;MedOnc  Treatment Phase Other  Barriers/Navigation Needs Education  Education Other  Interventions Education;Psycho-Social Support  Acuity Level 2-Minimal Needs (1-2 Barriers Identified)  Education Method Verbal;Other  Time Spent with Patient 30

## 2021-06-26 NOTE — Patient Instructions (Signed)
Lung Cancer Lung cancer is an abnormal growth of cancerous cells that forms a mass (malignant tumor) in a lung. There are several types of lung cancer. The types are based on the appearance of the tumor cells. The two most common types are: Non-small cell lung cancer. This type of lung cancer is the most common type. Non-small cell lung cancers include squamous cell carcinoma, adenocarcinoma, and large cell carcinoma. Small cell lung cancer. In this type of lung cancer, abnormal cells are smaller than those of non-small cell lung cancer. Small cell lung cancer gets worse (progresses) faster than non-small cell lung cancer. What are the causes? The most common cause of lung cancer is smoking tobacco. The second most common cause is exposure to a chemical called radon. What increases the risk? You are more likely to develop this condition if: You smoke tobacco. You have been exposed to: Secondhand tobacco smoke. Radon gas. Uranium. Asbestos. Arsenic in drinking water. Air pollution and diesel exhaust. You have a family or personal history of lung cancer. You have had lung radiation therapy in the past. You are older than age 78. What are the signs or symptoms? In the early stages, you may not have any symptoms. As the cancer progresses, symptoms may include: A lasting cough, possibly with blood. Fatigue. Unexplained weight loss. Shortness of breath. High-pitched whistling sounds when you breathe, most often when you breathe out (wheezing). Chest pain. Loss of appetite. Symptoms of advanced lung cancer include: Hoarseness. Bone or joint pain. Weakness. Change in the structure of the fingernails (clubbing), so that the nail looks like an upside-down spoon. Swelling of the face or arms. Inability to move the face (paralysis). Drooping eyelids. How is this diagnosed? This condition may be diagnosed based on: Your symptoms and medical history. A physical exam. A chest X-ray. A CT  scan. Blood tests. Sputum tests. Removal of a sample of lung tissue (lung biopsy) for testing. Your cancer will be assessed (staged) to determine how severe it is and how much it has spread (metastasized). How is this treated? Treatment depends on the type and stage of your cancer. Treatment may include one or more of the following: Surgery to remove as much of the cancer as possible. Lymph nodes in the area may be removed and tested for cancer as well. Medicines that kill cancer cells (chemotherapy). High-energy rays that kill cancer cells (radiation therapy). Targeted therapy. This targets specific parts of cancer cells and the area around them to block the growth and spread of the cancer. Targeted therapy can help limit the damage to healthy cells. Immunotherapy. This treatment uses a person's own immune system to fight cancer by either boosting the immune system or changing how the immune system works. Follow these instructions at home:  Do not use any products that contain nicotine or tobacco. These products include cigarettes, chewing tobacco, and vaping devices, such as e-cigarettes. If you need help quitting, ask your health care provider. Do not drink alcohol. If you are admitted to the hospital, make sure your cancer specialist (oncologist) is aware. Your cancer may affect your treatment for other conditions. Take over-the-counter and prescription medicines only as told by your health care provider. Work with your health care provider to manage any side effects of treatment. Keep all follow-up visits. This is important. Where to find support Consider joining a local support group for people who have been diagnosed with lung cancer. Where to find more information American Cancer Society: www.cancer.[place]):  www.cancer.gov Contact a health care provider if you: Lose weight without trying. Have a persistent cough and wheezing. Feel short of breath. Get  tired easily. Have bone or joint pain. Have difficulty swallowing. Notice that your voice is changing or getting hoarse. Have pain that does not get better with medicine. Get help right away if you: Cough up blood. Have chest pain or new breathing problems. Have a fever. Have swelling in an ankle, leg, or arm, or the face or neck. Have paralysis in your face. Are very confused. Have a drooping eyelid. These symptoms may represent a serious problem that is an emergency. Do not wait to see if the symptoms will go away. Get medical help right away. Call your local emergency services (911 in the [place] Do not drive yourself to the hospital. Summary Lung cancer is an abnormal growth of cancerous cells that forms a mass (malignant tumor) in a lung. There are several types of lung cancer. The types are based on the appearance of the tumor cells. The two most common types are non-small cell and small cell. The most common cause of lung cancer is smoking tobacco. Early symptoms include a lasting cough, possibly with blood, and fatigue, unexplained weight loss, and shortness of breath. After diagnosis, treatment depends on the type and stage of your cancer. This information is not intended to replace advice given to you by your health care provider. Make sure you discuss any questions you have with your health care provider. Document Revised: 10/04/2020 Document Reviewed: 10/04/2020 Elsevier Patient Education  [place].

## 2021-06-27 DIAGNOSIS — R634 Abnormal weight loss: Secondary | ICD-10-CM | POA: Diagnosis not present

## 2021-06-27 DIAGNOSIS — K219 Gastro-esophageal reflux disease without esophagitis: Secondary | ICD-10-CM | POA: Diagnosis not present

## 2021-07-01 ENCOUNTER — Encounter: Payer: Self-pay | Admitting: Dermatology

## 2021-07-01 NOTE — Progress Notes (Signed)
° °  Follow-Up Visit   Subjective  Kendra Taylor is a 76 y.o. female who presents for the following: Skin Problem (Pt has had MOHs done in the Right ear in the past. Pt states that it feels weird and would like to have it evaluated to make sure its not coming back ).  Check Mohs site plus other spot on scalp Location:  Duration:  Quality:  Associated Signs/Symptoms: Modifying Factors:  Severity:  Timing: Context:   Objective  Well appearing patient in no apparent distress; mood and affect are within normal limits. Right Cavum Sign recurrent carcinoma.  I suspect he has post Mohs surgery dysesthesia.  Mid Parietal Scalp Tan textured flattopped 7 mm papule, typical dermoscopy    A focused examination was performed including head and neck. Relevant physical exam findings are noted in the Assessment and Plan.   Assessment & Plan    Scar tissue Right Cavum  Recheck.  Clinical change.  Otherwise annual examination.  Seborrheic keratosis Mid Parietal Scalp  Leave if stable      I, Lavonna Monarch, MD, have reviewed all documentation for this visit.  The documentation on 07/01/21 for the exam, diagnosis, procedures, and orders are all accurate and complete.

## 2021-07-02 ENCOUNTER — Ambulatory Visit: Payer: MEDICARE | Admitting: Physician Assistant

## 2021-07-11 ENCOUNTER — Ambulatory Visit (INDEPENDENT_AMBULATORY_CARE_PROVIDER_SITE_OTHER): Payer: MEDICARE | Admitting: Neurology

## 2021-07-11 ENCOUNTER — Other Ambulatory Visit: Payer: Self-pay | Admitting: Thoracic Surgery (Cardiothoracic Vascular Surgery)

## 2021-07-11 ENCOUNTER — Encounter: Payer: Self-pay | Admitting: Neurology

## 2021-07-11 VITALS — BP 131/85 | HR 94 | Ht 61.0 in | Wt 101.5 lb

## 2021-07-11 DIAGNOSIS — D487 Neoplasm of uncertain behavior of other specified sites: Secondary | ICD-10-CM | POA: Insufficient documentation

## 2021-07-11 DIAGNOSIS — N644 Mastodynia: Secondary | ICD-10-CM | POA: Insufficient documentation

## 2021-07-11 DIAGNOSIS — H9190 Unspecified hearing loss, unspecified ear: Secondary | ICD-10-CM | POA: Insufficient documentation

## 2021-07-11 DIAGNOSIS — C3492 Malignant neoplasm of unspecified part of left bronchus or lung: Secondary | ICD-10-CM | POA: Insufficient documentation

## 2021-07-11 DIAGNOSIS — N393 Stress incontinence (female) (male): Secondary | ICD-10-CM | POA: Insufficient documentation

## 2021-07-11 DIAGNOSIS — R030 Elevated blood-pressure reading, without diagnosis of hypertension: Secondary | ICD-10-CM | POA: Insufficient documentation

## 2021-07-11 DIAGNOSIS — F419 Anxiety disorder, unspecified: Secondary | ICD-10-CM

## 2021-07-11 DIAGNOSIS — D72829 Elevated white blood cell count, unspecified: Secondary | ICD-10-CM | POA: Insufficient documentation

## 2021-07-11 DIAGNOSIS — Z8673 Personal history of transient ischemic attack (TIA), and cerebral infarction without residual deficits: Secondary | ICD-10-CM | POA: Insufficient documentation

## 2021-07-11 DIAGNOSIS — D509 Iron deficiency anemia, unspecified: Secondary | ICD-10-CM | POA: Insufficient documentation

## 2021-07-11 DIAGNOSIS — R634 Abnormal weight loss: Secondary | ICD-10-CM | POA: Insufficient documentation

## 2021-07-11 DIAGNOSIS — R918 Other nonspecific abnormal finding of lung field: Secondary | ICD-10-CM | POA: Insufficient documentation

## 2021-07-11 DIAGNOSIS — G43709 Chronic migraine without aura, not intractable, without status migrainosus: Secondary | ICD-10-CM

## 2021-07-11 DIAGNOSIS — G43909 Migraine, unspecified, not intractable, without status migrainosus: Secondary | ICD-10-CM | POA: Insufficient documentation

## 2021-07-11 DIAGNOSIS — J309 Allergic rhinitis, unspecified: Secondary | ICD-10-CM | POA: Insufficient documentation

## 2021-07-11 NOTE — Patient Instructions (Signed)
Continue to see PCP ?Recommend aspirin 81 mg daily for stroke prevention  ?Continue Nurtec as needed for acute headache ?See you back as needed ?

## 2021-07-11 NOTE — Progress Notes (Addendum)
? ? ?PATIENT: Kendra Taylor ?DOB: 08/05/1945 ? ?REASON FOR VISIT: Follow up for migraine, TIA ?HISTORY FROM: Patient ?PRIMARY NEUROLOGIST: Dr. Krista Blue  ? ?ASSESSMENT AND PLAN ?76 y.o. year old female  ? ?1.  Chronic migraine headaches ?-Under good control, none since January  ?-Continue Nurtec as needed  ?-Not a candidate for triptan, Roselyn Meier made her vomit  ?-May consider Fioricet as alternative if headaches remain rare ? ?2.  TIA December 25, 2020 ?-Transient right eye visual loss, expressive aphasia ?-MRI of the brain, MRA of the head was unremarkable, carotid ultrasound showed no large vessel disease ?-Echocardiogram showed no significant abnormalities ?-Continue aspirin 81 mg daily ?-Continue follow-up with PCP for management of vascular risk factors ? ?HISTORY  ?Kendra Taylor, is a 76 year old female, seen in request by her primary care physician Dr. Deland Pretty, to follow-up for hospital discharge, she is accompanied by her partner at today's visit on January 08, 2021 ?  ?I reviewed and summarized the referring note.  Past medical history ?Rheumatoid arthritis, taking methotrexate 2.5 mg 6 tablets weekly, Remicade, ?Osteoporosis, ?Hyperlipidemia ?Chronic migraine headaches ?  ?I saw her in 2015 for frequent severe migraine headaches, she was put on Imitrex as needed, which has been very effective, it only happens every few months ?  ?She was admitted to hospital following an episode on December 25, 2020, at nighttime, she had a sudden onset loss of the right visual field, when she was talking, was noted to be gibberish, lasting for 20 minutes, denies headache with it, her previous migraine does not have aura, ?  ?When she talk with her friends about her symptoms next day December 26, 2020, worry about the stroke, she to the ER, leading to hospital admission, extensive evaluations, ?  ?Personally reviewed MRI of the brain December 26, 2020 that was normal ?MRI of the brain showed no large vessel  disease ?Ultrasound of carotid artery less than 39% stenosis bilateral internal carotid artery, antegrade flow of bilateral vertebral system ?Echocardiogram ejection fraction 60%, mild aortic valve leaflet calcification ?  ?Laboratory evaluation showed normal ESR C-reactive protein, A1c of 5.3, LDL of 110 ?  ?She was discharged with aspirin 81 mg and Lipitor 80 mg daily ?  ?She had 1 episode of migraine recently, was given a trial of Ubrelvy 50 mg, she had significant nausea right after she took medications, headache last about 18 hours, multiple nausea vomiting episode ? ?Update July 11, 2021 SS: Here today alone, stopped aspirin on her own. Migraines are non-existent. January 2023, had left lobectomy for lung cancer. Weight loss, down to 100 lbs, seeing dietitian. Working with oncology, in surveillance stage. Has Nurtec, it works favorable, is somewhat slow. Imitrex worked very fast. Sometimes forgetfulness since TIA event, it comes back. ?  ?REVIEW OF SYSTEMS: Out of a complete 14 system review of symptoms, the patient complains only of the following symptoms, and all other reviewed systems are negative. ? ?See HPI ? ?ALLERGIES: ?Allergies  ?Allergen Reactions  ? Aspirin Other (See Comments)  ?  Ringing in ears with large doses, large doses causes her to lose hearing  ? Ubrogepant Nausea And Vomiting  ? Venlafaxine   ?  Headaches  ? ? ?HOME MEDICATIONS: ?Outpatient Medications Prior to Visit  ?Medication Sig Dispense Refill  ? acetaminophen (TYLENOL) 500 MG tablet Take 500 mg by mouth every 6 (six) hours as needed.    ? alendronate (FOSAMAX) 70 MG tablet Take 70 mg by mouth once a week.  3  ? atorvastatin (LIPITOR) 80 MG tablet Take 1 tablet (80 mg total) by mouth daily. 30 tablet 1  ? cholecalciferol (VITAMIN D3) 25 MCG (1000 UNIT) tablet Take 1,000 Units by mouth daily.    ? Cyanocobalamin (B-12 PO) Take 1 tablet by mouth daily.    ? fluticasone (FLONASE) 50 MCG/ACT nasal spray Place 1 spray into both nostrils  daily as needed for allergies.    ? folic acid (FOLVITE) 1 MG tablet Take 1 mg by mouth daily.    ? Iron, Ferrous Sulfate, 325 (65 Fe) MG TABS 1 tablet    ? levocetirizine (XYZAL) 5 MG tablet Take 5 mg by mouth at bedtime as needed for allergies.    ? Magnesium 400 MG CAPS Take 400 mg by mouth every other day.    ? methotrexate (RHEUMATREX) 2.5 MG tablet Take 15 mg by mouth every Thursday.    ? metoprolol tartrate (LOPRESSOR) 25 MG tablet Take 1 tablet (25 mg total) by mouth 2 (two) times daily. 60 tablet 3  ? montelukast (SINGULAIR) 10 MG tablet Take 10 mg by mouth daily.    ? olopatadine (PATANOL) 0.1 % ophthalmic solution Place 1 drop into both eyes daily as needed for allergies.    ? ondansetron (ZOFRAN ODT) 4 MG disintegrating tablet Take 1 tablet (4 mg total) by mouth every 8 (eight) hours as needed for nausea or vomiting. 20 tablet 6  ? pantoprazole (PROTONIX) 40 MG tablet Take 40 mg by mouth every morning.    ? Polyethyl Glycol-Propyl Glycol (SYSTANE OP) Place 1 drop into both eyes as needed (for dry eyes).    ? Rimegepant Sulfate (NURTEC) 75 MG TBDP Take 1 tab at onset of migraine.  May repeat in 2 hrs, if needed.  Max dose: 2 tabs/day. This is a 30 day prescription. 12 tablet 11  ? triamcinolone cream (KENALOG) 0.1 % Apply 1 application topically as needed (for skin irritation).    ? aspirin EC 81 MG EC tablet Take 1 tablet (81 mg total) by mouth daily. Swallow whole. 30 tablet 11  ? benzonatate (TESSALON) 100 MG capsule 1 capsule as needed    ? inFLIXimab (REMICADE) 100 MG injection Inject into the vein every 8 (eight) weeks.  (Patient not taking: Reported on 06/26/2021)    ? triamterene-hydrochlorothiazide (MAXZIDE-25) 37.5-25 MG tablet Take 1 tablet by mouth daily. 30 tablet 3  ? ?No facility-administered medications prior to visit.  ? ? ?PAST MEDICAL HISTORY: ?Past Medical History:  ?Diagnosis Date  ? Anemia   ? Anxiety   ? Arthritis   ? Atypical mole 01/08/2013  ? right inner forearm  ? Basal cell  carcinoma 03/12/2017  ? right concha exc mohs  ? Depression   ? Dyspnea   ? GERD (gastroesophageal reflux disease)   ? HA (headache)   ? Migraine   ? Sleep apnea   ? cpap  ? ? ?PAST SURGICAL HISTORY: ?Past Surgical History:  ?Procedure Laterality Date  ? ABDOMINAL HYSTERECTOMY    ? BRONCHIAL BIOPSY  05/24/2021  ? Procedure: BRONCHIAL BIOPSIES;  Surgeon: [name], DO;  Location: [facility];  Service: Pulmonary;;  ? BRONCHIAL BRUSHINGS  05/24/2021  ? Procedure: BRONCHIAL BRUSHINGS;  Surgeon: [name], DO;  Location: [facility];  Service: Pulmonary;;  ? BRONCHIAL NEEDLE ASPIRATION BIOPSY  05/24/2021  ? Procedure: BRONCHIAL NEEDLE ASPIRATION BIOPSIES;  Surgeon: [name], DO;  Location: [facility];  Service: Pulmonary;;  ? BUNIONECTOMY    ? both  ?  FIDUCIAL MARKER PLACEMENT  05/24/2021  ? Procedure: FIDUCIAL DYE MARKING;  Surgeon: [name], DO;  Location: [facility];  Service: Pulmonary;;  ? FINGER ARTHROPLASTY Right 07/30/2012  ? Procedure: RIGHT INDEX FINGER, MIDDLE FINGER, RING FINGER, SMALL FINGER MCP ARTHROPLASTY WITH REALIGNMENT/REPAIR OF EXTENSOR TENDON;  Surgeon: Roseanne Kaufman, MD;  Location: [facility];  Service: Orthopedics;  Laterality: Right;  ? FINGER SURGERY    ? HAMMER TOE SURGERY Left 03/03/2014  ? Procedure: SECOND THROUGH FIFTH HAMMERTOE CORRECTION;  Surgeon: Wylene Simmer, MD;  Location: [facility];  Service: Orthopedics;  Laterality: Left;  ? INTERCOSTAL NERVE BLOCK Left 05/24/2021  ? Procedure: INTERCOSTAL NERVE BLOCK;  Surgeon: Lajuana Matte, MD;  Location: [facility];  Service: Thoracic;  Laterality: Left;  ? JOINT REPLACEMENT    ? LYMPH NODE DISSECTION Left 05/24/2021  ? Procedure: LYMPH NODE DISSECTION;  Surgeon: Lajuana Matte, MD;  Location: [facility];  Service: Thoracic;  Laterality: Left;  ? METATARSAL HEAD EXCISION Left 03/03/2014  ? Procedure: LEFT SECOND THROUGH FIFTH METATARSAL HEAD EXCISION;  Surgeon: Wylene Simmer, MD;  Location: [facility];  Service: Orthopedics;  Laterality: Left;  ? OOPHORECTOMY    ? VIDEO BRONCHOSCOPY Bilateral 12/18/2016  ? Procedure: VIDEO BRONCHOSCOPY WITH FLUORO;  Surgeon: Tanda Rockers, MD;  Location: [facility]

## 2021-07-13 ENCOUNTER — Other Ambulatory Visit: Payer: Self-pay

## 2021-07-13 ENCOUNTER — Ambulatory Visit (INDEPENDENT_AMBULATORY_CARE_PROVIDER_SITE_OTHER): Payer: Self-pay | Admitting: Thoracic Surgery (Cardiothoracic Vascular Surgery)

## 2021-07-13 ENCOUNTER — Ambulatory Visit
Admission: RE | Admit: 2021-07-13 | Discharge: 2021-07-13 | Disposition: A | Discharge status: Home / Self Care | Payer: MEDICARE | Source: Ambulatory Visit | Attending: Thoracic Surgery (Cardiothoracic Vascular Surgery) | Admitting: Thoracic Surgery (Cardiothoracic Vascular Surgery)

## 2021-07-13 VITALS — BP 108/73 | HR 90 | Resp 20 | Ht 61.0 in | Wt 101.0 lb

## 2021-07-13 DIAGNOSIS — C3490 Malignant neoplasm of unspecified part of unspecified bronchus or lung: Secondary | ICD-10-CM | POA: Diagnosis not present

## 2021-07-13 DIAGNOSIS — C3492 Malignant neoplasm of unspecified part of left bronchus or lung: Secondary | ICD-10-CM

## 2021-07-13 DIAGNOSIS — Z09 Encounter for follow-up examination after completed treatment for conditions other than malignant neoplasm: Secondary | ICD-10-CM

## 2021-07-13 NOTE — Progress Notes (Signed)
? ?   ?  [address] ?      [place] [address] ?            [phone]       ? ?Kendra Taylor ?[facility] Record #[id number] ?Date of Birth: 02-03-1946 ? ?Referring: [name], DO ?Primary Care: Deland Pretty, MD ?Primary Cardiologist:None ? ?Reason for visit:   follow-up ? ?History of Present Illness:     ?Kendra Taylor presents for 1 month follow-up appointment.  She complains of an occasional cough.  Her energy is back to baseline.  Her appetite is also improved.  She denies any shortness of breath ? ?Physical Exam: ?BP 108/73   Pulse 90   Resp 20   Ht 5\' 1"  (1.549 m)   Wt 101 lb (45.8 kg)   SpO2 97%   BMI 19.08 kg/m?  ? ?Alert NAD ?Incision well-healed.   ?Abdomen, ND ?No peripheral edema ? ? ?Diagnostic Studies & Laboratory data: ?CXR: Small to moderate left pleural effusion ? ?  ? 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  She has has a residual left-sided pleural effusion.  I have referred her to pulmonary for thoracentesis.  I will follow-up with her virtually after a chest x-ray in 1 month.  She will continue her surveillance with the medical oncology service. ? ? ?Kendra Taylor ?07/13/2021 7:50 PM ? ? ? ? ? ?Mrs. ?

## 2021-07-17 DIAGNOSIS — M0589 Other rheumatoid arthritis with rheumatoid factor of multiple sites: Secondary | ICD-10-CM | POA: Diagnosis not present

## 2021-07-17 DIAGNOSIS — D508 Other iron deficiency anemias: Secondary | ICD-10-CM | POA: Diagnosis not present

## 2021-07-17 DIAGNOSIS — R634 Abnormal weight loss: Secondary | ICD-10-CM | POA: Diagnosis not present

## 2021-07-17 DIAGNOSIS — D72829 Elevated white blood cell count, unspecified: Secondary | ICD-10-CM | POA: Diagnosis not present

## 2021-07-26 ENCOUNTER — Ambulatory Visit (INDEPENDENT_AMBULATORY_CARE_PROVIDER_SITE_OTHER): Payer: MEDICARE | Admitting: Pulmonary Disease

## 2021-07-26 ENCOUNTER — Other Ambulatory Visit (HOSPITAL_COMMUNITY)
Admission: RE | Admit: 2021-07-26 | Discharge: 2021-07-26 | Disposition: A | Discharge status: Home / Self Care | Payer: MEDICARE | Source: Ambulatory Visit | Attending: Pulmonary Disease | Admitting: Pulmonary Disease

## 2021-07-26 ENCOUNTER — Encounter: Payer: Self-pay | Admitting: Pulmonary Disease

## 2021-07-26 ENCOUNTER — Ambulatory Visit (INDEPENDENT_AMBULATORY_CARE_PROVIDER_SITE_OTHER): Payer: MEDICARE

## 2021-07-26 VITALS — BP 120/68 | HR 95 | Temp 97.7°F | Ht 61.0 in | Wt 103.8 lb

## 2021-07-26 DIAGNOSIS — Z789 Other specified health status: Secondary | ICD-10-CM

## 2021-07-26 DIAGNOSIS — C3492 Malignant neoplasm of unspecified part of left bronchus or lung: Secondary | ICD-10-CM

## 2021-07-26 DIAGNOSIS — J90 Pleural effusion, not elsewhere classified: Secondary | ICD-10-CM

## 2021-07-26 DIAGNOSIS — Z85828 Personal history of other malignant neoplasm of skin: Secondary | ICD-10-CM | POA: Diagnosis not present

## 2021-07-26 DIAGNOSIS — Z902 Acquired absence of lung [part of]: Secondary | ICD-10-CM

## 2021-07-26 DIAGNOSIS — R911 Solitary pulmonary nodule: Secondary | ICD-10-CM

## 2021-07-26 DIAGNOSIS — Z9889 Other specified postprocedural states: Secondary | ICD-10-CM | POA: Diagnosis not present

## 2021-07-26 DIAGNOSIS — J9811 Atelectasis: Secondary | ICD-10-CM | POA: Diagnosis not present

## 2021-07-26 DIAGNOSIS — J948 Other specified pleural conditions: Secondary | ICD-10-CM | POA: Diagnosis not present

## 2021-07-26 LAB — BODY FLUID CELL COUNT WITH DIFFERENTIAL
Eos, Fluid: 1 %
Lymphs, Fluid: 91 %
Monocyte-Macrophage-Serous Fluid: 3 % — ABNORMAL LOW (ref 50–90)
Neutrophil Count, Fluid: 5 % (ref 0–25)
Total Nucleated Cell Count, Fluid: 693 cu mm (ref 0–1000)

## 2021-07-26 LAB — LACTATE DEHYDROGENASE, PLEURAL OR PERITONEAL FLUID: LD, Fluid: 384 U/L — ABNORMAL HIGH (ref 3–23)

## 2021-07-26 NOTE — Patient Instructions (Signed)
Thank you for visiting Dr. Valeta Harms at [facility]. ?Today we recommend the following: ? ?Orders Placed This Encounter  ?Procedures  ? DG Chest 1 View  ? ?Return in about 6 months (around 01/26/2022) for w/ Dr. Valeta Harms . ? ? ? ?Please do your part to reduce the spread of COVID-19.  ? ?

## 2021-07-26 NOTE — Progress Notes (Signed)
Thoracentesis  Procedure Note ? ?Kendra Taylor  ?[id number]  ?[birth year] ? ?Date:07/26/21  ?Time:10:19 AM  ? ?Provider Performing:Aaryav Hopfensperger L Kiearra Oyervides  ? ?Procedure: Thoracentesis with imaging guidance (32023) ? ?Indication(s) ?Pleural Effusion ? ?Consent ?Risks of the procedure as well as the alternatives and risks of each were explained to the patient and/or caregiver.  Consent for the procedure was obtained and is signed in the bedside chart ? ?Anesthesia ?Topical only with 1% lidocaine  ? ? ?Time Out ?Verified patient identification, verified procedure, site/side was marked, verified correct patient position, special equipment/implants available, medications/allergies/relevant history reviewed, required imaging and test results available. ? ? ?Sterile Technique ?Maximal sterile technique including full sterile barrier drape, hand hygiene, sterile gown, sterile gloves, mask, hair covering, sterile ultrasound probe cover (if used). ? ?Procedure Description ?Ultrasound was used to identify appropriate pleural anatomy for placement and overlying skin marked.  Area of drainage cleaned and draped in sterile fashion. Lidocaine was used to anesthetize the skin and subcutaneous tissue.  75 cc's of rusty appearing fluid was drained from the left pleural space. Catheter then removed and bandaid applied to site. ? ? ?Complications/Tolerance ?None; patient tolerated the procedure well. ?Chest X-ray is ordered to confirm no post-procedural complication. ? ? ?EBL ?Minimal ? ? ?Specimen(s) ?Pleural fluid ? ? ?Kendra Nash, DO ?[facility] ?07/26/2021 10:20 AM   ? ? ? ? ? ? ? ? ? ? ?

## 2021-07-26 NOTE — Addendum Note (Signed)
Addended by: Suzzanne Cloud E on: 07/26/2021 10:35 AM ? ? Modules accepted: Orders ? ?

## 2021-07-26 NOTE — Progress Notes (Signed)
? ?Synopsis: Referred in November 2022 for lung mass by Deland Pretty, MD ? ?Subjective:  ? ?PATIENT ID: Kendra Taylor GENDER: female DOB: 23-Oct-1945, MRN: [id number] ? ?Chief Complaint  ?Patient presents with  ? Follow-up  ?  Pt states her breathing has been doing okay since last visit. States her cough has subsided.  ? ? ? ?This is a 76 year old female with a past medical history of reflux, basal cell carcinoma, anxiety, rheumatoid arthritis on Remicade infusion.  Here for evaluation of a left lower lobe pulmonary nodule.  In 2018 she was taken for bronchoscopy for a left lower lobe pulmonary nodule.  There was no evidence of malignancy on tissue sampling.February 26, 2017 patient was taken for navigational bronchoscopy by Dr. Lamonte Sakai.  Biopsy of the lesion was negative.  She did not have any additional follow-up and failed to follow-up after the bronchoscopy.  She had a repeat coronary CT scan in May 2022.  This coronary CT scan revealed a 3.6 x 2.4 cm mass within the left lung.  Slightly enlarged from her October 2017 2018 CT. ? ?OV 04/04/2021: Here today for follow-up after recent nuclear medicine pet imaging. Patient had nuclear medicine pet image completed on 03/28/2021.  This revealed low-level metabolic uptake within the near 3 cm part solid left lower lobe lesion.  No associated adenopathy. ? ?OV 06/06/2021: Here today for follow-up after recent combined robotic assisted navigational bronchoscopy followed by robotic lobectomy with myself and Dr. Kipp Brood. Patient was diagnosed with adenocarcinoma of the lung.  Patient's surgical cytology and pathology specimens were positive for adenocarcinoma, acinar predominant, 2.7 cm margins negative, no visceral pleura involvement and 6 lymph nodes negative for metastatic carcinoma.  Patient was discharged from the hospital on 05/30/2021.  Has not seen Dr. Kipp Brood yet in the office.  Postop follow-up appointment scheduled for 06/08/2021.  Patient feels like she is  breathing better.  Since surgery.  Still sore on the chest. ? ?OV 07/26/2021: Here today for follow-up after recent combined surgery.  Patient did very well postoperatively did have a left-sided small pleural effusion was referred here today for thoracentesis.  From respiratory standpoint she is doing well.  She is back to most of her activities of daily living. ? ? ? ? ?Past Medical History:  ?Diagnosis Date  ? Anemia   ? Anxiety   ? Arthritis   ? Atypical mole 01/08/2013  ? right inner forearm  ? Basal cell carcinoma 03/12/2017  ? right concha exc mohs  ? Depression   ? Dyspnea   ? GERD (gastroesophageal reflux disease)   ? HA (headache)   ? Migraine   ? Sleep apnea   ? cpap  ?  ? ?Family History  ?Problem Relation Age of Onset  ? Dementia Mother   ? Sleep apnea Mother   ? Hyperthyroidism Mother   ? Dementia Father   ? Sleep apnea Father   ?  ? ?Past Surgical History:  ?Procedure Laterality Date  ? ABDOMINAL HYSTERECTOMY    ? BRONCHIAL BIOPSY  05/24/2021  ? Procedure: BRONCHIAL BIOPSIES;  Surgeon: [name], DO;  Location: [facility];  Service: Pulmonary;;  ? BRONCHIAL BRUSHINGS  05/24/2021  ? Procedure: BRONCHIAL BRUSHINGS;  Surgeon: [name], DO;  Location: [facility];  Service: Pulmonary;;  ? BRONCHIAL NEEDLE ASPIRATION BIOPSY  05/24/2021  ? Procedure: BRONCHIAL NEEDLE ASPIRATION BIOPSIES;  Surgeon: [name], DO;  Location: [facility];  Service: Pulmonary;;  ? BUNIONECTOMY    ? both  ?  FIDUCIAL MARKER PLACEMENT  05/24/2021  ? Procedure: FIDUCIAL DYE MARKING;  Surgeon: [name], DO;  Location: [facility];  Service: Pulmonary;;  ? FINGER ARTHROPLASTY Right 07/30/2012  ? Procedure: RIGHT INDEX FINGER, MIDDLE FINGER, RING FINGER, SMALL FINGER MCP ARTHROPLASTY WITH REALIGNMENT/REPAIR OF EXTENSOR TENDON;  Surgeon: Roseanne Kaufman, MD;  Location: [facility];  Service: Orthopedics;  Laterality: Right;  ? FINGER SURGERY    ? HAMMER TOE SURGERY Left 03/03/2014  ? Procedure: SECOND THROUGH FIFTH  HAMMERTOE CORRECTION;  Surgeon: Wylene Simmer, MD;  Location: [facility];  Service: Orthopedics;  Laterality: Left;  ? INTERCOSTAL NERVE BLOCK Left 05/24/2021  ? Procedure: INTERCOSTAL NERVE BLOCK;  Surgeon: Lajuana Matte, MD;  Location: [facility];  Service: Thoracic;  Laterality: Left;  ? JOINT REPLACEMENT    ? LYMPH NODE DISSECTION Left 05/24/2021  ? Procedure: LYMPH NODE DISSECTION;  Surgeon: Lajuana Matte, MD;  Location: [facility];  Service: Thoracic;  Laterality: Left;  ? METATARSAL HEAD EXCISION Left 03/03/2014  ? Procedure: LEFT SECOND THROUGH FIFTH METATARSAL HEAD EXCISION;  Surgeon: Wylene Simmer, MD;  Location: [facility];  Service: Orthopedics;  Laterality: Left;  ? OOPHORECTOMY    ? VIDEO BRONCHOSCOPY Bilateral 12/18/2016  ? Procedure: VIDEO BRONCHOSCOPY WITH FLUORO;  Surgeon: Tanda Rockers, MD;  Location: [facility];  Service: Cardiopulmonary;  Laterality: Bilateral;  ? VIDEO BRONCHOSCOPY WITH ENDOBRONCHIAL NAVIGATION Left 02/26/2017  ? Procedure: VIDEO BRONCHOSCOPY WITH ENDOBRONCHIAL NAVIGATION;  Surgeon: Collene Gobble, MD;  Location: [facility];  Service: Thoracic;  Laterality: Left;  ? VIDEO BRONCHOSCOPY WITH RADIAL ENDOBRONCHIAL ULTRASOUND  05/24/2021  ? Procedure: VIDEO BRONCHOSCOPY WITH RADIAL ENDOBRONCHIAL ULTRASOUND;  Surgeon: [name], DO;  Location: [facility];  Service: Pulmonary;;  ? ? ?Social History  ? ?Socioeconomic History  ? Marital status: Married  ?  Spouse name: Remo Lipps  ? Number of children: 0  ? Years of education: masters  ? Highest education level: Not on file  ?Occupational History  ?  Comment: Retired  ?Tobacco Use  ? Smoking status: Never  ? Smokeless tobacco: Never  ?Vaping Use  ? Vaping Use: Not on file  ?Substance and Sexual Activity  ? Alcohol use: No  ?  Alcohol/week: 0.0 standard drinks  ? Drug use: No  ? Sexual activity: Not on file  ?Other Topics Concern  ? Not on file  ?Social History Narrative  ? Patient lives at home with her  spouse ([place] Remo Lipps.) Patient has two cats.  ? 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  ? Caffeine One cup of coffee daily.  ? ?Social Determinants of Health  ? ?Financial Resource Strain: Not on file  ?Food Insecurity: Not on file  ?Transportation Needs: Not on file  ?Physical Activity: Not on file  ?Stress: Not on file  ?Social Connections: Not on file  ?Intimate Partner Violence: Not on file  ?  ? ?Allergies  ?Allergen Reactions  ? Aspirin Other (See Comments)  ?  Ringing in ears with large doses, large doses causes her to lose hearing  ? Ubrogepant Nausea And Vomiting  ? Venlafaxine   ?  Headaches  ?  ? ?Outpatient Medications Prior to Visit  ?Medication Sig Dispense Refill  ? acetaminophen (TYLENOL) 500 MG tablet Take 500 mg by mouth every 6 (six) hours as needed.    ? alendronate (FOSAMAX) 70 MG tablet Take 70 mg by mouth once a week.  3  ? aspirin EC 81 MG tablet  Take 81 mg by mouth daily. Swallow whole.    ? atorvastatin (LIPITOR) 80 MG tablet Take 1 tablet (80 mg total) by mouth daily. 30 tablet 1  ? cholecalciferol (VITAMIN D3) 25 MCG (1000 UNIT) tablet Take 1,000 Units by mouth daily.    ? Cyanocobalamin (B-12 PO) Take 1 tablet by mouth daily.    ? fluticasone (FLONASE) 50 MCG/ACT nasal spray Place 1 spray into both nostrils daily as needed for allergies.    ? folic acid (FOLVITE) 1 MG tablet Take 1 mg by mouth daily.    ? Iron, Ferrous Sulfate, 325 (65 Fe) MG TABS 1 tablet    ? levocetirizine (XYZAL) 5 MG tablet Take 5 mg by mouth at bedtime as needed for allergies.    ? Magnesium 400 MG CAPS Take 400 mg by mouth every other day.    ? methotrexate (RHEUMATREX) 2.5 MG tablet Take 15 mg by mouth every Thursday.    ? montelukast (SINGULAIR) 10 MG tablet Take 10 mg by mouth daily.    ? olopatadine (PATANOL) 0.1 % ophthalmic solution Place 1 drop into both eyes daily as needed for allergies.    ? ondansetron (ZOFRAN ODT) 4 MG disintegrating tablet Take 1 tablet (4 mg total) by mouth every 8  (eight) hours as needed for nausea or vomiting. 20 tablet 6  ? pantoprazole (PROTONIX) 40 MG tablet Take 40 mg by mouth every morning.    ? Polyethyl Glycol-Propyl Glycol (SYSTANE OP) Place 1 drop into both eyes a

## 2021-07-26 NOTE — Addendum Note (Signed)
Addended by: Valerie Salts on: 07/26/2021 10:46 AM ? ? Modules accepted: Orders ? ?

## 2021-07-26 NOTE — Addendum Note (Signed)
Addended by: Lorretta Harp on: 07/26/2021 10:25 AM ? ? Modules accepted: Orders ? ?

## 2021-07-26 NOTE — Addendum Note (Signed)
Addended by: Suzzanne Cloud E on: 07/26/2021 10:27 AM ? ? Modules accepted: Orders ? ?

## 2021-07-26 NOTE — Addendum Note (Signed)
Addended by: Suzzanne Cloud E on: 07/26/2021 10:23 AM ? ? Modules accepted: Orders ? ?

## 2021-07-27 LAB — CYTOLOGY - NON PAP

## 2021-07-28 LAB — PROTEIN, BODY FLUID (OTHER): Protein, Fluid: 2.8 g/dL

## 2021-07-30 DIAGNOSIS — R809 Proteinuria, unspecified: Secondary | ICD-10-CM | POA: Diagnosis not present

## 2021-07-31 LAB — BODY FLUID CULTURE

## 2021-08-01 DIAGNOSIS — M1990 Unspecified osteoarthritis, unspecified site: Secondary | ICD-10-CM | POA: Diagnosis not present

## 2021-08-01 DIAGNOSIS — M7070 Other bursitis of hip, unspecified hip: Secondary | ICD-10-CM | POA: Diagnosis not present

## 2021-08-01 DIAGNOSIS — M8580 Other specified disorders of bone density and structure, unspecified site: Secondary | ICD-10-CM | POA: Diagnosis not present

## 2021-08-01 DIAGNOSIS — K219 Gastro-esophageal reflux disease without esophagitis: Secondary | ICD-10-CM | POA: Diagnosis not present

## 2021-08-01 DIAGNOSIS — Z79899 Other long term (current) drug therapy: Secondary | ICD-10-CM | POA: Diagnosis not present

## 2021-08-01 DIAGNOSIS — M069 Rheumatoid arthritis, unspecified: Secondary | ICD-10-CM | POA: Diagnosis not present

## 2021-08-15 ENCOUNTER — Other Ambulatory Visit: Payer: Self-pay | Admitting: Thoracic Surgery (Cardiothoracic Vascular Surgery)

## 2021-08-15 DIAGNOSIS — C3492 Malignant neoplasm of unspecified part of left bronchus or lung: Secondary | ICD-10-CM

## 2021-08-16 ENCOUNTER — Telehealth: Payer: Self-pay

## 2021-08-16 NOTE — Telephone Encounter (Signed)
Pt called wanting to know if it is okay for her RHEUM can give her Rituximab. ?

## 2021-08-17 ENCOUNTER — Ambulatory Visit (INDEPENDENT_AMBULATORY_CARE_PROVIDER_SITE_OTHER): Payer: Self-pay | Admitting: Thoracic Surgery (Cardiothoracic Vascular Surgery)

## 2021-08-17 ENCOUNTER — Ambulatory Visit
Admission: RE | Admit: 2021-08-17 | Discharge: 2021-08-17 | Disposition: A | Discharge status: Home / Self Care | Payer: MEDICARE | Source: Ambulatory Visit | Attending: Thoracic Surgery (Cardiothoracic Vascular Surgery) | Admitting: Thoracic Surgery (Cardiothoracic Vascular Surgery)

## 2021-08-17 VITALS — BP 94/47 | HR 82 | Resp 20 | Ht 61.0 in | Wt 102.0 lb

## 2021-08-17 DIAGNOSIS — Z09 Encounter for follow-up examination after completed treatment for conditions other than malignant neoplasm: Secondary | ICD-10-CM

## 2021-08-17 DIAGNOSIS — C3492 Malignant neoplasm of unspecified part of left bronchus or lung: Secondary | ICD-10-CM

## 2021-08-17 DIAGNOSIS — J90 Pleural effusion, not elsewhere classified: Secondary | ICD-10-CM | POA: Diagnosis not present

## 2021-08-17 NOTE — Progress Notes (Signed)
?   ?  [address] ?      [place] [address] ?            [phone]      ? ?Kendra Taylor comes in for 1 month follow-up appointment.  She underwent a robotic assisted left lower lobectomy for non-small cell lung cancer.  She had a residual left-sided effusion which was tapped by Dr. Valeta Harms.  Chest x-ray today shows reaccumulation of the fluid and it appears essentially the same.  She denies any shortness of breath or pleuritic pain.  On review of her op report she did have some adhesions which likely has entrapped her lung and is preventing full expansion.  Given her lack of symptoms I do not think there is any need for any further intervention for the small pleural effusion on the left side.  She will follow-up as needed. ?

## 2021-08-20 ENCOUNTER — Encounter (HOSPITAL_COMMUNITY): Payer: Self-pay | Admitting: Emergency Medicine

## 2021-08-20 ENCOUNTER — Ambulatory Visit (HOSPITAL_COMMUNITY)
Admission: EM | Admit: 2021-08-20 | Discharge: 2021-08-20 | Disposition: A | Discharge status: Home / Self Care | Payer: MEDICARE | Attending: Emergency Medicine | Admitting: Emergency Medicine

## 2021-08-20 DIAGNOSIS — S61214A Laceration without foreign body of right ring finger without damage to nail, initial encounter: Secondary | ICD-10-CM | POA: Diagnosis not present

## 2021-08-20 MED ORDER — TETANUS-DIPHTH-ACELL PERTUSSIS 5-2.5-18.5 LF-MCG/0.5 IM SUSY
0.5000 mL | PREFILLED_SYRINGE | Freq: Once | INTRAMUSCULAR | Status: AC
  Administered 2021-08-20: 0.5 mL via INTRAMUSCULAR

## 2021-08-20 MED ORDER — TETANUS-DIPHTH-ACELL PERTUSSIS 5-2.5-18.5 LF-MCG/0.5 IM SUSY
PREFILLED_SYRINGE | INTRAMUSCULAR | Status: AC
  Filled 2021-08-20: qty 0.5

## 2021-08-20 MED ORDER — TETANUS-DIPHTHERIA TOXOIDS TD 5-2 LFU IM INJ: 0.5000 mL | INJECTION | Freq: Once | INTRAMUSCULAR | Status: DC

## 2021-08-20 NOTE — ED Provider Notes (Addendum)
[facility]    CSN: [id number] Arrival date & time: 08/20/21  1542      History   Chief Complaint Chief Complaint  Patient presents with   Laceration    HPI Kendra Taylor is a 76 y.o. female.   Patient presents with laceration to the right middle finger occurring last night, endorses that she sliced finger on a piece of metal.  Bleeding persisting.  Range of motion intact.  Denies numbness or tingling.  Has not attempted treatment of symptoms.  Taking daily aspirin.   Past Medical History:  Diagnosis Date   Anemia    Anxiety    Arthritis    Atypical mole 01/08/2013   right inner forearm   Basal cell carcinoma 03/12/2017   right concha exc mohs   Depression    Dyspnea    GERD (gastroesophageal reflux disease)    HA (headache)    Migraine    Sleep apnea    cpap    Patient Active Problem List   Diagnosis Date Noted   Allergic rhinitis 07/11/2021   Breast tenderness 07/11/2021   Elevated blood-pressure reading without diagnosis of hypertension 07/11/2021   Female stress incontinence 07/11/2021   Hearing loss 07/11/2021   Iron deficiency anemia 07/11/2021   Leukocytosis 07/11/2021   Lung field abnormal 07/11/2021   Malignant neoplasm of unspecified part of left bronchus or lung (HCC) 07/11/2021   Migraine without status migrainosus, not intractable 07/11/2021   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   Personal history of transient ischemic attack (TIA), and cerebral infarction without residual deficits 07/11/2021   Weight decreased 07/11/2021   Adenocarcinoma of left lung, 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   S/P Robotic Asissted Video Assisted Thoracoscopy with Left Lower Lobectomy 05/24/2021   Vision changes 01/08/2021   Chronic migraine w/o aura w/o status migrainosus, not intractable 01/08/2021   Stroke-like symptoms 12/26/2020   Esophageal reflux 12/26/2020   Anxiety 12/26/2020   Obstructive sleep apnea 12/26/2020   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   Precordial pain    Nasal congestion 05/20/2017   Solitary pulmonary nodule 12/18/2016   LPRD (laryngopharyngeal reflux disease) 02/26/2016   Cough 08/23/2015   Upper airway cough syndrome with classic voice fatigue >> doe  08/23/2015   Bronchiectasis without acute exacerbation (HCC) 08/23/2015   HA (headache)    Rheumatoid arthritis (HCC) 12/27/2013    Past Surgical History:  Procedure Laterality Date   ABDOMINAL HYSTERECTOMY     BRONCHIAL BIOPSY  05/24/2021   Procedure: BRONCHIAL BIOPSIES;  Surgeon: Josephine Igo, DO;  [facility];  Service: Pulmonary;;   BRONCHIAL BRUSHINGS  05/24/2021   Procedure: BRONCHIAL BRUSHINGS;  Surgeon: Josephine Igo, DO;  [facility];  Service: Pulmonary;;   BRONCHIAL NEEDLE ASPIRATION BIOPSY  05/24/2021   Procedure: BRONCHIAL NEEDLE ASPIRATION BIOPSIES;  Surgeon: Josephine Igo, DO;  [facility];  Service: Pulmonary;;   BUNIONECTOMY     both   FIDUCIAL MARKER PLACEMENT  05/24/2021   Procedure: FIDUCIAL DYE MARKING;  Surgeon: Josephine Igo, DO;  [facility];  Service: Pulmonary;;   FINGER ARTHROPLASTY Right 07/30/2012   Procedure: RIGHT INDEX FINGER, MIDDLE FINGER, RING FINGER, SMALL FINGER MCP ARTHROPLASTY WITH REALIGNMENT/REPAIR OF EXTENSOR TENDON;  Surgeon: Dominica Severin, MD;  [facility];  Service: Orthopedics;  Laterality: Right;   FINGER SURGERY     HAMMER TOE SURGERY Left 03/03/2014   Procedure: SECOND THROUGH FIFTH HAMMERTOE CORRECTION;  Surgeon: Toni Arthurs, MD;  Location: [facility];  Service: Orthopedics;  Laterality: Left;   INTERCOSTAL NERVE BLOCK Left 05/24/2021   Procedure: INTERCOSTAL NERVE BLOCK;  Surgeon: Corliss Skains, MD;  [facility];  Service: Thoracic;  Laterality: Left;   JOINT REPLACEMENT     LYMPH NODE DISSECTION Left 05/24/2021   Procedure: LYMPH NODE DISSECTION;  Surgeon: Corliss Skains, MD;  [facility];  Service: Thoracic;  Laterality: Left;   METATARSAL HEAD EXCISION Left 03/03/2014   Procedure: LEFT SECOND THROUGH FIFTH METATARSAL HEAD EXCISION;  Surgeon: Toni Arthurs, MD;  Location: [facility];  Service: Orthopedics;  Laterality: Left;   OOPHORECTOMY     VIDEO BRONCHOSCOPY Bilateral 12/18/2016   Procedure: VIDEO BRONCHOSCOPY WITH FLUORO;  Surgeon: Nyoka Cowden, MD;  Location: [facility];  Service: Cardiopulmonary;  Laterality: Bilateral;   VIDEO BRONCHOSCOPY WITH ENDOBRONCHIAL NAVIGATION Left 02/26/2017   Procedure: VIDEO BRONCHOSCOPY WITH ENDOBRONCHIAL NAVIGATION;  Surgeon: Leslye Peer, MD;  [facility];  Service: Thoracic;  Laterality: Left;   VIDEO BRONCHOSCOPY WITH RADIAL ENDOBRONCHIAL ULTRASOUND  05/24/2021   Procedure: VIDEO BRONCHOSCOPY WITH RADIAL ENDOBRONCHIAL ULTRASOUND;  Surgeon: Josephine Igo, DO;  [facility];  Service: Pulmonary;;    OB History   No obstetric history on file.      Home Medications    Prior to Admission medications   Medication Sig Start Date End Date Taking? Authorizing Provider  acetaminophen (TYLENOL) 500 MG tablet Take 500 mg by mouth every 6 (six) hours as needed.    [provider]  alendronate (FOSAMAX) 70 MG tablet Take 70 mg by mouth once a week. 09/23/17   [provider]  aspirin EC 81 MG tablet Take 81 mg by mouth daily. Swallow whole.    [provider]  atorvastatin (LIPITOR) 80 MG tablet Take 1 tablet (80 mg total) by mouth daily. 12/28/20   Leatha Gilding, MD  cholecalciferol (VITAMIN D3) 25 MCG (1000 UNIT) tablet Take 1,000 Units by mouth daily.    [provider]  Cyanocobalamin (B-12 PO) Take 1 tablet by mouth daily.    [provider]  fluticasone (FLONASE) 50 MCG/ACT nasal spray Place 1 spray into both nostrils daily as needed for allergies. 08/28/20   [provider]  folic acid (FOLVITE) 1 MG tablet Take 1 mg by mouth daily.     [provider]  Iron, Ferrous Sulfate, 325 (65 Fe) MG TABS 1 tablet 06/05/21   [provider]  levocetirizine (XYZAL) 5 MG tablet Take 5 mg by mouth at bedtime as needed for allergies.    [provider]  Magnesium 400 MG CAPS Take 400 mg by mouth every other day.    [provider]  methotrexate (RHEUMATREX) 2.5 MG tablet Take 15 mg by mouth every Thursday. 05/16/17   [provider]  metoprolol tartrate (LOPRESSOR) 25 MG tablet Take 1 tablet (25 mg total) by mouth 2 (two) times daily. 05/30/21   Barrett, Erin R, PA-C  montelukast (SINGULAIR) 10 MG tablet Take 10 mg by mouth daily. 06/17/21   [provider]  olopatadine (PATANOL) 0.1 % ophthalmic solution Place 1 drop into both eyes daily as needed for allergies.    [provider]  ondansetron (ZOFRAN ODT) 4 MG disintegrating tablet Take 1 tablet (4 mg total) by mouth every 8 (eight) hours as needed for nausea or vomiting. 01/08/21   Levert Feinstein, MD  pantoprazole (PROTONIX) 40 MG tablet Take 40 mg by  mouth every morning. 12/21/20   [provider]  Polyethyl Glycol-Propyl Glycol (SYSTANE OP) Place 1 drop into both eyes as needed (for dry eyes).    [provider]  predniSONE (DELTASONE) 5 MG tablet Take 5 mg by mouth 2 (two) times daily with a meal.    [provider]  Rimegepant Sulfate (NURTEC) 75 MG TBDP Take 1 tab at onset of migraine.  May repeat in 2 hrs, if needed.  Max dose: 2 tabs/day. This is a 30 day prescription. 01/08/21   Levert Feinstein, MD  triamcinolone cream (KENALOG) 0.1 % Apply 1 application topically as needed (for skin irritation).    [provider]    Family History Family History  Problem Relation Age of Onset   Dementia Mother    Sleep apnea Mother    Hyperthyroidism Mother    Dementia Father    Sleep apnea Father     Social History Social History   Tobacco Use   Smoking status: Never   Smokeless tobacco: Never  Substance  Use Topics   Alcohol use: No    Alcohol/week: 0.0 standard drinks   Drug use: No     Allergies   Aspirin, Ubrogepant, and Venlafaxine   Review of Systems Review of Systems  Constitutional: Negative.   Respiratory: Negative.    Cardiovascular: Negative.   Skin:  Positive for wound. Negative for color change, pallor and rash.  Neurological: Negative.     Physical Exam Triage Vital Signs ED Triage Vitals  Enc Vitals Group     BP 08/20/21 1646 (!) 143/83     Pulse Rate 08/20/21 1646 82     Resp 08/20/21 1646 17     Temp 08/20/21 1646 98.3 F (36.8 C)     Temp src --      SpO2 08/20/21 1646 97 %     Weight --      Height --      Head Circumference --      Peak Flow --      Pain Score 08/20/21 1643 2     Pain Loc --      Pain Edu? --      Excl. in GC? --    No data found.  Updated Vital Signs BP (!) 143/83   Pulse 82   Temp 98.3 F (36.8 C)   Resp 17   SpO2 97%   Visual Acuity Right Eye Distance:   Left Eye Distance:   Bilateral Distance:    Right Eye Near:   Left Eye Near:    Bilateral Near:     Physical Exam Constitutional:      Appearance: Normal appearance.  Eyes:     Extraocular Movements: Extraocular movements intact.  Musculoskeletal:     Comments: 1 Centimeter laceration to the lateral aspect of the nailbed on the right ring finger, no involvement of the nail, mild swelling of the distal proximal phalanx without joint involvement, sensation intact, capillary refill less than 3  Neurological:     Mental Status: She is alert and oriented to person, place, and time. Mental status is at baseline.  Psychiatric:        Mood and Affect: Mood normal.        Behavior: Behavior normal.     UC Treatments / Results  Labs (all labs ordered are listed, but only abnormal results are displayed) Labs Reviewed - No data to display  EKG   Radiology No results found.  Procedures Procedures (  including critical care time)  Medications Ordered in  UC Medications  Tdap (BOOSTRIX) injection 0.5 mL (has no administration in time range)    Initial Impression / Assessment and Plan / UC Course  I have reviewed the triage vital signs and the nursing notes.  Pertinent labs & imaging results that were available during my care of the patient were reviewed by me and considered in my medical decision making (see chart for details).  Laceration of the right ring finger without foreign body without damage to the nail, initial encounter  Able to close laceration with skin glue, compression wrap placed on finger to be left in place for 24 hours to help resolve bleeding, tetanus injection given, once removed, patient may clean daily with normal hygiene, using diluted soapy water, patting dry and cover with nonadhesive bandage if at risk for contamination, may use ice and Tylenol for management of discomfort, advised to watch for signs of infections and return to urgent care for reevaluation if symptoms occur Final Clinical Impressions(s) / UC Diagnoses   Final diagnoses:  Laceration of right ring finger without foreign body without damage to nail, initial encounter     Discharge Instructions      Today we attempted to close your laceration with skin glue, a compression wrap has been applied to your finger to help it stop bleeding, please leave in place for 24 hours  After 24 hours you may review bandage and check to see if area has begun to scab over, skin glue will naturally fall off with time  Once bandages removed you may cleanse daily with your normal hygiene, using diluted soapy water rinse over the area, pat dry and cover with a nonadherent Band-Aid if at risk for contamination  You may place ice over your bandage into the 15-minute intervals to help with pain and swelling  You may use Tylenol as needed for additional comfort  You have been given a tetanus injection which is good for 10 years  If you have any trouble taking your  bandage off you may return to the urgent care as needed  Please watch area for signs of infection such as increased swelling, increased pain, puslike drainage, fever or chills, at any point if this occurs please return for reevaluation and need for antibiotics   ED Prescriptions   None    PDMP not reviewed this encounter.   Valinda Hoar, NP 08/20/21 1719    Valinda Hoar, NP 08/20/21 1719

## 2021-08-20 NOTE — ED Triage Notes (Addendum)
Pt is present today with a laceration on her right ring finger. Pt states that she cut her finger last night on a sheet of metal.  ?

## 2021-08-20 NOTE — Discharge Instructions (Signed)
Today we attempted to close your laceration with skin glue, a compression wrap has been applied to your finger to help it stop bleeding, please leave in place for 24 hours ? ?After 24 hours you may review bandage and check to see if area has begun to scab over, skin glue will naturally fall off with time ? ?Once bandages removed you may cleanse daily with your normal hygiene, using diluted soapy water rinse over the area, pat dry and cover with a nonadherent Band-Aid if at risk for contamination ? ?You may place ice over your bandage into the 15-minute intervals to help with pain and swelling ? ?You may use Tylenol as needed for additional comfort ? ?You have been given a tetanus injection which is good for 10 years ? ?If you have any trouble taking your bandage off you may return to the urgent care as needed ? ?Please watch area for signs of infection such as increased swelling, increased pain, puslike drainage, fever or chills, at any point if this occurs please return for reevaluation and need for antibiotics ?

## 2021-08-23 NOTE — Telephone Encounter (Signed)
Pt was advised per Dr. Julien Nordmann, it is okay for her RHEUM to give her Rituximab. ?

## 2021-09-10 DIAGNOSIS — Z1212 Encounter for screening for malignant neoplasm of rectum: Secondary | ICD-10-CM | POA: Diagnosis not present

## 2021-09-10 DIAGNOSIS — K219 Gastro-esophageal reflux disease without esophagitis: Secondary | ICD-10-CM | POA: Diagnosis not present

## 2021-09-10 DIAGNOSIS — D649 Anemia, unspecified: Secondary | ICD-10-CM | POA: Diagnosis not present

## 2021-09-10 DIAGNOSIS — C3492 Malignant neoplasm of unspecified part of left bronchus or lung: Secondary | ICD-10-CM | POA: Diagnosis not present

## 2021-09-10 DIAGNOSIS — Z0000 Encounter for general adult medical examination without abnormal findings: Secondary | ICD-10-CM | POA: Diagnosis not present

## 2021-09-10 DIAGNOSIS — Z8673 Personal history of transient ischemic attack (TIA), and cerebral infarction without residual deficits: Secondary | ICD-10-CM | POA: Diagnosis not present

## 2021-09-18 DIAGNOSIS — M069 Rheumatoid arthritis, unspecified: Secondary | ICD-10-CM | POA: Diagnosis not present

## 2021-09-18 DIAGNOSIS — M8580 Other specified disorders of bone density and structure, unspecified site: Secondary | ICD-10-CM | POA: Diagnosis not present

## 2021-09-18 DIAGNOSIS — K219 Gastro-esophageal reflux disease without esophagitis: Secondary | ICD-10-CM | POA: Diagnosis not present

## 2021-09-18 DIAGNOSIS — M1990 Unspecified osteoarthritis, unspecified site: Secondary | ICD-10-CM | POA: Diagnosis not present

## 2021-09-18 DIAGNOSIS — Z79899 Other long term (current) drug therapy: Secondary | ICD-10-CM | POA: Diagnosis not present

## 2021-09-26 DIAGNOSIS — M1990 Unspecified osteoarthritis, unspecified site: Secondary | ICD-10-CM | POA: Diagnosis not present

## 2021-09-26 DIAGNOSIS — M0589 Other rheumatoid arthritis with rheumatoid factor of multiple sites: Secondary | ICD-10-CM | POA: Diagnosis not present

## 2021-09-26 DIAGNOSIS — M069 Rheumatoid arthritis, unspecified: Secondary | ICD-10-CM | POA: Diagnosis not present

## 2021-09-26 DIAGNOSIS — K219 Gastro-esophageal reflux disease without esophagitis: Secondary | ICD-10-CM | POA: Diagnosis not present

## 2021-09-27 DIAGNOSIS — M0589 Other rheumatoid arthritis with rheumatoid factor of multiple sites: Secondary | ICD-10-CM | POA: Diagnosis not present

## 2021-09-27 DIAGNOSIS — Z1159 Encounter for screening for other viral diseases: Secondary | ICD-10-CM | POA: Diagnosis not present

## 2021-10-15 ENCOUNTER — Telehealth: Payer: Self-pay | Admitting: Internal Medicine

## 2021-10-15 DIAGNOSIS — C3492 Malignant neoplasm of unspecified part of left bronchus or lung: Secondary | ICD-10-CM | POA: Diagnosis not present

## 2021-10-15 DIAGNOSIS — D509 Iron deficiency anemia, unspecified: Secondary | ICD-10-CM | POA: Diagnosis not present

## 2021-10-15 DIAGNOSIS — M0589 Other rheumatoid arthritis with rheumatoid factor of multiple sites: Secondary | ICD-10-CM | POA: Diagnosis not present

## 2021-10-15 DIAGNOSIS — R195 Other fecal abnormalities: Secondary | ICD-10-CM | POA: Diagnosis not present

## 2021-10-15 NOTE — Telephone Encounter (Signed)
Called patient regarding upcoming August appointments, patient is notified. 

## 2021-10-22 ENCOUNTER — Telehealth: Payer: Self-pay | Admitting: Internal Medicine

## 2021-10-22 NOTE — Telephone Encounter (Signed)
Called patient regarding upcoming August appointment, patient has been called and voicemail was left.

## 2021-10-23 DIAGNOSIS — D509 Iron deficiency anemia, unspecified: Secondary | ICD-10-CM | POA: Diagnosis not present

## 2021-10-23 DIAGNOSIS — K2970 Gastritis, unspecified, without bleeding: Secondary | ICD-10-CM | POA: Diagnosis not present

## 2021-10-23 DIAGNOSIS — K317 Polyp of stomach and duodenum: Secondary | ICD-10-CM | POA: Diagnosis not present

## 2021-10-23 DIAGNOSIS — D132 Benign neoplasm of duodenum: Secondary | ICD-10-CM | POA: Diagnosis not present

## 2021-10-24 DIAGNOSIS — M8580 Other specified disorders of bone density and structure, unspecified site: Secondary | ICD-10-CM | POA: Diagnosis not present

## 2021-10-24 DIAGNOSIS — M1990 Unspecified osteoarthritis, unspecified site: Secondary | ICD-10-CM | POA: Diagnosis not present

## 2021-10-24 DIAGNOSIS — M069 Rheumatoid arthritis, unspecified: Secondary | ICD-10-CM | POA: Diagnosis not present

## 2021-10-24 DIAGNOSIS — Z79899 Other long term (current) drug therapy: Secondary | ICD-10-CM | POA: Diagnosis not present

## 2021-10-24 DIAGNOSIS — K219 Gastro-esophageal reflux disease without esophagitis: Secondary | ICD-10-CM | POA: Diagnosis not present

## 2021-11-14 DIAGNOSIS — D132 Benign neoplasm of duodenum: Secondary | ICD-10-CM | POA: Diagnosis not present

## 2021-11-15 DIAGNOSIS — M1990 Unspecified osteoarthritis, unspecified site: Secondary | ICD-10-CM | POA: Diagnosis not present

## 2021-11-15 DIAGNOSIS — K219 Gastro-esophageal reflux disease without esophagitis: Secondary | ICD-10-CM | POA: Diagnosis not present

## 2021-11-15 DIAGNOSIS — Z79899 Other long term (current) drug therapy: Secondary | ICD-10-CM | POA: Diagnosis not present

## 2021-11-15 DIAGNOSIS — M069 Rheumatoid arthritis, unspecified: Secondary | ICD-10-CM | POA: Diagnosis not present

## 2021-11-15 DIAGNOSIS — M8580 Other specified disorders of bone density and structure, unspecified site: Secondary | ICD-10-CM | POA: Diagnosis not present

## 2021-11-15 DIAGNOSIS — M25552 Pain in left hip: Secondary | ICD-10-CM | POA: Diagnosis not present

## 2021-11-15 DIAGNOSIS — M25551 Pain in right hip: Secondary | ICD-10-CM | POA: Diagnosis not present

## 2021-12-12 DIAGNOSIS — H31003 Unspecified chorioretinal scars, bilateral: Secondary | ICD-10-CM | POA: Diagnosis not present

## 2021-12-12 DIAGNOSIS — H5203 Hypermetropia, bilateral: Secondary | ICD-10-CM | POA: Diagnosis not present

## 2021-12-12 DIAGNOSIS — D3131 Benign neoplasm of right choroid: Secondary | ICD-10-CM | POA: Diagnosis not present

## 2021-12-12 DIAGNOSIS — H52203 Unspecified astigmatism, bilateral: Secondary | ICD-10-CM | POA: Diagnosis not present

## 2021-12-24 ENCOUNTER — Other Ambulatory Visit: Payer: Self-pay

## 2021-12-24 ENCOUNTER — Inpatient Hospital Stay: Payer: MEDICARE | Attending: Internal Medicine

## 2021-12-24 ENCOUNTER — Ambulatory Visit (HOSPITAL_COMMUNITY)
Admission: RE | Admit: 2021-12-24 | Discharge: 2021-12-24 | Disposition: A | Discharge status: Home / Self Care | Payer: MEDICARE | Source: Ambulatory Visit | Attending: Internal Medicine | Admitting: Internal Medicine

## 2021-12-24 DIAGNOSIS — C3432 Malignant neoplasm of lower lobe, left bronchus or lung: Secondary | ICD-10-CM | POA: Diagnosis not present

## 2021-12-24 DIAGNOSIS — J90 Pleural effusion, not elsewhere classified: Secondary | ICD-10-CM | POA: Diagnosis not present

## 2021-12-24 DIAGNOSIS — C3490 Malignant neoplasm of unspecified part of unspecified bronchus or lung: Secondary | ICD-10-CM | POA: Insufficient documentation

## 2021-12-24 LAB — CMP (CANCER CENTER ONLY)
ALT: 12 U/L (ref 0–44)
AST: 19 U/L (ref 15–41)
Albumin: 3.7 g/dL (ref 3.5–5.0)
Alkaline Phosphatase: 65 U/L (ref 38–126)
Anion gap: 3 — ABNORMAL LOW (ref 5–15)
BUN: 15 mg/dL (ref 8–23)
CO2: 32 mmol/L (ref 22–32)
Calcium: 9.2 mg/dL (ref 8.9–10.3)
Chloride: 102 mmol/L (ref 98–111)
Creatinine: 0.48 mg/dL (ref 0.44–1.00)
GFR, Estimated: >60 mL/min (ref >60)
Glucose, Bld: 79 mg/dL (ref 70–99)
Potassium: 3.8 mmol/L (ref 3.5–5.1)
Sodium: 137 mmol/L (ref 135–145)
Total Bilirubin: 0.8 mg/dL (ref 0.3–1.2)
Total Protein: 5.9 g/dL — ABNORMAL LOW (ref 6.5–8.1)

## 2021-12-24 LAB — CBC WITH DIFFERENTIAL (CANCER CENTER ONLY)
Abs Immature Granulocytes: 0.09 10*3/uL — ABNORMAL HIGH (ref 0.00–0.07)
Basophils Absolute: 0 10*3/uL (ref 0.0–0.1)
Basophils Relative: 0 %
Eosinophils Absolute: 0.2 10*3/uL (ref 0.0–0.5)
Eosinophils Relative: 2 %
HCT: 33.7 % — ABNORMAL LOW (ref 36.0–46.0)
Hemoglobin: 10.9 g/dL — ABNORMAL LOW (ref 12.0–15.0)
Immature Granulocytes: 1 %
Lymphocytes Relative: 10 %
Lymphs Abs: 1 10*3/uL (ref 0.7–4.0)
MCH: 27.3 pg (ref 26.0–34.0)
MCHC: 32.3 g/dL (ref 30.0–36.0)
MCV: 84.3 fL (ref 80.0–100.0)
Monocytes Absolute: 0.9 10*3/uL (ref 0.1–1.0)
Monocytes Relative: 9 %
Neutro Abs: 7.9 10*3/uL — ABNORMAL HIGH (ref 1.7–7.7)
Neutrophils Relative %: 78 %
Platelet Count: 247 10*3/uL (ref 150–400)
RBC: 4 MIL/uL (ref 3.87–5.11)
RDW: 14.8 % (ref 11.5–15.5)
WBC Count: 10.1 10*3/uL (ref 4.0–10.5)
nRBC: 0 % (ref 0.0–0.2)

## 2021-12-24 MED ORDER — IOHEXOL 300 MG/ML  SOLN
75.0000 mL | Freq: Once | INTRAMUSCULAR | Status: AC | PRN
  Administered 2021-12-24: 75 mL via INTRAVENOUS

## 2021-12-24 MED ORDER — SODIUM CHLORIDE (PF) 0.9 % IJ SOLN
INTRAMUSCULAR | Status: AC
  Filled 2021-12-24: qty 50

## 2021-12-25 DIAGNOSIS — M25551 Pain in right hip: Secondary | ICD-10-CM | POA: Diagnosis not present

## 2021-12-25 DIAGNOSIS — M7061 Trochanteric bursitis, right hip: Secondary | ICD-10-CM | POA: Diagnosis not present

## 2021-12-27 ENCOUNTER — Other Ambulatory Visit: Payer: Self-pay

## 2021-12-27 ENCOUNTER — Inpatient Hospital Stay (HOSPITAL_BASED_OUTPATIENT_CLINIC_OR_DEPARTMENT_OTHER): Payer: MEDICARE | Admitting: Internal Medicine

## 2021-12-27 ENCOUNTER — Encounter: Payer: Self-pay | Admitting: Internal Medicine

## 2021-12-27 VITALS — BP 149/82 | HR 84 | Temp 98.5°F | Resp 15 | Wt 104.3 lb

## 2021-12-27 DIAGNOSIS — C3490 Malignant neoplasm of unspecified part of unspecified bronchus or lung: Secondary | ICD-10-CM

## 2021-12-27 DIAGNOSIS — C3432 Malignant neoplasm of lower lobe, left bronchus or lung: Secondary | ICD-10-CM | POA: Diagnosis not present

## 2021-12-27 NOTE — Progress Notes (Signed)
[place] [phone]   Fax:([phone]) [place], [place] [address] [place] [address]  DIAGNOSIS:  Stage IA (T1c, N0, M0) non-small cell lung cancer, adenocarcinoma presented with left lower lobe lung mass   PRIOR THERAPY: Status post left lower lobectomy with lymph node sampling on May 24, 2021 under the care of Dr. Kipp Brood.  CURRENT THERAPY: Observation  INTERVAL HISTORY: Kendra Taylor 76 y.o. female returns to the clinic today for 6 months follow-up visit accompanied by a friend.  The patient is feeling fine today with no concerning complaints.  She had upper endoscopy recently by Dr. 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  The patient denied having any current chest pain, shortness of breath, cough or hemoptysis.  She has no nausea, vomiting, diarrhea or constipation.  She has no headache or visual changes.  She has no recent weight loss or night sweats.  She is here today for evaluation with repeat CT scan of the chest for restaging of her disease.  MEDICAL HISTORY: Past Medical History:  Diagnosis Date   Anemia    Anxiety    Arthritis    Atypical mole 01/08/2013   right inner forearm   Basal cell carcinoma 03/12/2017   right concha exc mohs   Depression    Dyspnea    GERD (gastroesophageal reflux disease)    HA (headache)    Migraine    Sleep apnea    cpap    ALLERGIES:  is allergic to aspirin, ubrogepant, and venlafaxine.  MEDICATIONS:  Current Outpatient Medications  Medication Sig Dispense Refill   acetaminophen (TYLENOL) 500 MG tablet Take 500 mg by mouth every 6 (six) hours as needed.     atorvastatin (LIPITOR) 80 MG tablet Take 1 tablet (80 mg total) by mouth daily. 30 tablet 1   cholecalciferol (VITAMIN D3) 25 MCG (1000 UNIT) tablet Take 1,000 Units by mouth daily.      Cyanocobalamin (B-12 PO) Take 1 tablet by mouth daily.     fluticasone (FLONASE) 50 MCG/ACT nasal spray Place 1 spray into both nostrils daily as needed for allergies.     folic acid (FOLVITE) 1 MG tablet Take 1 mg by mouth daily.     Iron, Ferrous Sulfate, 325 (65 Fe) MG TABS 1 tablet     levocetirizine (XYZAL) 5 MG tablet Take 5 mg by mouth at bedtime as needed for allergies.     Magnesium 400 MG CAPS Take 400 mg by mouth every other day.     methotrexate (RHEUMATREX) 2.5 MG tablet Take 15 mg by mouth every Thursday.     olopatadine (PATANOL) 0.1 % ophthalmic solution Place 1 drop into both eyes daily as needed for allergies.     ondansetron (ZOFRAN ODT) 4 MG disintegrating tablet Take 1 tablet (4 mg total) by mouth every 8 (eight) hours as needed for nausea or vomiting. 20 tablet 6   Polyethyl Glycol-Propyl Glycol (SYSTANE OP) Place 1 drop into both eyes as needed (for dry eyes).     predniSONE (DELTASONE) 5 MG tablet Take 5 mg by mouth 2 (two) times daily with a meal.     triamcinolone cream (KENALOG) 0.1 % Apply 1 application topically as needed (for skin irritation).     Rimegepant Sulfate (NURTEC) 75 MG TBDP Take 1 tab at onset of migraine.  May repeat  in 2 hrs, if needed.  Max dose: 2 tabs/day. This is a 30 day prescription. (Patient not taking: Reported on 12/27/2021) 12 tablet 11   No current facility-administered medications for this visit.    SURGICAL HISTORY:  Past Surgical History:  Procedure Laterality Date   ABDOMINAL HYSTERECTOMY     BRONCHIAL BIOPSY  05/24/2021   Procedure: BRONCHIAL BIOPSIES;  Surgeon: [name], DO;  Location: [place];  Service: Pulmonary;;   BRONCHIAL BRUSHINGS  05/24/2021   Procedure: BRONCHIAL BRUSHINGS;  Surgeon: [name], DO;  Location: [employer];  Service: Pulmonary;;   BRONCHIAL NEEDLE ASPIRATION BIOPSY  05/24/2021   Procedure: BRONCHIAL NEEDLE ASPIRATION BIOPSIES;  Surgeon: [name], DO;  Location: [place];   Service: Pulmonary;;   BUNIONECTOMY     both   FIDUCIAL MARKER PLACEMENT  05/24/2021   Procedure: FIDUCIAL DYE MARKING;  Surgeon: [name], DO;  Location: [place];  Service: Pulmonary;;   FINGER ARTHROPLASTY Right 07/30/2012   Procedure: RIGHT INDEX FINGER, MIDDLE FINGER, RING FINGER, SMALL FINGER MCP ARTHROPLASTY WITH REALIGNMENT/REPAIR OF EXTENSOR TENDON;  Surgeon: Roseanne Kaufman, MD;  Location: [place];  Service: Orthopedics;  Laterality: Right;   FINGER SURGERY     HAMMER TOE SURGERY Left 03/03/2014   Procedure: SECOND THROUGH FIFTH HAMMERTOE CORRECTION;  Surgeon: Wylene Simmer, MD;  Location: [place];  Service: Orthopedics;  Laterality: Left;   INTERCOSTAL NERVE BLOCK Left 05/24/2021   Procedure: INTERCOSTAL NERVE BLOCK;  Surgeon: Lajuana Matte, MD;  Location: [place];  Service: Thoracic;  Laterality: Left;   JOINT REPLACEMENT     LYMPH NODE DISSECTION Left 05/24/2021   Procedure: LYMPH NODE DISSECTION;  Surgeon: Lajuana Matte, MD;  Location: [place];  Service: Thoracic;  Laterality: Left;   METATARSAL HEAD EXCISION Left 03/03/2014   Procedure: LEFT SECOND THROUGH FIFTH METATARSAL HEAD EXCISION;  Surgeon: Wylene Simmer, MD;  Location: [place];  Service: Orthopedics;  Laterality: Left;   OOPHORECTOMY     VIDEO BRONCHOSCOPY Bilateral 12/18/2016   Procedure: VIDEO BRONCHOSCOPY WITH FLUORO;  Surgeon: Tanda Rockers, MD;  [facility];  Service: Cardiopulmonary;  Laterality: Bilateral;   VIDEO BRONCHOSCOPY WITH ENDOBRONCHIAL NAVIGATION Left 02/26/2017   Procedure: VIDEO BRONCHOSCOPY WITH ENDOBRONCHIAL NAVIGATION;  Surgeon: Collene Gobble, MD;  Location: [place];  Service: Thoracic;  Laterality: Left;   VIDEO BRONCHOSCOPY WITH RADIAL ENDOBRONCHIAL ULTRASOUND  05/24/2021   Procedure: VIDEO BRONCHOSCOPY WITH RADIAL ENDOBRONCHIAL ULTRASOUND;  Surgeon: [name], DO;  Location: [facility];  Service: Pulmonary;;    REVIEW OF SYSTEMS:   A comprehensive review of systems was negative.   PHYSICAL EXAMINATION: General appearance: alert, cooperative, and no distress Head: Normocephalic, without obvious abnormality, atraumatic Neck: no adenopathy, no JVD, supple, symmetrical, trachea midline, and thyroid not enlarged, symmetric, no tenderness/mass/nodules Lymph nodes: Cervical, supraclavicular, and axillary nodes normal. Resp: clear to auscultation bilaterally Back: symmetric, no curvature. ROM normal. No CVA tenderness. Cardio: regular rate and rhythm, S1, S2 normal, no murmur, click, rub or gallop GI: soft, non-tender; bowel sounds normal; no masses,  no organomegaly Extremities: extremities normal, atraumatic, no cyanosis or edema  ECOG PERFORMANCE STATUS: 0 - Asymptomatic  Blood pressure (!) 149/82, pulse 84, temperature 98.5 F (36.9 C), temperature source Oral, resp. rate 15, weight 104 lb 4.8 oz (47.3 kg), SpO2 98 %.  LABORATORY DATA: Lab Results  Component Value Date   WBC 10.1 12/24/2021   HGB 10.9 (L) 12/24/2021   HCT  33.7 (L) 12/24/2021   MCV 84.3 12/24/2021   PLT 247 12/24/2021      Chemistry      Component Value Date/Time   NA 137 12/24/2021 1112   NA 141 08/29/2020 1511   K 3.8 12/24/2021 1112   CL 102 12/24/2021 1112   CO2 32 12/24/2021 1112   BUN 15 12/24/2021 1112   BUN 24 08/29/2020 1511   CREATININE 0.48 12/24/2021 1112      Component Value Date/Time   CALCIUM 9.2 12/24/2021 1112   ALKPHOS 65 12/24/2021 1112   AST 19 12/24/2021 1112   ALT 12 12/24/2021 1112   BILITOT 0.8 12/24/2021 1112       RADIOGRAPHIC STUDIES: CT Chest W Contrast  Result Date: 12/25/2021 CLINICAL DATA:  Stage IA non-small cell left lower lobe lung adenocarcinoma status post left lower lobectomy 05/24/2021. Restaging. * Tracking Code: BO * EXAM: CT CHEST WITH CONTRAST TECHNIQUE: Multidetector CT imaging of the chest was performed during intravenous contrast administration. RADIATION DOSE REDUCTION: This exam was  performed according to the departmental dose-optimization program which includes automated exposure control, adjustment of the mA and/or kV according to patient size and/or use of iterative reconstruction technique. CONTRAST:  37mL OMNIPAQUE IOHEXOL 300 MG/ML  SOLN COMPARISON:  05/11/2021 chest CT.  03/28/2021 PET-CT. FINDINGS: Cardiovascular: Normal heart size. No significant pericardial effusion/thickening. Great vessels are normal in course and caliber. No central pulmonary emboli. Mediastinum/Nodes: No discrete thyroid nodules. Unremarkable esophagus. No pathologically enlarged axillary, mediastinal or hilar lymph nodes. Lungs/Pleura: No pneumothorax. Status post interval left lower lobectomy. Loculated small basilar left pleural effusion with associated smooth left pleural thickening. No right pleural effusion. No acute consolidative airspace disease, lung masses or significant pulmonary nodules. Upper abdomen: Subcentimeter hypodense tiny 0.3 cm central liver lesion (series 2/image 139), too small to characterize. Simple 1.3 cm interpolar right renal cyst and subcentimeter hypodense posterior upper left renal cortical lesion, too small to characterize, for which no follow-up imaging is recommended. Musculoskeletal: No aggressive appearing focal osseous lesions. Mild thoracic spondylosis. IMPRESSION: 1. No evidence of local tumor recurrence status post interval left lower lobectomy. 2. Loculated small basilar left pleural effusion with associated smooth left pleural thickening. 3. No evidence of metastatic disease in the chest. Electronically Signed   By: Ilona Sorrel M.D.   On: 12/25/2021 09:03    ASSESSMENT AND PLAN: This is a very pleasant 76 years old white female with history of stage IA (T1c, N0, M0) non-small cell lung cancer, adenocarcinoma status post left lower lobectomy with lymph node sampling under the care of Dr. Kipp Brood on May 24, 2021.  She is currently on observation and feeling fine  with no concerning complaints. She had repeat CT scan of the chest performed recently.  I personally and independently reviewed the scan and discussed the result with the patient today. Her scan showed no concerning findings for disease recurrence or metastasis. I recommended for her to continue on observation with repeat CT scan of the chest in 6 months. She was advised to call immediately if she has any concerning symptoms in the interval. The patient voices understanding of current disease status and treatment options and is in agreement with the current care plan.  All questions were answered. The patient knows to call the clinic with any problems, questions or concerns. We can certainly see the patient much sooner if necessary.  The total time spent in the appointment was 20 minutes.  Disclaimer: This note was dictated with voice recognition  software. Similar sounding words can inadvertently be transcribed and may not be corrected upon review.

## 2022-01-02 DIAGNOSIS — M25551 Pain in right hip: Secondary | ICD-10-CM | POA: Diagnosis not present

## 2022-01-02 DIAGNOSIS — M7061 Trochanteric bursitis, right hip: Secondary | ICD-10-CM | POA: Diagnosis not present

## 2022-01-03 DIAGNOSIS — D3131 Benign neoplasm of right choroid: Secondary | ICD-10-CM | POA: Diagnosis not present

## 2022-01-03 DIAGNOSIS — H2513 Age-related nuclear cataract, bilateral: Secondary | ICD-10-CM | POA: Diagnosis not present

## 2022-01-03 DIAGNOSIS — H35371 Puckering of macula, right eye: Secondary | ICD-10-CM | POA: Diagnosis not present

## 2022-01-04 DIAGNOSIS — M25551 Pain in right hip: Secondary | ICD-10-CM | POA: Diagnosis not present

## 2022-01-04 DIAGNOSIS — M7061 Trochanteric bursitis, right hip: Secondary | ICD-10-CM | POA: Diagnosis not present

## 2022-01-08 DIAGNOSIS — M7061 Trochanteric bursitis, right hip: Secondary | ICD-10-CM | POA: Diagnosis not present

## 2022-01-08 DIAGNOSIS — M25551 Pain in right hip: Secondary | ICD-10-CM | POA: Diagnosis not present

## 2022-01-10 DIAGNOSIS — M7061 Trochanteric bursitis, right hip: Secondary | ICD-10-CM | POA: Diagnosis not present

## 2022-01-10 DIAGNOSIS — M25551 Pain in right hip: Secondary | ICD-10-CM | POA: Diagnosis not present

## 2022-01-25 DIAGNOSIS — M25551 Pain in right hip: Secondary | ICD-10-CM | POA: Diagnosis not present

## 2022-01-25 DIAGNOSIS — M7061 Trochanteric bursitis, right hip: Secondary | ICD-10-CM | POA: Diagnosis not present

## 2022-01-25 DIAGNOSIS — Z23 Encounter for immunization: Secondary | ICD-10-CM | POA: Diagnosis not present

## 2022-02-04 DIAGNOSIS — Z23 Encounter for immunization: Secondary | ICD-10-CM | POA: Diagnosis not present

## 2022-02-11 DIAGNOSIS — D132 Benign neoplasm of duodenum: Secondary | ICD-10-CM | POA: Diagnosis not present

## 2022-02-15 DIAGNOSIS — Z886 Allergy status to analgesic agent status: Secondary | ICD-10-CM | POA: Diagnosis not present

## 2022-02-15 DIAGNOSIS — Z79899 Other long term (current) drug therapy: Secondary | ICD-10-CM | POA: Diagnosis not present

## 2022-02-15 DIAGNOSIS — Z888 Allergy status to other drugs, medicaments and biological substances status: Secondary | ICD-10-CM | POA: Diagnosis not present

## 2022-02-15 DIAGNOSIS — D509 Iron deficiency anemia, unspecified: Secondary | ICD-10-CM | POA: Diagnosis not present

## 2022-02-15 DIAGNOSIS — D372 Neoplasm of uncertain behavior of small intestine: Secondary | ICD-10-CM | POA: Diagnosis not present

## 2022-02-15 DIAGNOSIS — D132 Benign neoplasm of duodenum: Secondary | ICD-10-CM | POA: Diagnosis not present

## 2022-02-15 DIAGNOSIS — M069 Rheumatoid arthritis, unspecified: Secondary | ICD-10-CM | POA: Diagnosis not present

## 2022-02-15 DIAGNOSIS — K317 Polyp of stomach and duodenum: Secondary | ICD-10-CM | POA: Diagnosis not present

## 2022-02-15 DIAGNOSIS — Z7952 Long term (current) use of systemic steroids: Secondary | ICD-10-CM | POA: Diagnosis not present

## 2022-02-15 DIAGNOSIS — Z8673 Personal history of transient ischemic attack (TIA), and cerebral infarction without residual deficits: Secondary | ICD-10-CM | POA: Diagnosis not present

## 2022-02-15 DIAGNOSIS — G4733 Obstructive sleep apnea (adult) (pediatric): Secondary | ICD-10-CM | POA: Diagnosis not present

## 2022-02-27 DIAGNOSIS — Z8673 Personal history of transient ischemic attack (TIA), and cerebral infarction without residual deficits: Secondary | ICD-10-CM | POA: Diagnosis not present

## 2022-02-27 DIAGNOSIS — M069 Rheumatoid arthritis, unspecified: Secondary | ICD-10-CM | POA: Diagnosis not present

## 2022-02-27 DIAGNOSIS — D132 Benign neoplasm of duodenum: Secondary | ICD-10-CM | POA: Diagnosis not present

## 2022-02-27 DIAGNOSIS — G4733 Obstructive sleep apnea (adult) (pediatric): Secondary | ICD-10-CM | POA: Diagnosis not present

## 2022-02-27 DIAGNOSIS — J90 Pleural effusion, not elsewhere classified: Secondary | ICD-10-CM | POA: Diagnosis not present

## 2022-03-14 DIAGNOSIS — M1990 Unspecified osteoarthritis, unspecified site: Secondary | ICD-10-CM | POA: Diagnosis not present

## 2022-03-14 DIAGNOSIS — Z79899 Other long term (current) drug therapy: Secondary | ICD-10-CM | POA: Diagnosis not present

## 2022-03-14 DIAGNOSIS — M8580 Other specified disorders of bone density and structure, unspecified site: Secondary | ICD-10-CM | POA: Diagnosis not present

## 2022-03-14 DIAGNOSIS — K219 Gastro-esophageal reflux disease without esophagitis: Secondary | ICD-10-CM | POA: Diagnosis not present

## 2022-03-14 DIAGNOSIS — M069 Rheumatoid arthritis, unspecified: Secondary | ICD-10-CM | POA: Diagnosis not present

## 2022-04-09 DIAGNOSIS — Z01818 Encounter for other preprocedural examination: Secondary | ICD-10-CM | POA: Diagnosis not present

## 2022-04-09 DIAGNOSIS — Z902 Acquired absence of lung [part of]: Secondary | ICD-10-CM | POA: Diagnosis not present

## 2022-04-09 DIAGNOSIS — Z9181 History of falling: Secondary | ICD-10-CM | POA: Diagnosis not present

## 2022-04-09 DIAGNOSIS — Z1331 Encounter for screening for depression: Secondary | ICD-10-CM | POA: Diagnosis not present

## 2022-04-09 DIAGNOSIS — Z7189 Other specified counseling: Secondary | ICD-10-CM | POA: Diagnosis not present

## 2022-04-09 DIAGNOSIS — Z7182 Exercise counseling: Secondary | ICD-10-CM | POA: Diagnosis not present

## 2022-04-09 DIAGNOSIS — Z9189 Other specified personal risk factors, not elsewhere classified: Secondary | ICD-10-CM | POA: Diagnosis not present

## 2022-04-09 DIAGNOSIS — G459 Transient cerebral ischemic attack, unspecified: Secondary | ICD-10-CM | POA: Diagnosis not present

## 2022-04-09 DIAGNOSIS — G4733 Obstructive sleep apnea (adult) (pediatric): Secondary | ICD-10-CM | POA: Diagnosis not present

## 2022-04-09 DIAGNOSIS — Z713 Dietary counseling and surveillance: Secondary | ICD-10-CM | POA: Diagnosis not present

## 2022-04-09 DIAGNOSIS — M069 Rheumatoid arthritis, unspecified: Secondary | ICD-10-CM | POA: Diagnosis not present

## 2022-04-09 DIAGNOSIS — D509 Iron deficiency anemia, unspecified: Secondary | ICD-10-CM | POA: Diagnosis not present

## 2022-04-09 DIAGNOSIS — D132 Benign neoplasm of duodenum: Secondary | ICD-10-CM | POA: Diagnosis not present

## 2022-04-15 ENCOUNTER — Telehealth: Payer: Self-pay

## 2022-04-15 NOTE — Telephone Encounter (Signed)
PA sent for Nurtec 75MG   Key: [id number] - PA Case ID: [id number] Pending

## 2022-04-30 DIAGNOSIS — J3489 Other specified disorders of nose and nasal sinuses: Secondary | ICD-10-CM | POA: Diagnosis not present

## 2022-04-30 DIAGNOSIS — R058 Other specified cough: Secondary | ICD-10-CM | POA: Diagnosis not present

## 2022-05-01 DIAGNOSIS — J3489 Other specified disorders of nose and nasal sinuses: Secondary | ICD-10-CM | POA: Diagnosis not present

## 2022-05-01 DIAGNOSIS — R058 Other specified cough: Secondary | ICD-10-CM | POA: Diagnosis not present

## 2022-05-01 DIAGNOSIS — D6489 Other specified anemias: Secondary | ICD-10-CM | POA: Diagnosis not present

## 2022-05-07 DIAGNOSIS — Z886 Allergy status to analgesic agent status: Secondary | ICD-10-CM | POA: Diagnosis not present

## 2022-05-07 DIAGNOSIS — Z9181 History of falling: Secondary | ICD-10-CM | POA: Diagnosis not present

## 2022-05-07 DIAGNOSIS — Z7189 Other specified counseling: Secondary | ICD-10-CM | POA: Diagnosis not present

## 2022-05-07 DIAGNOSIS — Z888 Allergy status to other drugs, medicaments and biological substances status: Secondary | ICD-10-CM | POA: Diagnosis not present

## 2022-05-07 DIAGNOSIS — I700 Atherosclerosis of aorta: Secondary | ICD-10-CM | POA: Diagnosis not present

## 2022-05-07 DIAGNOSIS — F32A Depression, unspecified: Secondary | ICD-10-CM | POA: Diagnosis not present

## 2022-05-07 DIAGNOSIS — F419 Anxiety disorder, unspecified: Secondary | ICD-10-CM | POA: Diagnosis not present

## 2022-05-07 DIAGNOSIS — Z5986 Financial insecurity: Secondary | ICD-10-CM | POA: Diagnosis not present

## 2022-05-07 DIAGNOSIS — Z902 Acquired absence of lung [part of]: Secondary | ICD-10-CM | POA: Diagnosis not present

## 2022-05-07 DIAGNOSIS — Z8673 Personal history of transient ischemic attack (TIA), and cerebral infarction without residual deficits: Secondary | ICD-10-CM | POA: Diagnosis not present

## 2022-05-07 DIAGNOSIS — Z79899 Other long term (current) drug therapy: Secondary | ICD-10-CM | POA: Diagnosis not present

## 2022-05-07 DIAGNOSIS — Z90710 Acquired absence of both cervix and uterus: Secondary | ICD-10-CM | POA: Diagnosis not present

## 2022-05-07 DIAGNOSIS — Z79631 Long term (current) use of antimetabolite agent: Secondary | ICD-10-CM | POA: Diagnosis not present

## 2022-05-07 DIAGNOSIS — R229 Localized swelling, mass and lump, unspecified: Secondary | ICD-10-CM | POA: Diagnosis not present

## 2022-05-07 DIAGNOSIS — G8929 Other chronic pain: Secondary | ICD-10-CM | POA: Diagnosis not present

## 2022-05-07 DIAGNOSIS — G4733 Obstructive sleep apnea (adult) (pediatric): Secondary | ICD-10-CM | POA: Diagnosis not present

## 2022-05-07 DIAGNOSIS — D132 Benign neoplasm of duodenum: Secondary | ICD-10-CM | POA: Diagnosis not present

## 2022-05-07 DIAGNOSIS — Z85828 Personal history of other malignant neoplasm of skin: Secondary | ICD-10-CM | POA: Diagnosis not present

## 2022-05-07 DIAGNOSIS — M069 Rheumatoid arthritis, unspecified: Secondary | ICD-10-CM | POA: Diagnosis not present

## 2022-05-07 DIAGNOSIS — R220 Localized swelling, mass and lump, head: Secondary | ICD-10-CM | POA: Diagnosis not present

## 2022-05-07 DIAGNOSIS — K219 Gastro-esophageal reflux disease without esophagitis: Secondary | ICD-10-CM | POA: Diagnosis not present

## 2022-05-07 DIAGNOSIS — D372 Neoplasm of uncertain behavior of small intestine: Secondary | ICD-10-CM | POA: Diagnosis not present

## 2022-05-07 DIAGNOSIS — D509 Iron deficiency anemia, unspecified: Secondary | ICD-10-CM | POA: Diagnosis not present

## 2022-05-07 DIAGNOSIS — Z9189 Other specified personal risk factors, not elsewhere classified: Secondary | ICD-10-CM | POA: Diagnosis not present

## 2022-05-07 DIAGNOSIS — I358 Other nonrheumatic aortic valve disorders: Secondary | ICD-10-CM | POA: Diagnosis not present

## 2022-05-07 DIAGNOSIS — G8918 Other acute postprocedural pain: Secondary | ICD-10-CM | POA: Diagnosis not present

## 2022-05-07 DIAGNOSIS — Z85118 Personal history of other malignant neoplasm of bronchus and lung: Secondary | ICD-10-CM | POA: Diagnosis not present

## 2022-05-07 DIAGNOSIS — Z7409 Other reduced mobility: Secondary | ICD-10-CM | POA: Diagnosis not present

## 2022-05-07 DIAGNOSIS — Z7952 Long term (current) use of systemic steroids: Secondary | ICD-10-CM | POA: Diagnosis not present

## 2022-05-07 DIAGNOSIS — D649 Anemia, unspecified: Secondary | ICD-10-CM | POA: Diagnosis not present

## 2022-05-08 DIAGNOSIS — Z7409 Other reduced mobility: Secondary | ICD-10-CM | POA: Diagnosis not present

## 2022-05-08 DIAGNOSIS — M069 Rheumatoid arthritis, unspecified: Secondary | ICD-10-CM | POA: Diagnosis not present

## 2022-05-08 DIAGNOSIS — R220 Localized swelling, mass and lump, head: Secondary | ICD-10-CM | POA: Diagnosis not present

## 2022-05-08 DIAGNOSIS — Z9189 Other specified personal risk factors, not elsewhere classified: Secondary | ICD-10-CM | POA: Diagnosis not present

## 2022-05-08 DIAGNOSIS — D132 Benign neoplasm of duodenum: Secondary | ICD-10-CM | POA: Diagnosis not present

## 2022-05-08 DIAGNOSIS — G8918 Other acute postprocedural pain: Secondary | ICD-10-CM | POA: Diagnosis not present

## 2022-05-08 DIAGNOSIS — Z79899 Other long term (current) drug therapy: Secondary | ICD-10-CM | POA: Diagnosis not present

## 2022-05-08 DIAGNOSIS — Z7189 Other specified counseling: Secondary | ICD-10-CM | POA: Diagnosis not present

## 2022-05-08 DIAGNOSIS — Z9181 History of falling: Secondary | ICD-10-CM | POA: Diagnosis not present

## 2022-05-09 DIAGNOSIS — Z9181 History of falling: Secondary | ICD-10-CM | POA: Diagnosis not present

## 2022-05-09 DIAGNOSIS — D132 Benign neoplasm of duodenum: Secondary | ICD-10-CM | POA: Diagnosis not present

## 2022-05-09 DIAGNOSIS — Z9189 Other specified personal risk factors, not elsewhere classified: Secondary | ICD-10-CM | POA: Diagnosis not present

## 2022-05-09 DIAGNOSIS — Z7189 Other specified counseling: Secondary | ICD-10-CM | POA: Diagnosis not present

## 2022-05-09 DIAGNOSIS — G8918 Other acute postprocedural pain: Secondary | ICD-10-CM | POA: Diagnosis not present

## 2022-05-09 DIAGNOSIS — Z7409 Other reduced mobility: Secondary | ICD-10-CM | POA: Diagnosis not present

## 2022-05-09 DIAGNOSIS — M069 Rheumatoid arthritis, unspecified: Secondary | ICD-10-CM | POA: Diagnosis not present

## 2022-05-09 DIAGNOSIS — Z79899 Other long term (current) drug therapy: Secondary | ICD-10-CM | POA: Diagnosis not present

## 2022-05-09 DIAGNOSIS — R220 Localized swelling, mass and lump, head: Secondary | ICD-10-CM | POA: Diagnosis not present

## 2022-05-15 DIAGNOSIS — Z4800 Encounter for change or removal of nonsurgical wound dressing: Secondary | ICD-10-CM | POA: Diagnosis not present

## 2022-05-29 DIAGNOSIS — D132 Benign neoplasm of duodenum: Secondary | ICD-10-CM | POA: Diagnosis not present

## 2022-05-29 DIAGNOSIS — Z4802 Encounter for removal of sutures: Secondary | ICD-10-CM | POA: Diagnosis not present

## 2022-06-11 DIAGNOSIS — K219 Gastro-esophageal reflux disease without esophagitis: Secondary | ICD-10-CM | POA: Diagnosis not present

## 2022-06-20 ENCOUNTER — Ambulatory Visit: Payer: MEDICARE | Admitting: Physician Assistant

## 2022-06-25 ENCOUNTER — Ambulatory Visit (HOSPITAL_COMMUNITY)
Admission: RE | Admit: 2022-06-25 | Discharge: 2022-06-25 | Disposition: A | Discharge status: Home / Self Care | Payer: MEDICARE | Source: Ambulatory Visit | Attending: Internal Medicine | Admitting: Internal Medicine

## 2022-06-25 ENCOUNTER — Inpatient Hospital Stay: Payer: MEDICARE | Attending: Internal Medicine

## 2022-06-25 ENCOUNTER — Other Ambulatory Visit: Payer: Self-pay

## 2022-06-25 DIAGNOSIS — Z85118 Personal history of other malignant neoplasm of bronchus and lung: Secondary | ICD-10-CM | POA: Diagnosis not present

## 2022-06-25 DIAGNOSIS — Z85828 Personal history of other malignant neoplasm of skin: Secondary | ICD-10-CM | POA: Diagnosis not present

## 2022-06-25 DIAGNOSIS — C3490 Malignant neoplasm of unspecified part of unspecified bronchus or lung: Secondary | ICD-10-CM

## 2022-06-25 DIAGNOSIS — J90 Pleural effusion, not elsewhere classified: Secondary | ICD-10-CM | POA: Diagnosis not present

## 2022-06-25 LAB — CBC WITH DIFFERENTIAL (CANCER CENTER ONLY)
Abs Immature Granulocytes: 0.03 10*3/uL (ref 0.00–0.07)
Basophils Absolute: 0 10*3/uL (ref 0.0–0.1)
Basophils Relative: 0 %
Eosinophils Absolute: 0.2 10*3/uL (ref 0.0–0.5)
Eosinophils Relative: 1 %
HCT: 30.3 % — ABNORMAL LOW (ref 36.0–46.0)
Hemoglobin: 8.8 g/dL — ABNORMAL LOW (ref 12.0–15.0)
Immature Granulocytes: 0 %
Lymphocytes Relative: 7 %
Lymphs Abs: 0.8 10*3/uL (ref 0.7–4.0)
MCH: 20.2 pg — ABNORMAL LOW (ref 26.0–34.0)
MCHC: 29 g/dL — ABNORMAL LOW (ref 30.0–36.0)
MCV: 69.7 fL — ABNORMAL LOW (ref 80.0–100.0)
Monocytes Absolute: 0.5 10*3/uL (ref 0.1–1.0)
Monocytes Relative: 5 %
Neutro Abs: 9.8 10*3/uL — ABNORMAL HIGH (ref 1.7–7.7)
Neutrophils Relative %: 87 %
Platelet Count: 309 10*3/uL (ref 150–400)
RBC: 4.35 MIL/uL (ref 3.87–5.11)
RDW: 17.6 % — ABNORMAL HIGH (ref 11.5–15.5)
WBC Count: 11.3 10*3/uL — ABNORMAL HIGH (ref 4.0–10.5)
nRBC: 0 % (ref 0.0–0.2)

## 2022-06-25 LAB — CMP (CANCER CENTER ONLY)
ALT: 13 U/L (ref 0–44)
AST: 19 U/L (ref 15–41)
Albumin: 4 g/dL (ref 3.5–5.0)
Alkaline Phosphatase: 52 U/L (ref 38–126)
Anion gap: 5 (ref 5–15)
BUN: 21 mg/dL (ref 8–23)
CO2: 32 mmol/L (ref 22–32)
Calcium: 8.7 mg/dL — ABNORMAL LOW (ref 8.9–10.3)
Chloride: 103 mmol/L (ref 98–111)
Creatinine: 0.73 mg/dL (ref 0.44–1.00)
GFR, Estimated: >60 mL/min (ref >60)
Glucose, Bld: 92 mg/dL (ref 70–99)
Potassium: 3.7 mmol/L (ref 3.5–5.1)
Sodium: 140 mmol/L (ref 135–145)
Total Bilirubin: 0.6 mg/dL (ref 0.3–1.2)
Total Protein: 6.1 g/dL — ABNORMAL LOW (ref 6.5–8.1)

## 2022-06-25 MED ORDER — IOHEXOL 300 MG/ML  SOLN
75.0000 mL | Freq: Once | INTRAMUSCULAR | Status: AC | PRN
Start: 2022-06-25 — End: 2022-06-25
  Administered 2022-06-25: 75 mL via INTRAVENOUS

## 2022-06-25 MED ORDER — SODIUM CHLORIDE (PF) 0.9 % IJ SOLN
INTRAMUSCULAR | Status: AC
  Filled 2022-06-25: qty 50

## 2022-06-27 ENCOUNTER — Inpatient Hospital Stay (HOSPITAL_BASED_OUTPATIENT_CLINIC_OR_DEPARTMENT_OTHER): Payer: MEDICARE | Admitting: Internal Medicine

## 2022-06-27 ENCOUNTER — Other Ambulatory Visit: Payer: Self-pay

## 2022-06-27 VITALS — BP 112/76 | HR 80 | Temp 98.2°F | Resp 16 | Wt 104.3 lb

## 2022-06-27 DIAGNOSIS — Z85118 Personal history of other malignant neoplasm of bronchus and lung: Secondary | ICD-10-CM | POA: Diagnosis not present

## 2022-06-27 DIAGNOSIS — Z85828 Personal history of other malignant neoplasm of skin: Secondary | ICD-10-CM | POA: Diagnosis not present

## 2022-06-27 DIAGNOSIS — C3490 Malignant neoplasm of unspecified part of unspecified bronchus or lung: Secondary | ICD-10-CM

## 2022-06-27 NOTE — Progress Notes (Signed)
[place] [phone]   Fax:([phone]) [place], MD [address] [place] [address]  DIAGNOSIS:  Stage IA (T1c, N0, M0) non-small cell lung cancer, adenocarcinoma presented with left lower lobe lung mass   PRIOR THERAPY: Status post left lower lobectomy with lymph node sampling on May 24, 2021 under the care of Dr. Kipp Brood.  CURRENT THERAPY: Observation  INTERVAL HISTORY: Bambie Pizzolato Swaney 77 y.o. female returns to the clinic today for follow-up visit accompanied by her spouse Remo Lipps.  The patient is feeling fine today with no concerning complaints.  She is recovering from a recent surgery for distal gastrectomy and proximal duodenectomy for focal high-grade dysplasia and tubulovillous adenoma under the care of Dr. Donnal Moat.  She denied having any current chest pain, shortness of breath, cough or hemoptysis.  She has no nausea, vomiting, diarrhea or constipation.  She has no headache or visual changes.  She has no recent weight loss or night sweats.  She had repeat CT scan of the chest performed recently and she is here for evaluation and discussion of her scan results.  MEDICAL HISTORY: Past Medical History:  Diagnosis Date   Anemia    Anxiety    Arthritis    Atypical mole 01/08/2013   right inner forearm   Basal cell carcinoma 03/12/2017   right concha exc mohs   Depression    Dyspnea    GERD (gastroesophageal reflux disease)    HA (headache)    Migraine    Sleep apnea    cpap    ALLERGIES:  is allergic to aspirin, ubrogepant, and venlafaxine.  MEDICATIONS:  Current Outpatient Medications  Medication Sig Dispense Refill   acetaminophen (TYLENOL) 500 MG tablet Take 500 mg by mouth every 6 (six) hours as needed.     atorvastatin (LIPITOR) 80 MG tablet Take 1 tablet (80 mg total) by mouth daily. 30 tablet 1   cholecalciferol (VITAMIN D3) 25 MCG (1000 UNIT) tablet Take 1,000 Units  by mouth daily.     Cyanocobalamin (B-12 PO) Take 1 tablet by mouth daily.     fluticasone (FLONASE) 50 MCG/ACT nasal spray Place 1 spray into both nostrils daily as needed for allergies.     folic acid (FOLVITE) 1 MG tablet Take 1 mg by mouth daily.     Iron, Ferrous Sulfate, 325 (65 Fe) MG TABS 1 tablet     levocetirizine (XYZAL) 5 MG tablet Take 5 mg by mouth at bedtime as needed for allergies.     Magnesium 400 MG CAPS Take 400 mg by mouth every other day.     methotrexate (RHEUMATREX) 2.5 MG tablet Take 15 mg by mouth every Thursday.     olopatadine (PATANOL) 0.1 % ophthalmic solution Place 1 drop into both eyes daily as needed for allergies.     ondansetron (ZOFRAN ODT) 4 MG disintegrating tablet Take 1 tablet (4 mg total) by mouth every 8 (eight) hours as needed for nausea or vomiting. 20 tablet 6   Polyethyl Glycol-Propyl Glycol (SYSTANE OP) Place 1 drop into both eyes as needed (for dry eyes).     predniSONE (DELTASONE) 5 MG tablet Take 5 mg by mouth 2 (two) times daily with a meal.     Rimegepant Sulfate (NURTEC) 75 MG TBDP Take 1 tab at onset of migraine.  May repeat in 2 hrs, if needed.  Max dose: 2 tabs/day. This is a 30 day prescription. (Patient  not taking: Reported on 12/27/2021) 12 tablet 11   RITUXIMAB IV Inject into the vein.     triamcinolone cream (KENALOG) 0.1 % Apply 1 application topically as needed (for skin irritation).     No current facility-administered medications for this visit.    SURGICAL HISTORY:  Past Surgical History:  Procedure Laterality Date   ABDOMINAL HYSTERECTOMY     BRONCHIAL BIOPSY  05/24/2021   Procedure: BRONCHIAL BIOPSIES;  Surgeon: [name], DO;  Location: [facility];  Service: Pulmonary;;   BRONCHIAL BRUSHINGS  05/24/2021   Procedure: BRONCHIAL BRUSHINGS;  Surgeon: [name], DO;  Location: [facility];  Service: Pulmonary;;   BRONCHIAL NEEDLE ASPIRATION BIOPSY  05/24/2021   Procedure: BRONCHIAL NEEDLE ASPIRATION BIOPSIES;   Surgeon: [name], DO;  Location: [facility];  Service: Pulmonary;;   BUNIONECTOMY     both   FIDUCIAL MARKER PLACEMENT  05/24/2021   Procedure: FIDUCIAL DYE MARKING;  Surgeon: [name], DO;  Location: [facility];  Service: Pulmonary;;   FINGER ARTHROPLASTY Right 07/30/2012   Procedure: RIGHT INDEX FINGER, MIDDLE FINGER, RING FINGER, SMALL FINGER MCP ARTHROPLASTY WITH REALIGNMENT/REPAIR OF EXTENSOR TENDON;  Surgeon: Roseanne Kaufman, MD;  Location: [facility];  Service: Orthopedics;  Laterality: Right;   FINGER SURGERY     HAMMER TOE SURGERY Left 03/03/2014   Procedure: SECOND THROUGH FIFTH HAMMERTOE CORRECTION;  Surgeon: Wylene Simmer, MD;  Location: [facility];  Service: Orthopedics;  Laterality: Left;   INTERCOSTAL NERVE BLOCK Left 05/24/2021   Procedure: INTERCOSTAL NERVE BLOCK;  Surgeon: Lajuana Matte, MD;  Location: [facility];  Service: Thoracic;  Laterality: Left;   JOINT REPLACEMENT     LYMPH NODE DISSECTION Left 05/24/2021   Procedure: LYMPH NODE DISSECTION;  Surgeon: Lajuana Matte, MD;  Location: [facility];  Service: Thoracic;  Laterality: Left;   METATARSAL HEAD EXCISION Left 03/03/2014   Procedure: LEFT SECOND THROUGH FIFTH METATARSAL HEAD EXCISION;  Surgeon: Wylene Simmer, MD;  Location: [facility];  Service: Orthopedics;  Laterality: Left;   OOPHORECTOMY     VIDEO BRONCHOSCOPY Bilateral 12/18/2016   Procedure: VIDEO BRONCHOSCOPY WITH FLUORO;  Surgeon: Tanda Rockers, MD;  Location: [facility];  Service: Cardiopulmonary;  Laterality: Bilateral;   VIDEO BRONCHOSCOPY WITH ENDOBRONCHIAL NAVIGATION Left 02/26/2017   Procedure: VIDEO BRONCHOSCOPY WITH ENDOBRONCHIAL NAVIGATION;  Surgeon: Collene Gobble, MD;  Location: [facility];  Service: Thoracic;  Laterality: Left;   VIDEO BRONCHOSCOPY WITH RADIAL ENDOBRONCHIAL ULTRASOUND  05/24/2021   Procedure: VIDEO BRONCHOSCOPY WITH RADIAL ENDOBRONCHIAL ULTRASOUND;  Surgeon: [name], DO;  Location: [facility];  Service: Pulmonary;;    REVIEW OF SYSTEMS:  A comprehensive review of systems was negative.   PHYSICAL EXAMINATION: General appearance: alert, cooperative, and no distress Head: Normocephalic, without obvious abnormality, atraumatic Neck: no adenopathy, no JVD, supple, symmetrical, trachea midline, and thyroid not enlarged, symmetric, no tenderness/mass/nodules Lymph nodes: Cervical, supraclavicular, and axillary nodes normal. Resp: clear to auscultation bilaterally Back: symmetric, no curvature. ROM normal. No CVA tenderness. Cardio: regular rate and rhythm, S1, S2 normal, no murmur, click, rub or gallop GI: soft, non-tender; bowel sounds normal; no masses,  no organomegaly Extremities: extremities normal, atraumatic, no cyanosis or edema  ECOG PERFORMANCE STATUS: 0 - Asymptomatic  Blood pressure 112/76, pulse 80, temperature 98.2 F (36.8 C), temperature source Oral, resp. rate 16, weight 104 lb 4.8 oz (47.3 kg), SpO2 100 %.  LABORATORY DATA: Lab Results  Component Value Date   WBC 11.3 (  H) 06/25/2022   HGB 8.8 (L) 06/25/2022   HCT 30.3 (L) 06/25/2022   MCV 69.7 (L) 06/25/2022   PLT 309 06/25/2022      Chemistry      Component Value Date/Time   NA 140 06/25/2022 1146   NA 141 08/29/2020 1511   K 3.7 06/25/2022 1146   CL 103 06/25/2022 1146   CO2 32 06/25/2022 1146   BUN 21 06/25/2022 1146   BUN 24 08/29/2020 1511   CREATININE 0.73 06/25/2022 1146      Component Value Date/Time   CALCIUM 8.7 (L) 06/25/2022 1146   ALKPHOS 52 06/25/2022 1146   AST 19 06/25/2022 1146   ALT 13 06/25/2022 1146   BILITOT 0.6 06/25/2022 1146       RADIOGRAPHIC STUDIES: CT Chest W Contrast  Result Date: 06/26/2022 CLINICAL DATA:  Restaging non-small cell lung cancer. Previous left lower lobectomy. * Tracking Code: BO * EXAM: CT CHEST WITH CONTRAST TECHNIQUE: Multidetector CT imaging of the chest was performed during intravenous contrast administration. RADIATION DOSE  REDUCTION: This exam was performed according to the departmental dose-optimization program which includes automated exposure control, adjustment of the mA and/or kV according to patient size and/or use of iterative reconstruction technique. CONTRAST:  22mL OMNIPAQUE IOHEXOL 300 MG/ML  SOLN COMPARISON:  Chest CT 12/24/2021 and 05/11/2021. FINDINGS: Cardiovascular: No acute vascular findings. Aortic valvular calcifications are noted. The heart size is normal. There is no pericardial effusion. Mediastinum/Nodes: There are no enlarged mediastinal, hilar or axillary lymph nodes. The thyroid gland, trachea and esophagus demonstrate no significant findings. Lungs/Pleura: Postsurgical changes from previous left lower lobectomy with stable small loculated left pleural effusion posteriorly and inferiorly. Stable minimal biapical scarring. No suspicious pulmonary nodules. Upper abdomen: No acute findings are seen in the visualized upper abdomen. There are stable small low-density lesions centrally in the liver and in the upper poles of both kidneys which are likely cysts; no follow-up imaging recommended. 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 Musculoskeletal/Chest wall: There is no chest wall mass or suspicious osseous finding. Mild thoracolumbar scoliosis and spondylosis. IMPRESSION: 1. Stable chest CT status post left lower lobectomy. 2. No evidence of local recurrence or metastatic disease. 3.  Aortic Atherosclerosis (ICD10-I70.0). Electronically Signed   By: Richardean Sale M.D.   On: 06/26/2022 13:49    ASSESSMENT AND PLAN: This is a very pleasant 77 years old white female with history of stage IA (T1c, N0, M0) non-small cell lung cancer, adenocarcinoma status post left lower lobectomy with lymph node sampling under the care of Dr. Kipp Brood on May 24, 2021.   The patient is currently on observation and she is feeling fine with no concerning complaints. She had repeat CT scan of the chest  performed recently.  I personally and independently reviewed the scan and discussed the result with the patient and her partner today.  Her scan showed no concerning findings for disease recurrence. I recommended for the patient to continue on observation with repeat CT scan of the chest in 1 year. She was advised to call immediately if she has any other concerning symptoms in the interval. The patient voices understanding of current disease status and treatment options and is in agreement with the current care plan.  All questions were answered. The patient knows to call the clinic with any problems, questions or concerns. We can certainly see the patient much sooner if necessary.  The total time spent in the appointment was 20 minutes.  Disclaimer: This note  was dictated with voice recognition software. Similar sounding words can inadvertently be transcribed and may not be corrected upon review.

## 2022-07-01 ENCOUNTER — Ambulatory Visit: Payer: Self-pay | Admitting: *Deleted

## 2022-07-01 ENCOUNTER — Encounter: Payer: Self-pay | Admitting: *Deleted

## 2022-07-01 NOTE — Patient Instructions (Signed)
Visit Information  Thank you for taking time to visit with me today. Please don't hesitate to contact me if I can be of assistance to you.   Following are the goals we discussed today:   Goals Addressed             This Visit's Progress    Health and [place] and task to complete in order to accomplish goals.   EMOTIONAL / MENTAL HEALTH SUPPORT Start / continue relaxed breathing 3 times daily  Self Support options  (focus on the positive/positive self-talk, consider counseling (for depression, hoarding, etc as well as grief counseling) Personal steps you want to take over the next few weeks ( review material being mailed to you to consider: Advance Directives, PREP program at local Y, counseling)         Our next appointment is by telephone on 07/22/22   Please call the care guide team at [phone] if you need to cancel or reschedule your appointment.   If you are experiencing a Mental Health or [place] or need someone to talk to, please call the Suicide and Crisis Lifeline: 988 call 911   Patient verbalizes understanding of instructions and care plan provided today and agrees to view in [place]. Active MyChart status and patient understanding of how to access instructions and care plan via MyChart confirmed with patient.     Telephone follow up appointment with care management team member scheduled for:07/22/22   Eduard Clos, MSW, [place] Worker [facility] [phone]

## 2022-07-01 NOTE — Patient Outreach (Signed)
  Care Coordination   Initial Visit Note   07/01/2022 Name: Kendra Taylor MRN: [id number] DOB: 04-23-1946  Kendra Taylor is a 77 y.o. year old female who sees Deland Pretty, MD for primary care. I spoke with  Laurin Coder Lallier by phone today.  What matters to the patients health and wellness today?  Pt agreeable to Care Coordination program- optimize health and mental health wellness.    Goals Addressed             This Visit's Progress    Health and [place]       Activities and task to complete in order to accomplish goals.   EMOTIONAL / [place] Start / continue relaxed breathing 3 times daily  Self Support options  (focus on the positive/positive self-talk, consider counseling (for depression, hoarding, etc as well as grief counseling) Personal steps you want to take over the next few weeks ( review material being mailed to you to consider: Advance Directives, PREP program at local Y, counseling)         SDOH assessments and interventions completed:  Yes  SDOH Interventions Today    Flowsheet Row Most Recent Value  SDOH Interventions   Food Insecurity Interventions Intervention Not Indicated  Transportation Interventions Intervention Not Indicated  Utilities Interventions Intervention Not Indicated  Alcohol Usage Interventions Intervention Not Indicated (Score <7)  Depression Interventions/Treatment  Counseling, Referral to Psychiatry  Financial Strain Interventions Intervention Not Indicated  Physical Activity Interventions Local [facility], PREP Program  Stress Interventions Provide Counseling  [lots of medical issues in past year]  Social Connections Interventions Intervention Not Indicated        Care Coordination Interventions:  Yes, provided   Follow up plan: Follow up call scheduled for 07/22/22    Encounter Outcome:  Pt. Visit Completed

## 2022-07-22 ENCOUNTER — Ambulatory Visit: Payer: Self-pay | Admitting: *Deleted

## 2022-07-22 NOTE — Patient Instructions (Signed)
Visit Information  Thank you for taking time to visit with me today. Please don't hesitate to contact me if I can be of assistance to you.   Following are the goals we discussed today:   Goals Addressed             This Visit's Progress    Health and [place] and task to complete in order to accomplish goals.   EMOTIONAL / [place] / continue relaxed breathing 3 times daily  Self Support options  (focus on the positive/positive self-talk, consider counseling (for depression, hoarding, etc as well as grief counseling) Review material received by mail: Advance Directives, PREP program at local Y, counseling) Continue to recover from [place]- reach out to PCP if symptoms are worsening         Our next appointment is by telephone on 08/21/22   Please call the care guide team at [phone] if you need to cancel or reschedule your appointment.   If you are experiencing a Mental Health or [place] or need someone to talk to, please call the Suicide and Crisis Lifeline: 988 call 911   The patient verbalized understanding of instructions, educational materials, and care plan provided today and DECLINED offer to receive copy of patient instructions, educational materials, and care plan.   Telephone follow up appointment with care management team member scheduled for: 08/21/22 Eduard Clos, MSW, [place] Worker [facility] [phone]

## 2022-07-22 NOTE — Patient Outreach (Signed)
  Care Coordination   Follow Up Visit Note   07/22/2022 Name: Kendra Taylor MRN: [id number] DOB: 04-23-1946  Kendra Taylor is a 77 y.o. year old female who sees Deland Pretty, MD for primary care. I spoke with  Laurin Coder [place] by phone today.  What matters to the patients health and wellness today?  "Recovering from [place]".    Goals Addressed             This Visit's Progress    Health and [place]       Activities and task to complete in order to accomplish goals.   EMOTIONAL / [place] / continue relaxed breathing 3 times daily  Self Support options  (focus on the positive/positive self-talk, consider counseling (for depression, hoarding, etc as well as grief counseling) Review material received by mail: Advance Directives, PREP program at local Y, counseling) Continue to recover from [place]- reach out to PCP if symptoms are worsening         SDOH assessments and interventions completed:  Yes     Care Coordination Interventions:  Yes, provided  Interventions Today    Flowsheet Row Most Recent Value  Chronic Disease   Chronic disease during today's visit Other  [Rheumatoid Arthritis]  General Interventions   General Interventions Discussed/Reviewed Community Resources  Mental Health Interventions   Mental Health Discussed/Reviewed Mental Health Discussed, Coping Strategies, Depression, Grief and Loss  [CSW mailed pt resources for local Counselors to consider- specializing in hoarding.]  Advanced Directive Interventions   Advanced Directives Discussed/Reviewed Provided resource for acquiring and filling out documents       Follow up plan: Follow up call scheduled for 08/21/22    Encounter Outcome:  Pt. Visit Completed

## 2022-08-12 DIAGNOSIS — R300 Dysuria: Secondary | ICD-10-CM | POA: Diagnosis not present

## 2022-08-12 DIAGNOSIS — R8289 Other abnormal findings on cytological and histological examination of urine: Secondary | ICD-10-CM | POA: Diagnosis not present

## 2022-08-21 ENCOUNTER — Ambulatory Visit: Payer: Self-pay | Admitting: *Deleted

## 2022-08-21 DIAGNOSIS — K219 Gastro-esophageal reflux disease without esophagitis: Secondary | ICD-10-CM | POA: Diagnosis not present

## 2022-08-21 DIAGNOSIS — M1990 Unspecified osteoarthritis, unspecified site: Secondary | ICD-10-CM | POA: Diagnosis not present

## 2022-08-21 DIAGNOSIS — Z79899 Other long term (current) drug therapy: Secondary | ICD-10-CM | POA: Diagnosis not present

## 2022-08-21 DIAGNOSIS — M8580 Other specified disorders of bone density and structure, unspecified site: Secondary | ICD-10-CM | POA: Diagnosis not present

## 2022-08-21 DIAGNOSIS — M069 Rheumatoid arthritis, unspecified: Secondary | ICD-10-CM | POA: Diagnosis not present

## 2022-08-21 NOTE — Patient Instructions (Signed)
Visit Information  Thank you for taking time to visit with me today. Please don't hesitate to contact me if I can be of assistance to you.   Following are the goals we discussed today:   Goals Addressed             This Visit's Progress    Health and Mental Health Wellness       Activities and task to complete in order to accomplish goals.   Continue to review options and consider/contact for personal counseling support, exercise programs (Silver Sneakers, PREP, etc) and  Start / continue relaxed breathing 3 times daily Self Support options  (focus on the positive/positive self-talk, consider counseling (for depression, hoarding, etc as well as grief counseling) Consider completion (sign in front of witnesses and Notary)the Advance Directives packet Glad you have recovered from COVID-           Our next appointment is by telephone on 09/19/22    Please call the care guide team at [phone] if you need to cancel or reschedule your appointment.   If you are experiencing a Mental Health or Behavioral Health Crisis or need someone to talk to, please call 911   The patient verbalized understanding of instructions, educational materials, and care plan provided today and DECLINED offer to receive copy of patient instructions, educational materials, and care plan.   Telephone follow up appointment with care management team member scheduled for: 09/19/22 Reece Levy, MSW, LCSW Clinical Social Worker [facility] [phone]

## 2022-08-21 NOTE — Patient Outreach (Signed)
  Care Coordination   Follow Up Visit Note   08/21/2022 Name: Sherry Blackard MRN: [id number] DOB: [birth year]  Jennell Janosik is a 77 y.o. year old female who sees Merri Brunette, MD for primary care. I spoke with  Kristopher Glee Faison by phone today.  What matters to the patients health and wellness today?  Recovered from COVID and doing well.    Goals Addressed             This Visit's Progress    Health and Mental Health Wellness       Activities and task to complete in order to accomplish goals.   Continue to review options and consider/contact for personal counseling support, exercise programs (Silver Sneakers, PREP, etc) and  Start / continue relaxed breathing 3 times daily Self Support options  (focus on the positive/positive self-talk, consider counseling (for depression, hoarding, etc as well as grief counseling) Consider completion (sign in front of witnesses and Notary)the Advance Directives packet Glad you have recovered from COVID-           SDOH assessments and interventions completed:  Yes     Care Coordination Interventions:  Yes, provided  Interventions Today    Flowsheet Row Most Recent Value  General Interventions   General Interventions Discussed/Reviewed [employer] exercise programs, counseling and other]  Exercise Interventions   Exercise Discussed/Reviewed Exercise Discussed  [Discussed the [employer] program as well as PREP at [facility] [employer] [facility]]  Mental Health Interventions   Mental Health Discussed/Reviewed Mental Health Discussed, Coping Strategies, Other  [Provided list of local counselors who specialize in work with hoarders]  Advanced Directive Interventions   Advanced Directives Discussed/Reviewed Advanced Directives Discussed, Provided resource for acquiring and filling out documents       Follow up plan: Follow up call scheduled for 09/19/22    Encounter Outcome:  Pt. Visit Completed

## 2022-08-26 DIAGNOSIS — Z08 Encounter for follow-up examination after completed treatment for malignant neoplasm: Secondary | ICD-10-CM | POA: Diagnosis not present

## 2022-08-26 DIAGNOSIS — D225 Melanocytic nevi of trunk: Secondary | ICD-10-CM | POA: Diagnosis not present

## 2022-08-26 DIAGNOSIS — L720 Epidermal cyst: Secondary | ICD-10-CM | POA: Diagnosis not present

## 2022-08-26 DIAGNOSIS — Z1283 Encounter for screening for malignant neoplasm of skin: Secondary | ICD-10-CM | POA: Diagnosis not present

## 2022-08-26 DIAGNOSIS — Z85828 Personal history of other malignant neoplasm of skin: Secondary | ICD-10-CM | POA: Diagnosis not present

## 2022-08-26 DIAGNOSIS — L821 Other seborrheic keratosis: Secondary | ICD-10-CM | POA: Diagnosis not present

## 2022-08-26 DIAGNOSIS — L820 Inflamed seborrheic keratosis: Secondary | ICD-10-CM | POA: Diagnosis not present

## 2022-08-26 DIAGNOSIS — L568 Other specified acute skin changes due to ultraviolet radiation: Secondary | ICD-10-CM | POA: Diagnosis not present

## 2022-08-26 DIAGNOSIS — D485 Neoplasm of uncertain behavior of skin: Secondary | ICD-10-CM | POA: Diagnosis not present

## 2022-08-27 DIAGNOSIS — M0589 Other rheumatoid arthritis with rheumatoid factor of multiple sites: Secondary | ICD-10-CM | POA: Diagnosis not present

## 2022-09-10 DIAGNOSIS — M0589 Other rheumatoid arthritis with rheumatoid factor of multiple sites: Secondary | ICD-10-CM | POA: Diagnosis not present

## 2022-09-12 DIAGNOSIS — R030 Elevated blood-pressure reading, without diagnosis of hypertension: Secondary | ICD-10-CM | POA: Diagnosis not present

## 2022-09-19 ENCOUNTER — Ambulatory Visit: Payer: Self-pay | Admitting: *Deleted

## 2022-09-20 NOTE — Patient Outreach (Signed)
  Care Coordination   Follow Up Visit Note   09/20/2022 Name: Kendra Taylor MRN: [id number] DOB: 05/20/1945  Kendra Taylor is a 77 y.o. year old female who sees Merri Brunette, MD for primary care. I spoke with  Kristopher Glee Class by phone today.  What matters to the patients health and wellness today? Health and mental health wellness.    Goals Addressed             This Visit's Progress    Health and Mental Health Wellness       Activities and task to complete in order to accomplish goals.   Continue to review options and consider/contact for personal counseling support, exercise programs (Silver [employer], PREP, etc)  Self Support options  (focus on the positive/positive self-talk, consider counseling (for depression, hoarding, etc as well as grief counseling) Consider completion (sign in front of witnesses and Notary)the Advance Directives packet Glad you have been traveling, volunteering, etc           SDOH assessments and interventions completed:  Yes        Care Coordination Interventions:  Yes, provided  Interventions Today    Flowsheet Row Most Recent Value  General Interventions   General Interventions Discussed/Reviewed Community Resources  Mental Health Interventions   Mental Health Discussed/Reviewed Coping Strategies  [Pt continues to consider counseling/support. pt reflecting on her hoarding and questions related to this.CSW encouraged pt to consider connecting with therapy to help with this.]       Follow up plan: Follow up call scheduled for 10/23/22    Encounter Outcome:  Pt. Visit Completed

## 2022-09-20 NOTE — Patient Instructions (Signed)
Visit Information  Thank you for taking time to visit with me today. Please don't hesitate to contact me if I can be of assistance to you.   Following are the goals we discussed today:   Goals Addressed             This Visit's Progress    Health and Mental Health Wellness       Activities and task to complete in order to accomplish goals.   Continue to review options and consider/contact for personal counseling support, exercise programs (Silver Sneakers, PREP, etc)  Self Support options  (focus on the positive/positive self-talk, consider counseling (for depression, hoarding, etc as well as grief counseling) Consider completion (sign in front of witnesses and Notary)the Advance Directives packet Glad you have been traveling, volunteering, etc           Our next appointment is by telephone on 10/23/22  Please call the care guide team at [phone] if you need to cancel or reschedule your appointment.   If you are experiencing a Mental Health or Behavioral Health Crisis or need someone to talk to, please call the Suicide and Crisis Lifeline: 988 call 911   The patient verbalized understanding of instructions, educational materials, and care plan provided today and DECLINED offer to receive copy of patient instructions, educational materials, and care plan.   Telephone follow up appointment with care management team member scheduled for:10/23/22  Reece Levy, MSW, LCSW Clinical Social Worker [facility] [phone]

## 2022-09-25 DIAGNOSIS — M0589 Other rheumatoid arthritis with rheumatoid factor of multiple sites: Secondary | ICD-10-CM | POA: Diagnosis not present

## 2022-09-26 IMAGING — MR MR HEAD W/O CM
12 of 13 series · 44 of 48 positions shown · non-contrast
Comparison: No pertinent prior exam.

CLINICAL DATA: Transient ischemic attack (TIA); Neuro deficit,
acute, stroke suspected

EXAM:
MRI HEAD WITHOUT CONTRAST
MRA HEAD WITHOUT CONTRAST
TECHNIQUE: Multiplanar, multi-echo pulse sequences of the brain and surrounding
structures were acquired without intravenous contrast. Angiographic
images of the Circle of Willis were acquired using MRA technique
without intravenous contrast.

[Series 5: ax dwi_tracew · axial · 3.0mm · 0.88mm/px · z∈[-24,+116]mm · 7 of 96 slices shown]
[im 1/96]
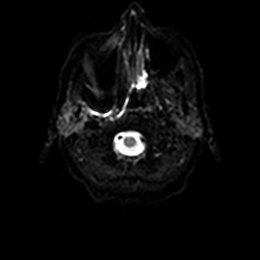
[im 16/96]
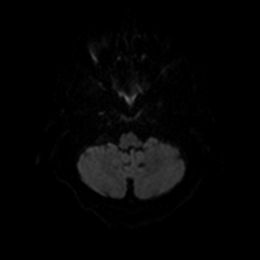
[im 32/96]
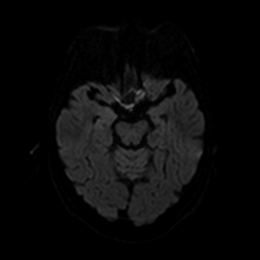
[im 48/96]
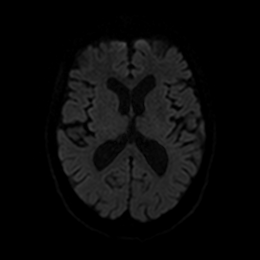
[im 64/96]
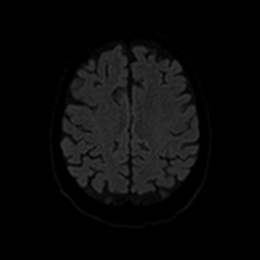
[im 80/96]
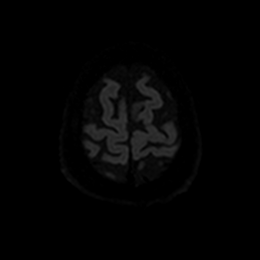
[im 96/96]
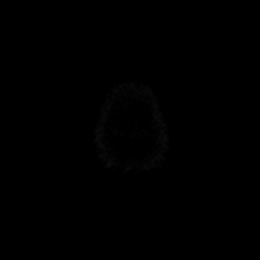

[Series 6: ax dwi_adc · axial · 3.0mm · 0.88mm/px · z∈[-24,+116]mm · 4 of 48 slices shown]
[im 1/48]
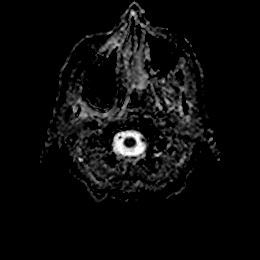
[im 16/48]
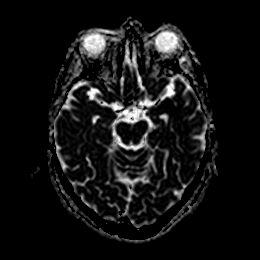
[im 32/48]
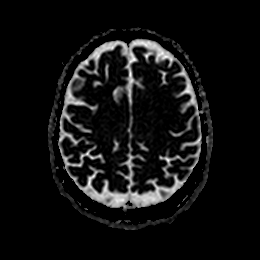
[im 48/48]
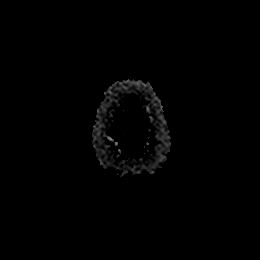

[Series 7: DWI · coronal · 4.0mm · 0.88mm/px · 6 of 72 slices shown (1 of 2)]
[im 1/72]
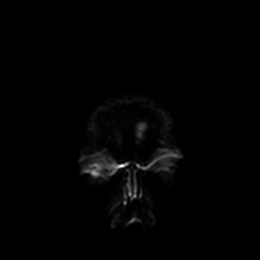
[im 15/72]
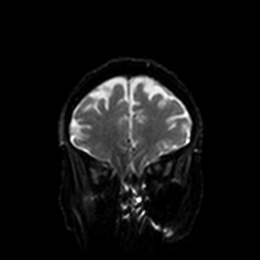
[im 29/72]
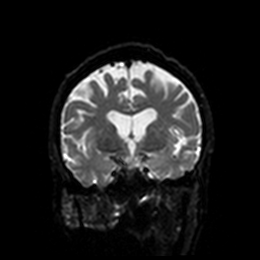
[im 43/72]
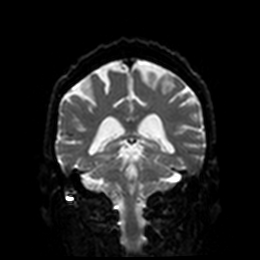
[im 57/72]
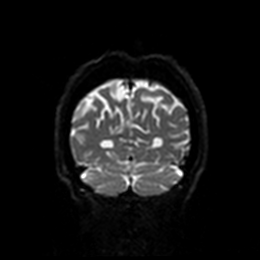
[im 72/72]
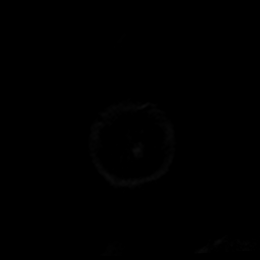

[Series 8: DWI · coronal · 4.0mm · 0.88mm/px · 3 of 36 slices shown (2 of 2)]
[im 1/36]
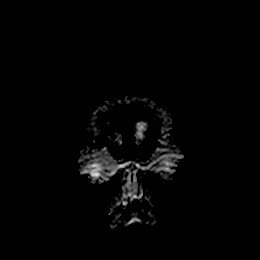
[im 18/36]
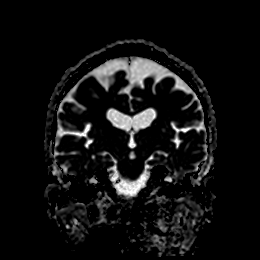
[im 36/36]
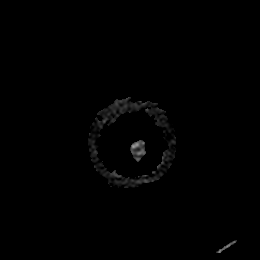

[Series 9: T1 · sagittal · 5.0mm · 0.75mm/px · 2 of 25 slices shown]
[im 1/25]
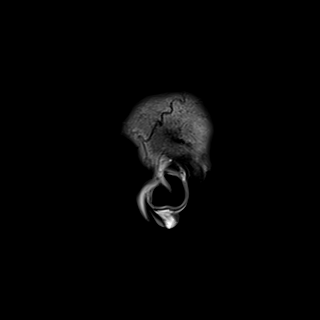
[im 25/25]
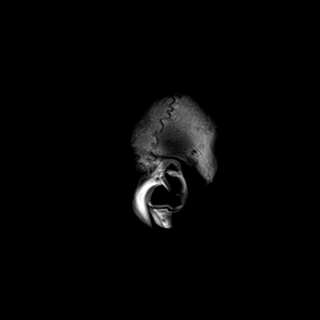

[Series 10: T2 · axial · 5.0mm · 0.72mm/px · z∈[-25,+118]mm · 2 of 25 slices shown (1 of 2)]
[im 1/25]
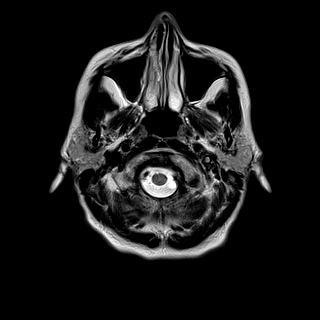
[im 25/25]
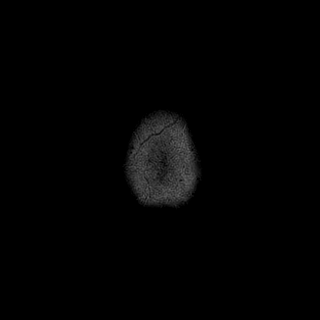

[Series 11: FLAIR · axial · 5.0mm · 0.45mm/px · z∈[-27,+115]mm · 2 of 25 slices shown]
[im 1/25]
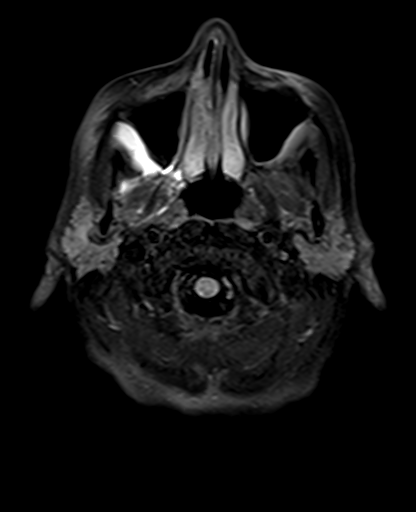
[im 25/25]
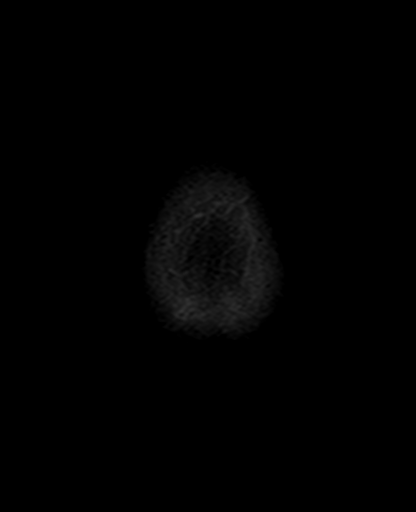

[Series 12: mag_images · axial · 3.0mm · 0.90mm/px · z∈[-32,+120]mm · 4 of 52 slices shown]
[im 1/52]
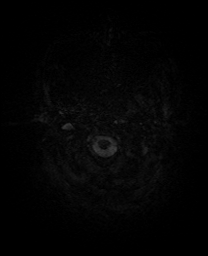
[im 18/52]
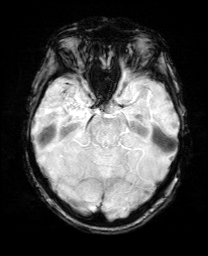
[im 35/52]
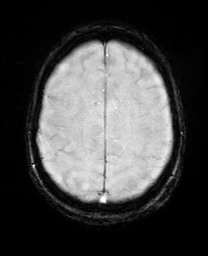
[im 52/52]
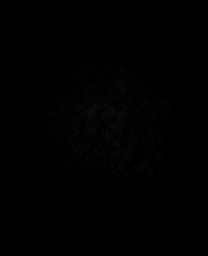

[Series 13: pha_images · axial · 3.0mm · 0.90mm/px · z∈[-32,+114]mm · 4 of 50 slices shown]
[im 1/50]
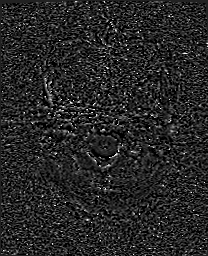
[im 17/50]
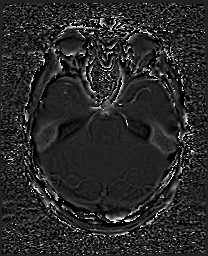
[im 33/50]
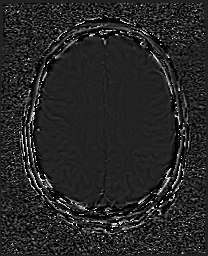
[im 50/50]
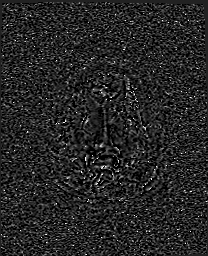

[Series 14: swi_images · axial · 3.0mm · 0.90mm/px · z∈[-32,+120]mm · 4 of 52 slices shown]
[im 1/52]
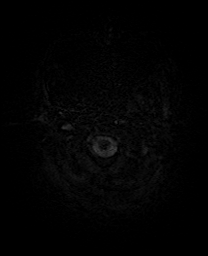
[im 18/52]
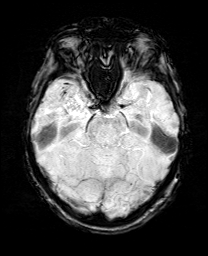
[im 35/52]
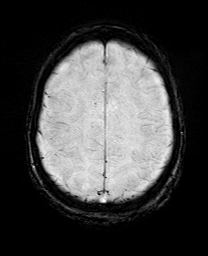
[im 52/52]
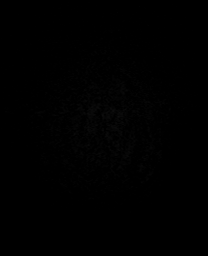

[Series 15: mip_images(sw) · axial · 24.0mm · 0.90mm/px · z∈[-22,+109]mm · 4 of 45 slices shown]
[im 1/45]
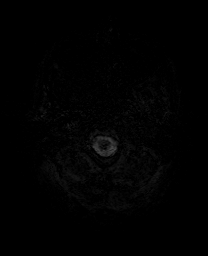
[im 15/45]
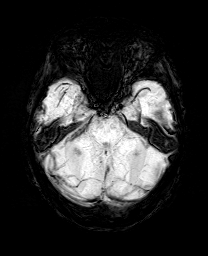
[im 30/45]
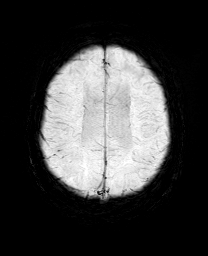
[im 45/45]
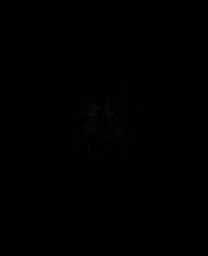

[Series 17: T2 · coronal · 5.0mm · 0.34mm/px · 2 of 29 slices shown (2 of 2)]
[im 1/29]
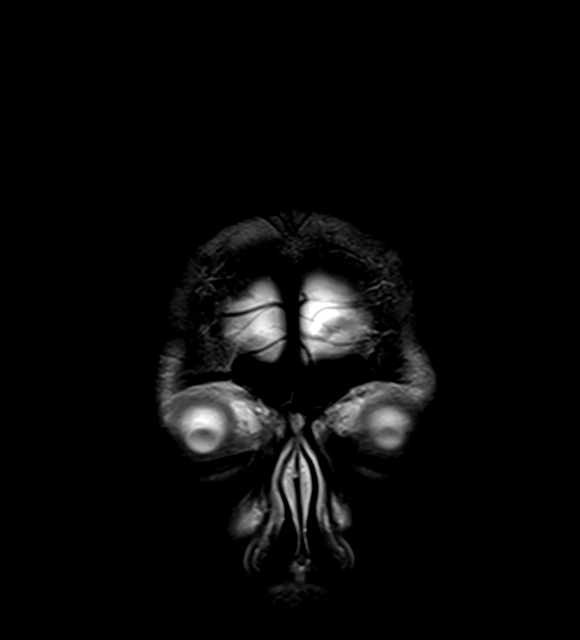
[im 29/29]
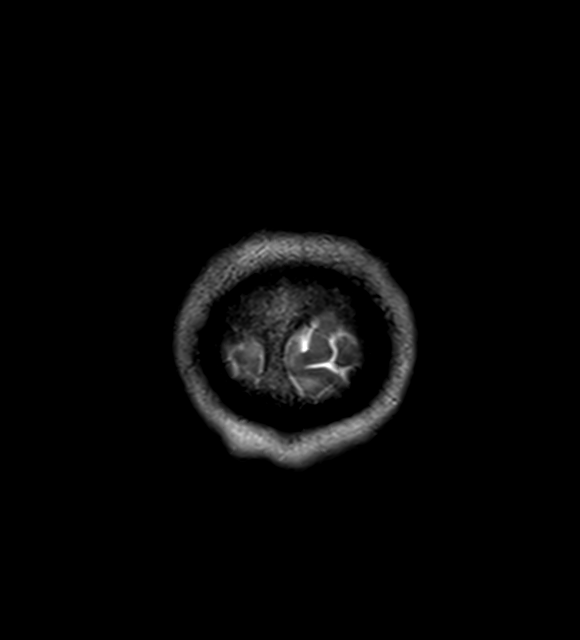

[44 of 48 positions shown; findings below may reference images not displayed]

FINDINGS: MRI HEAD FINDINGS

Brain: No acute infarct, mass effect or extra-axial collection. No
acute or chronic hemorrhage. Normal white matter signal. Generalized
volume loss without a clear lobar predilection. The midline
structures are normal.

Vascular: Major flow voids are preserved.

Skull and upper cervical spine: Normal calvarium and skull base.
Visualized upper cervical spine and soft tissues are normal.

Sinuses/Orbits:No paranasal sinus fluid levels or advanced mucosal
thickening. Right mastoid fluid. Normal orbits.

MRA HEAD FINDINGS

POSTERIOR CIRCULATION:

--Vertebral arteries: Normal

--Inferior cerebellar arteries: Normal.

--Basilar artery: Normal.

--Superior cerebellar arteries: Normal.

--Posterior cerebral arteries: Normal.

ANTERIOR CIRCULATION:

--Intracranial internal carotid arteries: Normal.

--Anterior cerebral arteries (ACA): Normal.

--Middle cerebral arteries (MCA): Normal.

ANATOMIC VARIANTS: None
IMPRESSION: 1. No acute intracranial abnormality.
2. Generalized volume loss without a clear lobar predilection.
3. Normal intracranial MRA.

## 2022-10-23 ENCOUNTER — Ambulatory Visit: Payer: Self-pay | Admitting: *Deleted

## 2022-10-23 DIAGNOSIS — M069 Rheumatoid arthritis, unspecified: Secondary | ICD-10-CM | POA: Diagnosis not present

## 2022-10-23 DIAGNOSIS — Z79899 Other long term (current) drug therapy: Secondary | ICD-10-CM | POA: Diagnosis not present

## 2022-10-23 DIAGNOSIS — K219 Gastro-esophageal reflux disease without esophagitis: Secondary | ICD-10-CM | POA: Diagnosis not present

## 2022-10-23 DIAGNOSIS — M1990 Unspecified osteoarthritis, unspecified site: Secondary | ICD-10-CM | POA: Diagnosis not present

## 2022-10-23 DIAGNOSIS — M8580 Other specified disorders of bone density and structure, unspecified site: Secondary | ICD-10-CM | POA: Diagnosis not present

## 2022-10-23 NOTE — Patient Instructions (Signed)
Visit Information  Thank you for taking time to visit with me today. Please don't hesitate to contact me if I can be of assistance to you.   Following are the goals we discussed today:   Goals Addressed             This Visit's Progress    Health and Mental Health Wellness       Activities and task to complete in order to accomplish goals.   Continue to review options and consider/contact for personal counseling support, exercise programs ([employer], [employer], etc) Use the [employer] website to filter options and call to inquire and confirm insurance and availability Call Customer Service on your insurance cards to check benefits (copay) for outpatient mental health counseling as well as to request in-network providers' list if needed Self Support options  (focus on the positive/positive self-talk, consider counseling (for depression, hoarding, etc as well as grief counseling) Consider setting a goal as discussed for purging things not needed Consider completion (sign in front of witnesses and Notary)the Advance Directives packet Glad you have been traveling, volunteering, etc  Enjoy upcoming family reunion in [place]!          Our next appointment is by telephone on  12/04/22 at 10am  Please call the care guide team at [phone] if you need to cancel or reschedule your appointment.   If you are experiencing a Mental Health or Behavioral Health Crisis or need someone to talk to, please call the Suicide and Crisis Lifeline: 988 call 911   The patient verbalized understanding of instructions, educational materials, and care plan provided today and DECLINED offer to receive copy of patient instructions, educational materials, and care plan.   Telephone follow up appointment with care management team member scheduled for:12/04/22  Reece Levy, MSW, LCSW Clinical Social Worker [facility] [phone]

## 2022-10-23 NOTE — Patient Outreach (Signed)
  Care Coordination   Follow Up Visit Note   10/23/2022 Name: Kendra Taylor MRN: [id number] DOB: 05/16/1945  Kendra Taylor is a 77 y.o. year old female who sees Merri Brunette, MD for primary care. I spoke with  Kristopher Glee Skoog by phone today.  What matters to the patients health and wellness today?  Doing well- have been travelling, seeing friends and enjoying the "longer days of sunlight"    Goals Addressed             This Visit's Progress    Health and Mental Health Wellness       Activities and task to complete in order to accomplish goals.   Continue to review options and consider/contact for personal counseling support, exercise programs ([employer], [employer], etc) Use the [employer] Today website to filter options and call to inquire and confirm insurance and availability Call Customer Service on your insurance cards to check benefits (copay) for outpatient mental health counseling as well as to request in-network providers' list if needed Self Support options  (focus on the positive/positive self-talk, consider counseling (for depression, hoarding, etc as well as grief counseling) Consider setting a goal as discussed for purging things not needed Consider completion (sign in front of witnesses and Notary)the Advance Directives packet Glad you have been traveling, volunteering, etc  Enjoy upcoming family reunion in [place]!          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 Visit Completed

## 2022-10-30 DIAGNOSIS — W57XXXA Bitten or stung by nonvenomous insect and other nonvenomous arthropods, initial encounter: Secondary | ICD-10-CM | POA: Diagnosis not present

## 2022-10-30 DIAGNOSIS — D649 Anemia, unspecified: Secondary | ICD-10-CM | POA: Diagnosis not present

## 2022-10-30 DIAGNOSIS — M0589 Other rheumatoid arthritis with rheumatoid factor of multiple sites: Secondary | ICD-10-CM | POA: Diagnosis not present

## 2022-10-30 DIAGNOSIS — S90561A Insect bite (nonvenomous), right ankle, initial encounter: Secondary | ICD-10-CM | POA: Diagnosis not present

## 2022-10-30 DIAGNOSIS — M8580 Other specified disorders of bone density and structure, unspecified site: Secondary | ICD-10-CM | POA: Diagnosis not present

## 2022-10-30 DIAGNOSIS — K219 Gastro-esophageal reflux disease without esophagitis: Secondary | ICD-10-CM | POA: Diagnosis not present

## 2022-10-30 DIAGNOSIS — F423 Hoarding disorder: Secondary | ICD-10-CM | POA: Diagnosis not present

## 2022-10-30 DIAGNOSIS — J309 Allergic rhinitis, unspecified: Secondary | ICD-10-CM | POA: Diagnosis not present

## 2022-10-30 DIAGNOSIS — G43909 Migraine, unspecified, not intractable, without status migrainosus: Secondary | ICD-10-CM | POA: Diagnosis not present

## 2022-10-30 DIAGNOSIS — Z0000 Encounter for general adult medical examination without abnormal findings: Secondary | ICD-10-CM | POA: Diagnosis not present

## 2022-10-30 DIAGNOSIS — G4733 Obstructive sleep apnea (adult) (pediatric): Secondary | ICD-10-CM | POA: Diagnosis not present

## 2022-10-30 DIAGNOSIS — E7800 Pure hypercholesterolemia, unspecified: Secondary | ICD-10-CM | POA: Diagnosis not present

## 2022-11-07 ENCOUNTER — Encounter: Payer: Self-pay | Admitting: Internal Medicine

## 2022-11-11 ENCOUNTER — Telehealth: Payer: Self-pay | Admitting: Physician Assistant

## 2022-11-11 NOTE — Telephone Encounter (Signed)
Called twice; unable to leave message regarding next upcoming appointment due to voicemail box being full

## 2022-11-28 ENCOUNTER — Other Ambulatory Visit: Payer: Self-pay

## 2022-11-28 DIAGNOSIS — C3492 Malignant neoplasm of unspecified part of left bronchus or lung: Secondary | ICD-10-CM

## 2022-11-28 DIAGNOSIS — C3490 Malignant neoplasm of unspecified part of unspecified bronchus or lung: Secondary | ICD-10-CM

## 2022-12-02 ENCOUNTER — Inpatient Hospital Stay: Payer: Medicare Other | Admitting: Physician Assistant

## 2022-12-02 ENCOUNTER — Inpatient Hospital Stay: Payer: Medicare Other

## 2022-12-05 ENCOUNTER — Encounter: Payer: Self-pay | Admitting: *Deleted

## 2022-12-06 ENCOUNTER — Telehealth: Payer: Self-pay | Admitting: *Deleted

## 2022-12-06 NOTE — Patient Outreach (Signed)
  Care Coordination   12/06/2022 Name: Kendra Taylor MRN: [id number] DOB: 1945-08-12   Care Coordination Outreach Attempts:  An unsuccessful telephone outreach was attempted today to offer the patient information about available care coordination services.  Follow Up Plan:  Additional outreach attempts will be made to offer the patient care coordination information and services.   Encounter Outcome:  No Answer   Care Coordination Interventions:  No, not indicated    Reece Levy, MSW, LCSW Clinical Social Worker [facility] [phone]

## 2022-12-17 DIAGNOSIS — K219 Gastro-esophageal reflux disease without esophagitis: Secondary | ICD-10-CM | POA: Diagnosis not present

## 2022-12-17 DIAGNOSIS — Z931 Gastrostomy status: Secondary | ICD-10-CM | POA: Diagnosis not present

## 2022-12-17 DIAGNOSIS — K317 Polyp of stomach and duodenum: Secondary | ICD-10-CM | POA: Diagnosis not present

## 2022-12-17 DIAGNOSIS — G4733 Obstructive sleep apnea (adult) (pediatric): Secondary | ICD-10-CM | POA: Diagnosis not present

## 2022-12-17 DIAGNOSIS — Z980 Intestinal bypass and anastomosis status: Secondary | ICD-10-CM | POA: Diagnosis not present

## 2022-12-17 DIAGNOSIS — D132 Benign neoplasm of duodenum: Secondary | ICD-10-CM | POA: Diagnosis not present

## 2022-12-17 DIAGNOSIS — Z7952 Long term (current) use of systemic steroids: Secondary | ICD-10-CM | POA: Diagnosis not present

## 2022-12-17 DIAGNOSIS — Z85118 Personal history of other malignant neoplasm of bronchus and lung: Secondary | ICD-10-CM | POA: Diagnosis not present

## 2022-12-17 DIAGNOSIS — Z79899 Other long term (current) drug therapy: Secondary | ICD-10-CM | POA: Diagnosis not present

## 2023-01-10 ENCOUNTER — Ambulatory Visit: Payer: Self-pay | Admitting: *Deleted

## 2023-01-13 NOTE — Patient Outreach (Signed)
Care Coordination   Follow Up Visit Note Late Entry 01/10/23  01/13/2023 Name: Allysa Hunt MRN: [id number] DOB: 02/19/1946  Tomeshia Nissley is a 77 y.o. year old female who sees Merri Brunette, MD for primary care. I spoke with  Kristopher Glee Bastedo by phone today.  What matters to the patients health and wellness today?  Continue to work on mental health wellness    Goals Addressed             This Visit's Progress    Health and Mental Health Wellness       Activities and task to complete in order to accomplish goals.    Call Customer Service on your insurance cards to check benefits (copay) for outpatient mental health counseling as well as to request in-network providers' list if needed Self Support options  (seek positive thoughts and activities- focus on the positive/positive self-talk, consider counseling (for depression, hoarding, etc as well as grief counseling) Consider setting a goal as discussed for purging things not needed Consider completion (sign in front of witnesses and Notary)the Advance Directives packet Glad you have been traveling, volunteering, etc  Consider therapeutic counseling support- including horse therapy          SDOH assessments and interventions completed:  Yes     Care Coordination Interventions:  Yes, provided  Interventions Today    Flowsheet Row Most Recent Value  General Interventions   General Interventions Discussed/Reviewed Community Resources  Mental Health Interventions   Mental Health Discussed/Reviewed --  [Pt shared with CSW struggles with wanting the "old normal" and accepting life changes w/medical/physical and age-related.Pt able to reflect and considering "horse therapy" to enhance her mental health]       Follow up plan: Follow up call scheduled for 10/25    Encounter Outcome:  Patient Visit Completed

## 2023-01-13 NOTE — Patient Instructions (Signed)
Visit Information  Thank you for taking time to visit with me today. Please don't hesitate to contact me if I can be of assistance to you.   Following are the goals we discussed today:   Goals Addressed             This Visit's Progress    Health and Mental Health Wellness       Activities and task to complete in order to accomplish goals.    Call Customer Service on your insurance cards to check benefits (copay) for outpatient mental health counseling as well as to request in-network providers' list if needed Self Support options  (seek positive thoughts and activities- focus on the positive/positive self-talk, consider counseling (for depression, hoarding, etc as well as grief counseling) Consider setting a goal as discussed for purging things not needed Consider completion (sign in front of witnesses and Notary)the Advance Directives packet Glad you have been traveling, volunteering, etc  Consider therapeutic counseling support- including horse therapy          Our next appointment is by telephone on 02/21/23 Please call the care guide team at [phone] if you need to cancel or reschedule your appointment.   If you are experiencing a Mental Health or Behavioral Health Crisis or need someone to talk to, please call the Suicide and Crisis Lifeline: 988 call 911   The patient verbalized understanding of instructions, educational materials, and care plan provided today and DECLINED offer to receive copy of patient instructions, educational materials, and care plan.   Telephone follow up appointment with care management team member scheduled for:02/21/23  Reece Levy, MSW, LCSW Clinical Social Worker [phone]

## 2023-01-22 DIAGNOSIS — D72829 Elevated white blood cell count, unspecified: Secondary | ICD-10-CM | POA: Diagnosis not present

## 2023-01-29 DIAGNOSIS — M79671 Pain in right foot: Secondary | ICD-10-CM | POA: Diagnosis not present

## 2023-01-29 DIAGNOSIS — M1990 Unspecified osteoarthritis, unspecified site: Secondary | ICD-10-CM | POA: Diagnosis not present

## 2023-01-29 DIAGNOSIS — K219 Gastro-esophageal reflux disease without esophagitis: Secondary | ICD-10-CM | POA: Diagnosis not present

## 2023-01-29 DIAGNOSIS — M069 Rheumatoid arthritis, unspecified: Secondary | ICD-10-CM | POA: Diagnosis not present

## 2023-01-29 DIAGNOSIS — M8580 Other specified disorders of bone density and structure, unspecified site: Secondary | ICD-10-CM | POA: Diagnosis not present

## 2023-01-29 DIAGNOSIS — Z79899 Other long term (current) drug therapy: Secondary | ICD-10-CM | POA: Diagnosis not present

## 2023-01-29 DIAGNOSIS — M79643 Pain in unspecified hand: Secondary | ICD-10-CM | POA: Diagnosis not present

## 2023-01-29 DIAGNOSIS — Z23 Encounter for immunization: Secondary | ICD-10-CM | POA: Diagnosis not present

## 2023-02-21 ENCOUNTER — Ambulatory Visit: Payer: Self-pay | Admitting: *Deleted

## 2023-02-21 NOTE — Patient Instructions (Signed)
Visit Information  Thank you for taking time to visit with me today. Please don't hesitate to contact me if I can be of assistance to you.   Following are the goals we discussed today:   Goals Addressed             This Visit's Progress    Health and Mental Health Wellness       Activities and task to complete in order to accomplish goals.    Consider therapeutic counseling support and the Horse Therapy you mentioned as an interest- reach out to them for inquiry/visit Review material to be emailed to you for review Call Customer Service on your insurance cards to check benefits (copay) for outpatient mental health counseling as well as to request in-network providers' list if needed Self Support options  (seek positive thoughts and activities- focus on the positive/positive self-talk, consider counseling (for depression, hoarding, etc as well as grief counseling) Consider setting a goal as discussed for purging things not needed Consider completion (sign in front of witnesses and Notary)the Advance Directives packet Glad you have been traveling, volunteering, etc             Our next appointment is by telephone on 03/12/23  Please call the care guide team at [phone] if you need to cancel or reschedule your appointment.   If you are experiencing a Mental Health or Behavioral Health Crisis or need someone to talk to, please call the Suicide and Crisis Lifeline: 988 call 911   The patient verbalized understanding of instructions, educational materials, and care plan provided today and DECLINED offer to receive copy of patient instructions, educational materials, and care plan.   Telephone follow up appointment with care management team member scheduled for:03/12/23  Reece Levy, MSW, LCSW [facility]  [facility], [facility] Licensed Clinical Social Worker Care Coordinator  [phone]

## 2023-02-21 NOTE — Patient Outreach (Signed)
Care Coordination   Follow Up Visit Note   02/21/2023 Name: Kendra Taylor MRN: [id number] DOB: 28-Sep-1945  Kendra Taylor is a 77 y.o. year old female who sees Merri Brunette, MD for primary care. I spoke with  Kendra Taylor by phone today.  What matters to the patients health and wellness today?  Pt admits to feeling worse today- depression, procrastination on tasks/projects and overall.    Goals Addressed             This Visit's Progress    Health and Mental Health Wellness       Activities and task to complete in order to accomplish goals.    Consider therapeutic counseling support and the Horse Therapy you mentioned as an interest- reach out to them for inquiry/visit Review material to be emailed to you for review Call Customer Service on your insurance cards to check benefits (copay) for outpatient mental health counseling as well as to request in-network providers' list if needed Self Support options  (seek positive thoughts and activities- focus on the positive/positive self-talk, consider counseling (for depression, hoarding, etc as well as grief counseling) Consider setting a goal as discussed for purging things not needed Consider completion (sign in front of witnesses and Notary)the Advance Directives packet Glad you have been traveling, volunteering, etc             SDOH assessments and interventions completed:  Yes  SDOH Interventions Today    Flowsheet Row Most Recent Value  SDOH Interventions   Depression Interventions/Treatment  Referral to Psychiatry, Medication, Counseling (P)         Care Coordination Interventions:  Yes, provided  Interventions Today    Flowsheet Row Most Recent Value  Chronic Disease   Chronic disease during today's visit Other  [depression]  General Interventions   General Interventions Discussed/Reviewed General Interventions Discussed, General Interventions Reviewed, Community Resources  [Pt  encouraged to consider community resources/activities]  Mental Health Interventions   Mental Health Discussed/Reviewed Mental Health Discussed, Mental Health Reviewed, Coping Strategies, Depression, Other  [Pt shared with CSW today her depression has seemed worse. CSW completed PHQ9 Depression Screening with pt today- scoring "20" which is up/worsened from "6" in March. Discussed and encouraged reaching out to seeking on-going therapy,  Horse Therapy maybe]  Safety Interventions   Safety Discussed/Reviewed Safety Discussed, Home Safety  [Reminded pt of 24/7 hotline to call for behavioral health support by calling "988". Pt denies SI]         02/21/2023   10:14 AM 07/01/2022   12:30 PM 09/29/2017   11:55 AM 04/21/2017    9:35 AM  Depression screen PHQ 2/9  Decreased Interest 3 1 0 0  Down, Depressed, Hopeless 2 0 0 0  PHQ - 2 Score 5 1 0 0  Altered sleeping 3 0    Tired, decreased energy 2 1    Change in appetite 3 0    Feeling bad or failure about yourself  3 2    Trouble concentrating 3 3    Moving slowly or fidgety/restless 1 1    Suicidal thoughts 0 0    PHQ-9 Score 20 8    Difficult doing work/chores  Somewhat difficult      Follow up plan: Follow up call scheduled for 03/12/23    Encounter Outcome:  Patient Visit Completed

## 2023-03-04 DIAGNOSIS — M0589 Other rheumatoid arthritis with rheumatoid factor of multiple sites: Secondary | ICD-10-CM | POA: Diagnosis not present

## 2023-03-12 ENCOUNTER — Ambulatory Visit: Payer: Self-pay | Admitting: *Deleted

## 2023-03-12 NOTE — Patient Instructions (Signed)
Visit Information  Thank you for taking time to visit with me today. Please don't hesitate to contact me if I can be of assistance to you.   Following are the goals we discussed today:   Goals Addressed             This Visit's Progress    Health and Mental Health Wellness       Activities and task to complete in order to accomplish goals.    Attend first visit with therapeutic counseling support and the Horse Therapy you mentioned as an interest- reach out to them for inquiry/visit Get ready for your Thanksgiving visitors Call Customer Service on your insurance cards to check benefits (copay) for outpatient mental health counseling as well as to request in-network providers' list if needed Self Support options  (seek positive thoughts and activities- focus on the positive/positive self-talk, consider counseling (for depression, hoarding, etc as well as grief counseling) Consider setting a goal as discussed for purging things not needed Consider completion (sign in front of witnesses and Notary) of Advance Directive packet  Glad you have been traveling, volunteering, etc             Our next appointment is by telephone on 04/02/23  Please call the care guide team at [phone] if you need to cancel or reschedule your appointment.   If you are experiencing a Mental Health or Behavioral Health Crisis or need someone to talk to, please call the Suicide and Crisis Lifeline: 988 call 911   The patient verbalized understanding of instructions, educational materials, and care plan provided today and DECLINED offer to receive copy of patient instructions, educational materials, and care plan.   Telephone follow up appointment with care management team member scheduled for:  Reece Levy, MSW, LCSW [facility], [facility] Licensed Clinical Social Worker Care Coordinator  [phone]

## 2023-03-12 NOTE — Patient Outreach (Signed)
Care Coordination   Follow Up Visit Note   03/12/2023 Name: Kendra Taylor MRN: [id number] DOB: [birth year]  Kendra Taylor is a 77 y.o. year old female who sees Kendra Brunette, MD for primary care. I spoke with  Kendra Taylor by phone today.  What matters to the patients health and wellness today?  Continue to work on goals and overall health and mental health wellness.   Goals Addressed             This Visit's Progress    Health and Mental Health Wellness       Activities and task to complete in order to accomplish goals.    Attend first visit with therapeutic counseling support and the Horse Therapy you mentioned as an interest- reach out to them for inquiry/visit Get ready for your Thanksgiving visitors Call Customer Service on your insurance cards to check benefits (copay) for outpatient mental health counseling as well as to request in-network providers' list if needed Self Support options  (seek positive thoughts and activities- focus on the positive/positive self-talk, consider counseling (for depression, hoarding, etc as well as grief counseling) Consider setting a goal as discussed for purging things not needed Consider completion (sign in front of witnesses and Notary) of Advance Directive packet  Glad you have been traveling, volunteering, etc             SDOH assessments and interventions completed:  Yes  SDOH Interventions Today    Flowsheet Row Most Recent Value  SDOH Interventions   Depression Interventions/Treatment  Counseling        Care Coordination Interventions:  Yes, provided  Interventions Today    Flowsheet Row Most Recent Value  Chronic Disease   Chronic disease during today's visit Other  [depression]  General Interventions   General Interventions Discussed/Reviewed [employer] is considering attending a non-profit horse therapy program for possible volunteer and other opportunities]  Mental Health  Interventions   Mental Health Discussed/Reviewed Mental Health Discussed, Depression, Mental Health Reviewed, Coping Strategies  [Pt reports "better" than last visit- validated pt's feelings around  "life changes" and encouraged her to consider counseling and therapeutic support it will provide.]  Nutrition Interventions   Nutrition Discussed/Reviewed Nutrition Discussed  [Pt reports she has gained some weight- up to 108 lbs]  Safety Interventions   Safety Discussed/Reviewed Safety Discussed   [Pt denies SI]  Advanced Directive Interventions   Advanced Directives Discussed/Reviewed Advanced Directives Discussed, Advanced Directives Reviewed  [Pt encouraged to complete her Advance Directive packet. She also wants to finalize her "burial spot" and other plans-  encouraged her to set a due date and complete this]       Follow up plan: Follow up call scheduled for 04/02/23    Encounter Outcome:  Patient Visit Completed       03/12/2023   10:17 AM 02/21/2023   10:14 AM 07/01/2022   12:30 PM 09/29/2017   11:55 AM 04/21/2017    9:35 AM  Depression screen PHQ 2/9  Decreased Interest 1 3 1  0 0  Down, Depressed, Hopeless 1 2 0 0 0  PHQ - 2 Score 2 5 1  0 0  Altered sleeping 0 3 0    Tired, decreased energy 0 2 1    Change in appetite 0 3 0    Feeling bad or failure about yourself  1 3 2     Trouble concentrating 1 3 3     Moving slowly or fidgety/restless 1 1 1  Suicidal thoughts 0 0 0    PHQ-9 Score 5 20 8     Difficult doing work/chores Very difficult  Somewhat difficult       Kendra Taylor, MSW, LCSW [facility]  [facility], [facility] Licensed Clinical Social Worker Care Coordinator  [phone]

## 2023-03-20 DIAGNOSIS — M0589 Other rheumatoid arthritis with rheumatoid factor of multiple sites: Secondary | ICD-10-CM | POA: Diagnosis not present

## 2023-04-02 ENCOUNTER — Ambulatory Visit: Payer: Self-pay | Admitting: *Deleted

## 2023-04-02 NOTE — Patient Instructions (Signed)
Visit Information  Thank you for taking time to visit with me today. Please don't hesitate to contact me if I can be of assistance to you.   Following are the goals we discussed today:   Goals Addressed             This Visit's Progress    Health and Mental Health Wellness       Activities and task to complete in order to accomplish goals.    Attend next therapeutic counseling session and consider connecting with volunteering at [employer] as mentioned as an interest- reach out to them for inquiry/visit Call Customer Service on your insurance cards to check benefits (copay) for outpatient mental health counseling as well as to request in-network providers' list if needed Self Support options  (seek positive thoughts and activities- focus on the positive/positive self-talk, consider counseling (for depression, hoarding, etc as well as grief counseling, set personal goals for achieving task to clean desk ex: committing to 5 minutes per day of time spent ) Consider setting a goal as discussed for purging things not needed Consider completion (sign in front of witnesses and Notary) of Advance Directive packet  Glad you have been traveling, volunteering, etc             Our next appointment is by telephone on 05/13/23 9am  Please call the care guide team at [phone] if you need to cancel or reschedule your appointment.   If you are experiencing a Mental Health or Behavioral Health Crisis or need someone to talk to, please call the Suicide and Crisis Lifeline: 988 call 911   Patient verbalizes understanding of instructions and care plan provided today and agrees to view in MyChart. Active MyChart status and patient understanding of how to access instructions and care plan via MyChart confirmed with patient.     Telephone follow up appointment with care management team member scheduled for: 05/13/23 with Kenton Kingfisher, LCSW.  Reece Levy, MSW, LCSW [place]  [facility], [facility] Licensed Clinical Social Worker Care Coordinator  [phone]

## 2023-04-02 NOTE — Patient Outreach (Signed)
  Care Coordination   Follow Up Visit Note   04/02/2023 Name: Kendra Taylor MRN: [id number] DOB: 01-28-1946  Kendra Taylor is a 77 y.o. year old female who sees Kendra Brunette, MD for primary care. I spoke with  Kendra Taylor by phone today.  What matters to the patients health and wellness today?  Caregiver stress along with implementing hoarding interventions.   Goals Addressed             This Visit's Progress    Health and Mental Health Wellness       Activities and task to complete in order to accomplish goals.    Attend next therapeutic counseling session and consider connecting with volunteering at [employer] as mentioned as an interest- reach out to them for inquiry/visit Call Customer Service on your insurance cards to check benefits (copay) for outpatient mental health counseling as well as to request in-network providers' list if needed Self Support options  (seek positive thoughts and activities- focus on the positive/positive self-talk, consider counseling (for depression, hoarding, etc as well as grief counseling, set personal goals for achieving task to clean desk ex: committing to 5 minutes per day of time spent ) Consider setting a goal as discussed for purging things not needed Consider completion (sign in front of witnesses and Notary) of Advance Directive packet  Glad you have been traveling, volunteering, etc             SDOH assessments and interventions completed:  Yes  SDOH Interventions Today    Flowsheet Row Most Recent Value  SDOH Interventions   Housing Interventions Intervention Not Indicated  Health Literacy Interventions Intervention Not Indicated        Care Coordination Interventions:  Yes, provided  Interventions Today    Flowsheet Row Most Recent Value  Chronic Disease   Chronic disease during today's visit Other  [depression/hoarding]  General Interventions   General Interventions Discussed/Reviewed  [employer]  [CSW suggested pt may consider attending a caregiver support group to aide in her coping with family decline.]  Education Interventions   Education Provided Provided Education  Provided Verbal Education On Mental Health/Coping with Illness, [employer]  [Discussed small goals to work towards with home tasks.]  Mental Health Interventions   Mental Health Discussed/Reviewed Mental Health Discussed, Coping Strategies, Mental Health Reviewed, Other  [Pt shared her recent feelings of being overwhelmed and "brain overwhelmed" with caregiving, home projects and accpeptance of "turning 78". CSW validated pt's feelings and offered support and coping/goals]       Follow up plan:   Follow up call scheduled for   05/13/23 Encounter Outcome:  Patient Visit Completed

## 2023-05-13 ENCOUNTER — Ambulatory Visit: Payer: Self-pay | Admitting: Licensed Clinical Social Worker

## 2023-05-13 ENCOUNTER — Encounter: Payer: MEDICARE | Admitting: *Deleted

## 2023-05-13 NOTE — Patient Instructions (Signed)
 Visit Information  Thank you for taking time to visit with me today. Please don't hesitate to contact me if I can be of assistance to you.   Following are the goals we discussed today:   Goals Addressed             This Visit's Progress    Health and Mental Health Wellness       Activities and task to complete in order to accomplish goals.    Attend next therapeutic counseling session and consider connecting with volunteering at [employer] as mentioned as an interest- reach out to them for inquiry/visit Call Customer Service on your insurance cards to check benefits (copay) for outpatient mental health counseling as well as to request in-network providers' list if needed Self Support options  (seek positive thoughts and activities- focus on the positive/positive self-talk, consider counseling (for depression, hoarding, etc as well as grief counseling, set personal goals for achieving task to clean desk ex: committing to 5 minutes per day of time spent ) Consider setting a goal as discussed for purging things not needed Consider completion (sign in front of witnesses and Notary) of Advance Directive packet  Glad you have been traveling, volunteering, etc   Continue seeing therapist in [facility], working on procrastination, caregiver burnout and wanting to get out more Speak to family about coming to her for birthday party Review caregiver support groups sent by e-mail          Our next appointment is by telephone on 06/13/23 at 9AM  Please call the care guide team at [phone] if you need to cancel or reschedule your appointment.   If you are experiencing a Mental Health or Behavioral Health Crisis or need someone to talk to, please call the Suicide and Crisis Lifeline: 988  Patient verbalizes understanding of instructions and care plan provided today and agrees to view in MyChart. Active MyChart status and patient understanding of how to access instructions and care plan via  MyChart confirmed with patient.     Telephone follow up appointment with care management team member scheduled for: 06/13/23  Alm Armor, LCSW [facility], [facility] Licensed Clinical Social Worker Care Coordinator [phone]

## 2023-05-13 NOTE — Patient Outreach (Signed)
  Care Coordination   Follow Up Visit Note   05/13/2023 Name: Kendra Taylor MRN: [id number] DOB: [birth year]  Kendra Taylor is a 78 y.o. year old female who sees Clarice Nottingham, MD for primary care. I spoke with  Randie Darice Kamps by phone today.  What matters to the patients health and wellness today?  Managing my caregiver stress and feelings of grief/loss.    Goals Addressed             This Visit's Progress    Health and Mental Health Wellness       Activities and task to complete in order to accomplish goals.    Attend next therapeutic counseling session and consider connecting with volunteering at [employer] as mentioned as an interest- reach out to them for inquiry/visit Call Customer Service on your insurance cards to check benefits (copay) for outpatient mental health counseling as well as to request in-network providers' list if needed Self Support options  (seek positive thoughts and activities- focus on the positive/positive self-talk, consider counseling (for depression, hoarding, etc as well as grief counseling, set personal goals for achieving task to clean desk ex: committing to 5 minutes per day of time spent ) Consider setting a goal as discussed for purging things not needed Consider completion (sign in front of witnesses and Notary) of Advance Directive packet  Glad you have been traveling, volunteering, etc   Continue seeing therapist in [facility], working on procrastination, caregiver burnout and wanting to get out more Speak to family about coming to her for birthday party Review caregiver support groups sent by e-mail          SDOH assessments and interventions completed:  Yes     Care Coordination Interventions:  Yes, provided   Interventions Today    Flowsheet Row Most Recent Value  Chronic Disease   Chronic disease during today's visit Other  [Caregiver Support]  General Interventions   General Interventions  Discussed/Reviewed [facility], General Interventions Discussed  [CSW and pt discussed caregiver stress, pt is interested in caregiver support groups in the area. CSW was able to lookup resources for support groups in the area and sent those by e-mail. Pt agreed to review those.]  Mental Health Interventions   Mental Health Discussed/Reviewed Mental Health Discussed, Mental Health Reviewed, Coping Strategies, Anxiety, Depression  [Pt is seeing therapist in [place] two weeks, pt reports this has been very helpful for her mood. Working on caregiver stress, proctrastination and grief/loss. We discussed her focusing on positive moments w/spouse.]        Follow up plan: Follow up call scheduled for 06/13/2023    Encounter Outcome:  Patient Visit Completed   Alm Armor, LCSW [place]/[facility], [facility] Licensed Clinical Social Worker Care Coordinator [phone]

## 2023-06-13 ENCOUNTER — Encounter: Payer: Self-pay | Admitting: Licensed Clinical Social Worker

## 2023-06-16 ENCOUNTER — Ambulatory Visit: Payer: Self-pay | Admitting: Licensed Clinical Social Worker

## 2023-06-16 NOTE — Patient Instructions (Signed)
Visit Information  Thank you for taking time to visit with me today. Please don't hesitate to contact me if I can be of assistance to you.   Following are the goals we discussed today:   Goals Addressed             This Visit's Progress    Health and Mental Health Wellness       Activities and task to complete in order to accomplish goals.    Attend next therapeutic counseling session and consider connecting with volunteering at [employer] as mentioned as an interest- reach out to them for inquiry/visit Call Customer Service on your insurance cards to check benefits (copay) for outpatient mental health counseling as well as to request in-network providers' list if needed Self Support options  (seek positive thoughts and activities- focus on the positive/positive self-talk, consider counseling (for depression, hoarding, etc as well as grief counseling, set personal goals for achieving task to clean desk ex: committing to 5 minutes per day of time spent ) Consider setting a goal as discussed for purging things not needed Consider completion (sign in front of witnesses and Notary) of Advance Directive packet  Glad you have been traveling, volunteering, etc   Continue seeing therapist in [facility], working on procrastination, caregiver burnout and wanting to get out more Speak to family about coming to her for birthday party Review caregiver support groups sent by e-mail Review behavioral activation resources sent by e-mail. Continue monitoring mood and utilizing coping skills as needed          Our next appointment is by telephone on 07/14/2023 at 9:30AM  Please call the care guide team at [phone] if you need to cancel or reschedule your appointment.   If you are experiencing a Mental Health or Behavioral Health Crisis or need someone to talk to, please call the Suicide and Crisis Lifeline: 988  Patient verbalizes understanding of instructions and care plan provided today  and agrees to view in MyChart. Active MyChart status and patient understanding of how to access instructions and care plan via MyChart confirmed with patient.     Telephone follow up appointment with care management team member scheduled for: 07/14/2023

## 2023-06-16 NOTE — Patient Outreach (Signed)
  Care Coordination   Follow Up Visit Note   06/16/2023 Name: Kendra Taylor MRN: [id number] DOB: 23-May-1945  Kendra Taylor is a 78 y.o. year old female who sees Merri Brunette, MD for primary care. I spoke with  Kristopher Glee Hellmann by phone today.  What matters to the patients health and wellness today?  Better managing MH symptoms.    Goals Addressed             This Visit's Progress    Health and Mental Health Wellness       Activities and task to complete in order to accomplish goals.    Attend next therapeutic counseling session and consider connecting with volunteering at [employer] as mentioned as an interest- reach out to them for inquiry/visit Call Customer Service on your insurance cards to check benefits (copay) for outpatient mental health counseling as well as to request in-network providers' list if needed Self Support options  (seek positive thoughts and activities- focus on the positive/positive self-talk, consider counseling (for depression, hoarding, etc as well as grief counseling, set personal goals for achieving task to clean desk ex: committing to 5 minutes per day of time spent ) Consider setting a goal as discussed for purging things not needed Consider completion (sign in front of witnesses and Notary) of Advance Directive packet  Glad you have been traveling, volunteering, etc   Continue seeing therapist in [facility], working on procrastination, caregiver burnout and wanting to get out more Speak to family about coming to her for birthday party Review caregiver support groups sent by e-mail Review behavioral activation resources sent by e-mail. Continue monitoring mood and utilizing coping skills as needed          SDOH assessments and interventions completed:  Yes  SDOH Interventions Today    Flowsheet Row Most Recent Value  SDOH Interventions   Depression Interventions/Treatment  Counseling        Care Coordination  Interventions:  Yes, provided   Interventions Today    Flowsheet Row Most Recent Value  Chronic Disease   Chronic disease during today's visit Other  [Depression]  Mental Health Interventions   Mental Health Discussed/Reviewed Mental Health Discussed, Mental Health Reviewed, Depression, Coping Strategies  [Pt and  reviewd her recent MH symptoms - pt stated she continues to feel grief/frustration 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 We discussed being open with spouse regarding her struggles and leaning on her supports. Positive mantras and outlets.]        Follow up plan: Follow up call scheduled for 07/14/2023    Encounter Outcome:  Patient Visit Completed   Kenton Kingfisher, LCSW [place]/[facility], [facility] Licensed Clinical Social Worker Care Coordinator [phone]

## 2023-06-18 ENCOUNTER — Telehealth: Payer: Self-pay | Admitting: Internal Medicine

## 2023-06-26 ENCOUNTER — Other Ambulatory Visit: Payer: MEDICARE

## 2023-06-26 ENCOUNTER — Ambulatory Visit (HOSPITAL_COMMUNITY)
Admission: RE | Admit: 2023-06-26 | Discharge: 2023-06-26 | Disposition: A | Discharge status: Home / Self Care | Payer: MEDICARE | Source: Ambulatory Visit | Attending: Internal Medicine | Admitting: Internal Medicine

## 2023-06-26 ENCOUNTER — Inpatient Hospital Stay: Payer: MEDICARE | Attending: Internal Medicine

## 2023-06-26 DIAGNOSIS — Z85118 Personal history of other malignant neoplasm of bronchus and lung: Secondary | ICD-10-CM | POA: Diagnosis not present

## 2023-06-26 DIAGNOSIS — C3490 Malignant neoplasm of unspecified part of unspecified bronchus or lung: Secondary | ICD-10-CM

## 2023-06-26 DIAGNOSIS — C3432 Malignant neoplasm of lower lobe, left bronchus or lung: Secondary | ICD-10-CM | POA: Insufficient documentation

## 2023-06-26 DIAGNOSIS — C3492 Malignant neoplasm of unspecified part of left bronchus or lung: Secondary | ICD-10-CM

## 2023-06-26 DIAGNOSIS — J90 Pleural effusion, not elsewhere classified: Secondary | ICD-10-CM | POA: Diagnosis not present

## 2023-06-26 DIAGNOSIS — J984 Other disorders of lung: Secondary | ICD-10-CM | POA: Diagnosis not present

## 2023-06-26 LAB — CBC WITH DIFFERENTIAL (CANCER CENTER ONLY)
Abs Immature Granulocytes: 0.03 10*3/uL (ref 0.00–0.07)
Basophils Absolute: 0.1 10*3/uL (ref 0.0–0.1)
Basophils Relative: 1 %
Eosinophils Absolute: 0.2 10*3/uL (ref 0.0–0.5)
Eosinophils Relative: 2 %
HCT: 42.8 % (ref 36.0–46.0)
Hemoglobin: 13.9 g/dL (ref 12.0–15.0)
Immature Granulocytes: 0 %
Lymphocytes Relative: 11 %
Lymphs Abs: 1.1 10*3/uL (ref 0.7–4.0)
MCH: 28.2 pg (ref 26.0–34.0)
MCHC: 32.5 g/dL (ref 30.0–36.0)
MCV: 86.8 fL (ref 80.0–100.0)
Monocytes Absolute: 0.9 10*3/uL (ref 0.1–1.0)
Monocytes Relative: 9 %
Neutro Abs: 7.6 10*3/uL (ref 1.7–7.7)
Neutrophils Relative %: 77 %
Platelet Count: 221 10*3/uL (ref 150–400)
RBC: 4.93 MIL/uL (ref 3.87–5.11)
RDW: 13.5 % (ref 11.5–15.5)
WBC Count: 9.9 10*3/uL (ref 4.0–10.5)
nRBC: 0 % (ref 0.0–0.2)

## 2023-06-26 LAB — CMP (CANCER CENTER ONLY)
ALT: 16 U/L (ref 0–44)
AST: 23 U/L (ref 15–41)
Albumin: 3.7 g/dL (ref 3.5–5.0)
Alkaline Phosphatase: 70 U/L (ref 38–126)
Anion gap: 5 (ref 5–15)
BUN: 18 mg/dL (ref 8–23)
CO2: 36 mmol/L — ABNORMAL HIGH (ref 22–32)
Calcium: 9.2 mg/dL (ref 8.9–10.3)
Chloride: 103 mmol/L (ref 98–111)
Creatinine: 0.73 mg/dL (ref 0.44–1.00)
GFR, Estimated: >60 mL/min (ref >60)
Glucose, Bld: 85 mg/dL (ref 70–99)
Potassium: 3.9 mmol/L (ref 3.5–5.1)
Sodium: 144 mmol/L (ref 135–145)
Total Bilirubin: 0.9 mg/dL (ref 0.0–1.2)
Total Protein: 6.1 g/dL — ABNORMAL LOW (ref 6.5–8.1)

## 2023-06-26 MED ORDER — IOHEXOL 300 MG/ML  SOLN
75.0000 mL | Freq: Once | INTRAMUSCULAR | Status: AC | PRN
  Administered 2023-06-26: 75 mL via INTRAVENOUS

## 2023-06-27 DIAGNOSIS — K3189 Other diseases of stomach and duodenum: Secondary | ICD-10-CM | POA: Diagnosis not present

## 2023-06-27 DIAGNOSIS — Z434 Encounter for attention to other artificial openings of digestive tract: Secondary | ICD-10-CM | POA: Diagnosis not present

## 2023-06-27 DIAGNOSIS — D132 Benign neoplasm of duodenum: Secondary | ICD-10-CM | POA: Diagnosis not present

## 2023-06-27 DIAGNOSIS — Z903 Acquired absence of stomach [part of]: Secondary | ICD-10-CM | POA: Diagnosis not present

## 2023-06-27 DIAGNOSIS — Z8719 Personal history of other diseases of the digestive system: Secondary | ICD-10-CM | POA: Diagnosis not present

## 2023-06-27 DIAGNOSIS — Z09 Encounter for follow-up examination after completed treatment for conditions other than malignant neoplasm: Secondary | ICD-10-CM | POA: Diagnosis not present

## 2023-06-27 DIAGNOSIS — Z980 Intestinal bypass and anastomosis status: Secondary | ICD-10-CM | POA: Diagnosis not present

## 2023-06-27 DIAGNOSIS — K6389 Other specified diseases of intestine: Secondary | ICD-10-CM | POA: Diagnosis not present

## 2023-06-27 DIAGNOSIS — Z886 Allergy status to analgesic agent status: Secondary | ICD-10-CM | POA: Diagnosis not present

## 2023-06-30 ENCOUNTER — Inpatient Hospital Stay: Payer: MEDICARE | Attending: Internal Medicine | Admitting: Internal Medicine

## 2023-06-30 VITALS — BP 144/90 | HR 89 | Temp 98.0°F | Resp 17 | Wt 116.0 lb

## 2023-06-30 DIAGNOSIS — M1990 Unspecified osteoarthritis, unspecified site: Secondary | ICD-10-CM | POA: Diagnosis not present

## 2023-06-30 DIAGNOSIS — Z902 Acquired absence of lung [part of]: Secondary | ICD-10-CM | POA: Insufficient documentation

## 2023-06-30 DIAGNOSIS — Z08 Encounter for follow-up examination after completed treatment for malignant neoplasm: Secondary | ICD-10-CM | POA: Diagnosis not present

## 2023-06-30 DIAGNOSIS — Z85118 Personal history of other malignant neoplasm of bronchus and lung: Secondary | ICD-10-CM | POA: Diagnosis not present

## 2023-06-30 DIAGNOSIS — Z79899 Other long term (current) drug therapy: Secondary | ICD-10-CM | POA: Diagnosis not present

## 2023-06-30 DIAGNOSIS — C3490 Malignant neoplasm of unspecified part of unspecified bronchus or lung: Secondary | ICD-10-CM

## 2023-06-30 DIAGNOSIS — K219 Gastro-esophageal reflux disease without esophagitis: Secondary | ICD-10-CM | POA: Diagnosis not present

## 2023-06-30 DIAGNOSIS — M8580 Other specified disorders of bone density and structure, unspecified site: Secondary | ICD-10-CM | POA: Diagnosis not present

## 2023-06-30 DIAGNOSIS — M069 Rheumatoid arthritis, unspecified: Secondary | ICD-10-CM | POA: Diagnosis not present

## 2023-06-30 DIAGNOSIS — M79643 Pain in unspecified hand: Secondary | ICD-10-CM | POA: Diagnosis not present

## 2023-06-30 NOTE — Progress Notes (Signed)
 [facility] [phone]   [phone]  OFFICE PROGRESS NOTE  Merri Brunette, MD [address] [place] [address]  DIAGNOSIS:  Stage IA (T1c, N0, M0) non-small cell lung cancer, adenocarcinoma presented with left lower lobe lung mass   PRIOR THERAPY: Status post left lower lobectomy with lymph node sampling on May 24, 2021 under the care of Dr. Cliffton Asters.  CURRENT THERAPY: Observation  INTERVAL HISTORY: Kendra Taylor 78 y.o. female returns to the clinic today for follow-up visit accompanied by her spouse. Discussed the use of AI scribe software for clinical note transcription with the patient, who gave verbal consent to proceed.  History of Present Illness   Kendra Taylor is an 78 year old female with stage 1A non-small cell lung cancer who presents for follow-up after left lower lobectomy. She is accompanied by her spouse.  She was diagnosed with stage 1A non-small cell lung cancer adenocarcinoma in January 2023 and underwent a left lower lobectomy. Since the surgery, 11 lymph nodes were removed, showing no evidence of cancer, and no chemotherapy or radiation was required.  Approximately a year after the lung surgery, she developed a mass in her duodenum, described as 'unstable' with atypical cells. She underwent a surgical procedure where the stomach was 'shut off' and a new opening was created, with all ducts remaining intact and functional. A recent follow-up endoscopy showed satisfactory results, although she notes that her lip gets injured during the procedure.  She has been on acid reflux medication for life following her stomach surgery, currently taking both Pepcid and Protonix for acid control. Her arthritis medication has changed from biologics to rituximab.  No chest pain, shortness of breath, cough, nausea, vomiting, or diarrhea.        MEDICAL HISTORY: Past Medical History:  Diagnosis Date    Anemia    Anxiety    Arthritis    Atypical mole 01/08/2013   right inner forearm   Basal cell carcinoma 03/12/2017   right concha exc mohs   Depression    Dyspnea    GERD (gastroesophageal reflux disease)    HA (headache)    Migraine    Sleep apnea    cpap    ALLERGIES:  is allergic to aspirin, ubrogepant, and venlafaxine.  MEDICATIONS:  Current Outpatient Medications  Medication Sig Dispense Refill   acetaminophen (TYLENOL) 500 MG tablet Take 500 mg by mouth every 6 (six) hours as needed.     atorvastatin (LIPITOR) 80 MG tablet Take 1 tablet (80 mg total) by mouth daily. 30 tablet 1   cholecalciferol (VITAMIN D3) 25 MCG (1000 UNIT) tablet Take 1,000 Units by mouth daily.     Cyanocobalamin (B-12 PO) Take 1 tablet by mouth daily.     famotidine (PEPCID) 20 MG tablet Take 20 mg by mouth daily.     fluticasone (FLONASE) 50 MCG/ACT nasal spray Place 1 spray into both nostrils daily as needed for allergies.     folic acid (FOLVITE) 1 MG tablet Take 1 mg by mouth daily.     Iron, Ferrous Sulfate, 325 (65 Fe) MG TABS 1 tablet     levocetirizine (XYZAL) 5 MG tablet Take 5 mg by mouth at bedtime as needed for allergies.     Magnesium 400 MG CAPS Take 400 mg by mouth every other day.     methotrexate (RHEUMATREX) 2.5 MG tablet Take 15 mg by mouth every Thursday.     olopatadine (PATANOL)  0.1 % ophthalmic solution Place 1 drop into both eyes daily as needed for allergies.     ondansetron (ZOFRAN ODT) 4 MG disintegrating tablet Take 1 tablet (4 mg total) by mouth every 8 (eight) hours as needed for nausea or vomiting. 20 tablet 6   pantoprazole (PROTONIX) 40 MG tablet Take 40 mg by mouth daily.     Polyethyl Glycol-Propyl Glycol (SYSTANE OP) Place 1 drop into both eyes as needed (for dry eyes).     predniSONE (DELTASONE) 5 MG tablet Take 5 mg by mouth 2 (two) times daily with a meal.     Rimegepant Sulfate (NURTEC) 75 MG TBDP Take 1 tab at onset of migraine.  May repeat in 2 hrs, if needed.   Max dose: 2 tabs/day. This is a 30 day prescription. 12 tablet 11   RITUXIMAB IV Inject into the vein.     sennosides-docusate sodium (SENOKOT-S) 8.6-50 MG tablet Take 1 tablet by mouth daily.     triamcinolone cream (KENALOG) 0.1 % Apply 1 application topically as needed (for skin irritation).     No current facility-administered medications for this visit.    SURGICAL HISTORY:  Past Surgical History:  Procedure Laterality Date   ABDOMINAL HYSTERECTOMY     BRONCHIAL BIOPSY  05/24/2021   Procedure: BRONCHIAL BIOPSIES;  Surgeon: Josephine Igo, DO;  Location: [facility];  Service: Pulmonary;;   BRONCHIAL BRUSHINGS  05/24/2021   Procedure: BRONCHIAL BRUSHINGS;  Surgeon: Josephine Igo, DO;  Location: [facility];  Service: Pulmonary;;   BRONCHIAL NEEDLE ASPIRATION BIOPSY  05/24/2021   Procedure: BRONCHIAL NEEDLE ASPIRATION BIOPSIES;  Surgeon: Josephine Igo, DO;  Location: [facility];  Service: Pulmonary;;   BUNIONECTOMY     both   FIDUCIAL MARKER PLACEMENT  05/24/2021   Procedure: FIDUCIAL DYE MARKING;  Surgeon: Josephine Igo, DO;  Location: [facility];  Service: Pulmonary;;   FINGER ARTHROPLASTY Right 07/30/2012   Procedure: RIGHT INDEX FINGER, MIDDLE FINGER, RING FINGER, SMALL FINGER MCP ARTHROPLASTY WITH REALIGNMENT/REPAIR OF EXTENSOR TENDON;  Surgeon: Dominica Severin, MD;  Location: [facility];  Service: Orthopedics;  Laterality: Right;   FINGER SURGERY     HAMMER TOE SURGERY Left 03/03/2014   Procedure: SECOND THROUGH FIFTH HAMMERTOE CORRECTION;  Surgeon: Toni Arthurs, MD;  Location: [facility];  Service: Orthopedics;  Laterality: Left;   INTERCOSTAL NERVE BLOCK Left 05/24/2021   Procedure: INTERCOSTAL NERVE BLOCK;  Surgeon: Corliss Skains, MD;  Location: [facility];  Service: Thoracic;  Laterality: Left;   JOINT REPLACEMENT     LYMPH NODE DISSECTION Left 05/24/2021   Procedure: LYMPH NODE DISSECTION;  Surgeon: Corliss Skains, MD;  Location: [facility];  Service:  Thoracic;  Laterality: Left;   METATARSAL HEAD EXCISION Left 03/03/2014   Procedure: LEFT SECOND THROUGH FIFTH METATARSAL HEAD EXCISION;  Surgeon: Toni Arthurs, MD;  Location: [facility];  Service: Orthopedics;  Laterality: Left;   OOPHORECTOMY     VIDEO BRONCHOSCOPY Bilateral 12/18/2016   Procedure: VIDEO BRONCHOSCOPY WITH FLUORO;  Surgeon: Nyoka Cowden, MD;  Location: [facility];  Service: Cardiopulmonary;  Laterality: Bilateral;   VIDEO BRONCHOSCOPY WITH ENDOBRONCHIAL NAVIGATION Left 02/26/2017   Procedure: VIDEO BRONCHOSCOPY WITH ENDOBRONCHIAL NAVIGATION;  Surgeon: Leslye Peer, MD;  Location: [facility];  Service: Thoracic;  Laterality: Left;   VIDEO BRONCHOSCOPY WITH RADIAL ENDOBRONCHIAL ULTRASOUND  05/24/2021   Procedure: VIDEO BRONCHOSCOPY WITH RADIAL ENDOBRONCHIAL ULTRASOUND;  Surgeon: Josephine Igo, DO;  Location: [facility];  Service: Pulmonary;;  REVIEW OF SYSTEMS:  Constitutional: negative Eyes: negative Ears, nose, mouth, throat, and face: negative Respiratory: negative Cardiovascular: negative Gastrointestinal: negative Genitourinary:negative Integument/breast: negative Hematologic/lymphatic: negative Musculoskeletal:negative Neurological: negative Behavioral/Psych: negative Endocrine: negative Allergic/Immunologic: negative   PHYSICAL EXAMINATION: General appearance: alert, cooperative, and no distress Head: Normocephalic, without obvious abnormality, atraumatic Neck: no adenopathy, no JVD, supple, symmetrical, trachea midline, and thyroid not enlarged, symmetric, no tenderness/mass/nodules Lymph nodes: Cervical, supraclavicular, and axillary nodes normal. Resp: clear to auscultation bilaterally Back: symmetric, no curvature. ROM normal. No CVA tenderness. Cardio: regular rate and rhythm, S1, S2 normal, no murmur, click, rub or gallop GI: soft, non-tender; bowel sounds normal; no masses,  no organomegaly Extremities: extremities normal,  atraumatic, no cyanosis or edema Neurologic: Alert and oriented X 3, normal strength and tone. Normal symmetric reflexes. Normal coordination and gait  ECOG PERFORMANCE STATUS: 0 - Asymptomatic  Blood pressure (!) 144/90, pulse 89, temperature 98 F (36.7 C), temperature source Temporal, resp. rate 17, weight 116 lb (52.6 kg), SpO2 99%.  LABORATORY DATA: Lab Results  Component Value Date   WBC 9.9 06/26/2023   HGB 13.9 06/26/2023   HCT 42.8 06/26/2023   MCV 86.8 06/26/2023   PLT 221 06/26/2023      Chemistry      Component Value Date/Time   NA 144 06/26/2023 0927   NA 141 08/29/2020 1511   K 3.9 06/26/2023 0927   CL 103 06/26/2023 0927   CO2 36 (H) 06/26/2023 0927   BUN 18 06/26/2023 0927   BUN 24 08/29/2020 1511   CREATININE 0.73 06/26/2023 0927      Component Value Date/Time   CALCIUM 9.2 06/26/2023 0927   ALKPHOS 70 06/26/2023 0927   AST 23 06/26/2023 0927   ALT 16 06/26/2023 0927   BILITOT 0.9 06/26/2023 0927       RADIOGRAPHIC STUDIES: CT Chest W Contrast Result Date: 06/28/2023 CLINICAL DATA:  Restaging non-small cell lung cancer. History of left lower lobe lobectomy. * Tracking Code: BO * EXAM: CT CHEST WITH CONTRAST TECHNIQUE: Multidetector CT imaging of the chest was performed during intravenous contrast administration. RADIATION DOSE REDUCTION: This exam was performed according to the departmental dose-optimization program which includes automated exposure control, adjustment of the mA and/or kV according to patient size and/or use of iterative reconstruction technique. CONTRAST:  75mL OMNIPAQUE IOHEXOL 300 MG/ML  SOLN COMPARISON:  Chest CT 06/25/2022 FINDINGS: Cardiovascular: The heart is normal in size. No pericardial effusion. The aorta is normal in caliber. No dissection. Scattered atherosclerotic calcification. Calcifications noted around the aortic valve. Mediastinum/Nodes: No mediastinal or hilar mass or lymphadenopathy. The esophagus is unremarkable. The  thyroid gland is normal. Lungs/Pleura: Stable surgical changes from a left lower lobe lobectomy. No findings suspicious for residual or recurrent tumor. No metastatic pulmonary nodules or new pulmonary lesions. The central tracheobronchial tree is unremarkable. Persistent small left pleural effusion and minimal overlying scarring changes at the left lung base. Upper Abdomen: No significant upper abdominal findings. No hepatic or adrenal gland lesions. Stable upper pole hyperdense/hemorrhagic cyst involving the left kidney not requiring any further imaging evaluation or follow-up. 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 Musculoskeletal: No significant bony findings. IMPRESSION: 1. Stable surgical changes from a left lower lobe lobectomy. No findings suspicious for residual or recurrent tumor. 2. No mediastinal or hilar mass or lymphadenopathy. 3. Persistent small left pleural effusion and minimal overlying scarring changes at the left lung base. 4. 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 5. Aortic  atherosclerosis. Electronically Signed   By: Rudie Meyer M.D.   On: 06/28/2023 15:05    ASSESSMENT AND PLAN: This is a very pleasant 78 years old white female with history of stage IA (T1c, N0, M0) non-small cell lung cancer, adenocarcinoma status post left lower lobectomy with lymph node sampling under the care of Dr. Cliffton Asters on May 24, 2021.   The patient is currently on observation and she is feeling fine. She had repeat CT scan of the chest performed recently.  I personally independently reviewed the scan and discussed the result with the patient and her spouse. Her scan showed no concerning findings for disease recurrence or metastasis.    Stage 1A Non-Small Cell Lung Cancer (NSCLC) - Adenocarcinoma Diagnosed in January 2023. Underwent left lower lobectomy by Dr. Cliffton Asters. No adjuvant chemotherapy or radiation required. Recent scan shows no evidence of  residual or recurrent tumor, no mediastinal, hilar, or other lymphadenopathy. Asymptomatic with no chest pain, dyspnea, or cough. Excellent prognosis given early stage and complete resection. - Schedule follow-up scan in one year - Perform scan one week prior to next visit  Duodenal Mass with Atypical Cells Underwent resection and stomach reconfiguration. Recent endoscopy shows satisfactory results with no new issues. Discussed potential for atypical cells to convert to cancer and importance of ongoing monitoring. - Continue regular follow-up with gastroenterologist  Gastroesophageal Reflux Disease (GERD) Post-surgical requirement for lifelong acid suppression therapy. Currently on Protonix (pantoprazole) and Pepcid (famotidine). Advised to continue only Protonix once daily due to its 24-hour efficacy. Discussed benefits of Protonix over Pepcid and need for lifelong management to prevent complications. - Continue Protonix (pantoprazole) once daily - Discontinue Pepcid (famotidine) - Follow up with family doctor or gastroenterologist for prescription management  Arthritis Transitioned from biologics to rituximab. - Continue current arthritis management with rituximab  Follow-up - Schedule follow-up visit in one year - Perform scan one week prior to next visit.   She was advised to call immediately if she has any other concerning symptoms in the interval. The patient voices understanding of current disease status and treatment options and is in agreement with the current care plan.  All questions were answered. The patient knows to call the clinic with any problems, questions or concerns. We can certainly see the patient much sooner if necessary.  The total time spent in the appointment was 30 minutes.  Disclaimer: This note was dictated with voice recognition software. Similar sounding words can inadvertently be transcribed and may not be corrected upon review.

## 2023-07-14 ENCOUNTER — Ambulatory Visit: Payer: Self-pay | Admitting: Licensed Clinical Social Worker

## 2023-07-14 NOTE — Patient Instructions (Signed)
 Visit Information  Thank you for taking time to visit with me today. Please don't hesitate to contact me if I can be of assistance to you.   Following are the goals we discussed today:   Goals Addressed             This Visit's Progress    COMPLETED: Health and Mental Health Wellness       Activities and task to complete in order to accomplish goals.    Attend next therapeutic counseling session and consider connecting with volunteering at [employer] as mentioned as an interest- reach out to them for inquiry/visit Call Customer Service on your insurance cards to check benefits (copay) for outpatient mental health counseling as well as to request in-network providers' list if needed Self Support options  (seek positive thoughts and activities- focus on the positive/positive self-talk, consider counseling (for depression, hoarding, etc as well as grief counseling, set personal goals for achieving task to clean desk ex: committing to 5 minutes per day of time spent ) Consider setting a goal as discussed for purging things not needed Consider completion (sign in front of witnesses and Notary) of Advance Directive packet  Glad you have been traveling, volunteering, etc   Continue seeing therapist in [facility], working on procrastination, caregiver burnout and wanting to get out more Speak to family about coming to her for birthday party Review caregiver support groups sent by e-mail Review behavioral activation resources sent by e-mail. Continue monitoring mood and utilizing coping skills as needed           Please call the care guide team at [phone] if you need to cancel or reschedule your appointment.   If you are experiencing a Mental Health or Behavioral Health Crisis or need someone to talk to, please call the Suicide and Crisis Lifeline: 988  Patient verbalizes understanding of instructions and care plan provided today and agrees to view in MyChart. Active MyChart  status and patient understanding of how to access instructions and care plan via MyChart confirmed with patient.     No further follow up required: Please contact your provider if you would like to reconnect.

## 2023-07-14 NOTE — Patient Outreach (Signed)
 Care Coordination   Follow Up Visit Note   07/14/2023 Name: Kendra Taylor MRN: [id number] DOB: 1946-02-09  Kendra Taylor is a 78 y.o. year old female who sees Merri Brunette, MD for primary care. I spoke with  Kristopher Glee Cedrone by phone today.  What matters to the patients health and wellness today?  Better managing my my mental health symptoms.    Goals Addressed             This Visit's Progress    COMPLETED: Health and Mental Health Wellness       Activities and task to complete in order to accomplish goals.    Attend next therapeutic counseling session and consider connecting with volunteering at [employer] as mentioned as an interest- reach out to them for inquiry/visit Call Customer Service on your insurance cards to check benefits (copay) for outpatient mental health counseling as well as to request in-network providers' list if needed Self Support options  (seek positive thoughts and activities- focus on the positive/positive self-talk, consider counseling (for depression, hoarding, etc as well as grief counseling, set personal goals for achieving task to clean desk ex: committing to 5 minutes per day of time spent ) Consider setting a goal as discussed for purging things not needed Consider completion (sign in front of witnesses and Notary) of Advance Directive packet  Glad you have been traveling, volunteering, etc   Continue seeing therapist in [facility], working on procrastination, caregiver burnout and wanting to get out more Speak to family about coming to her for birthday party Review caregiver support groups sent by e-mail Review behavioral activation resources sent by e-mail. Continue monitoring mood and utilizing coping skills as needed          SDOH assessments and interventions completed:  Yes     Care Coordination Interventions:  Yes, provided   Interventions Today    Flowsheet Row Most Recent Value  Chronic Disease    Chronic disease during today's visit Other  [Depression]  General Interventions   General Interventions Discussed/Reviewed [employer], General Interventions Discussed  [Pt was interested in attending support groups for spouses of dementia, CSW agreed to send list of support groups by e-mail. Pt is also interested in hiring cleaners as a lot of her stress is related to how lean her house is - pt agreed to reach out.]  Mental Health Interventions   Mental Health Discussed/Reviewed Mental Health Discussed, Mental Health Reviewed, Coping Strategies, Depression  [Pt reported she has not had a therapy appt for about a month, pt plans on scheduling one soon. We reviewed benefits of ongoing therapy and pt agreed. We reviewed pt's stressors and her short term goals, also reviewed her coping skills/gratitude.]        Follow up plan: No further intervention required.   Encounter Outcome:  Patient Visit Completed    Kenton Kingfisher, LCSW [facility], [facility] Licensed Clinical Social Worker Care Coordinator [phone]

## 2023-07-24 DIAGNOSIS — M0589 Other rheumatoid arthritis with rheumatoid factor of multiple sites: Secondary | ICD-10-CM | POA: Diagnosis not present

## 2023-08-08 ENCOUNTER — Encounter (HOSPITAL_COMMUNITY): Payer: Self-pay

## 2023-08-08 ENCOUNTER — Ambulatory Visit (HOSPITAL_COMMUNITY)
Admission: RE | Admit: 2023-08-08 | Discharge: 2023-08-08 | Disposition: A | Discharge status: Home / Self Care | Payer: MEDICARE | Source: Ambulatory Visit | Attending: Family Medicine | Admitting: Family Medicine

## 2023-08-08 VITALS — BP 148/79 | HR 87 | Temp 98.1°F | Resp 16 | Ht 62.5 in | Wt 116.0 lb

## 2023-08-08 DIAGNOSIS — S51812A Laceration without foreign body of left forearm, initial encounter: Secondary | ICD-10-CM

## 2023-08-08 MED ORDER — CEPHALEXIN 500 MG PO CAPS: 500.0000 mg | ORAL_CAPSULE | Freq: Two times a day (BID) | ORAL | 0 refills | Status: AC

## 2023-08-08 NOTE — ED Provider Notes (Signed)
 [facility]  Note:  This document was prepared using Dragon voice recognition software and may include unintentional dictation errors.  MRN: [id number] DOB: 1945-12-28  Subjective:   Kendra Taylor is a 78 y.o. female presenting for evaluation of left forearm skin tear that occurred 2 weeks ago.  Patient is concern for infection due to increased redness and mild drainage.  Patient reports that she has kept wound covered since the occurrence, has not used any antibiotic ointment over wound.  Patient did leave torn skin in place.  No current bleeding, warmth, thick purulent discharge, or increased pain.  Patient denies any other medical concern at this time.  No current facility-administered medications for this encounter.  Current Outpatient Medications:    cephALEXin (KEFLEX) 500 MG capsule, Take 1 capsule (500 mg total) by mouth 2 (two) times daily for 5 days., Disp: 10 capsule, Rfl: 0   acetaminophen (TYLENOL) 500 MG tablet, Take 500 mg by mouth every 6 (six) hours as needed., Disp: , Rfl:    atorvastatin (LIPITOR) 80 MG tablet, Take 1 tablet (80 mg total) by mouth daily., Disp: 30 tablet, Rfl: 1   cholecalciferol (VITAMIN D3) 25 MCG (1000 UNIT) tablet, Take 1,000 Units by mouth daily., Disp: , Rfl:    Cyanocobalamin (B-12 PO), Take 1 tablet by mouth daily., Disp: , Rfl:    famotidine (PEPCID) 20 MG tablet, Take 20 mg by mouth daily., Disp: , Rfl:    fluticasone (FLONASE) 50 MCG/ACT nasal spray, Place 1 spray into both nostrils daily as needed for allergies., Disp: , Rfl:    folic acid (FOLVITE) 1 MG tablet, Take 1 mg by mouth daily., Disp: , Rfl:    Iron, Ferrous Sulfate, 325 (65 Fe) MG TABS, 1 tablet, Disp: , Rfl:    levocetirizine (XYZAL) 5 MG tablet, Take 5 mg by mouth at bedtime as needed for allergies., Disp: , Rfl:    olopatadine (PATANOL) 0.1 % ophthalmic solution, Place 1 drop into both eyes daily as needed for allergies., Disp: , Rfl:    ondansetron (ZOFRAN  ODT) 4 MG disintegrating tablet, Take 1 tablet (4 mg total) by mouth every 8 (eight) hours as needed for nausea or vomiting., Disp: 20 tablet, Rfl: 6   pantoprazole (PROTONIX) 40 MG tablet, Take 40 mg by mouth daily. (Patient not taking: Reported on 08/08/2023), Disp: , Rfl:    Polyethyl Glycol-Propyl Glycol (SYSTANE OP), Place 1 drop into both eyes as needed (for dry eyes)., Disp: , Rfl:    predniSONE (DELTASONE) 5 MG tablet, Take 5 mg by mouth 2 (two) times daily with a meal., Disp: , Rfl:    RITUXIMAB IV, Inject into the vein., Disp: , Rfl:    sennosides-docusate sodium (SENOKOT-S) 8.6-50 MG tablet, Take 1 tablet by mouth daily., Disp: , Rfl:    triamcinolone cream (KENALOG) 0.1 %, Apply 1 application topically as needed (for skin irritation)., Disp: , Rfl:    Allergies  Allergen Reactions   Aspirin Other (See Comments)    Ringing in ears with large doses, large doses causes her to lose hearing   Ubrogepant Nausea And Vomiting   Venlafaxine     Headaches    Past Medical History:  Diagnosis Date   Anemia    Anxiety    Arthritis    Atypical mole 01/08/2013   right inner forearm   Basal cell carcinoma 03/12/2017   right concha exc mohs   Depression    Dyspnea    GERD (gastroesophageal reflux disease)  HA (headache)    Migraine    Sleep apnea    cpap     Past Surgical History:  Procedure Laterality Date   ABDOMINAL HYSTERECTOMY     BRONCHIAL BIOPSY  05/24/2021   Procedure: BRONCHIAL BIOPSIES;  Surgeon: Josephine Igo, DO;  Location: [facility];  Service: Pulmonary;;   BRONCHIAL BRUSHINGS  05/24/2021   Procedure: BRONCHIAL BRUSHINGS;  Surgeon: Josephine Igo, DO;  Location: [facility];  Service: Pulmonary;;   BRONCHIAL NEEDLE ASPIRATION BIOPSY  05/24/2021   Procedure: BRONCHIAL NEEDLE ASPIRATION BIOPSIES;  Surgeon: Josephine Igo, DO;  [facility]: [facility];  Service: Pulmonary;;   BUNIONECTOMY     both   FIDUCIAL MARKER PLACEMENT  05/24/2021   Procedure: FIDUCIAL  DYE MARKING;  Surgeon: Josephine Igo, DO;  Location: [facility];  Service: Pulmonary;;   FINGER ARTHROPLASTY Right 07/30/2012   Procedure: RIGHT INDEX FINGER, MIDDLE FINGER, RING FINGER, SMALL FINGER MCP ARTHROPLASTY WITH REALIGNMENT/REPAIR OF EXTENSOR TENDON;  Surgeon: Dominica Severin, MD;  Location: [facility];  Service: Orthopedics;  Laterality: Right;   FINGER SURGERY     HAMMER TOE SURGERY Left 03/03/2014   Procedure: SECOND THROUGH FIFTH HAMMERTOE CORRECTION;  Surgeon: Toni Arthurs, MD;  Location: [facility];  Service: Orthopedics;  Laterality: Left;   INTERCOSTAL NERVE BLOCK Left 05/24/2021   Procedure: INTERCOSTAL NERVE BLOCK;  Surgeon: Corliss Skains, MD;  Location: [facility];  Service: Thoracic;  Laterality: Left;   JOINT REPLACEMENT     LYMPH NODE DISSECTION Left 05/24/2021   Procedure: LYMPH NODE DISSECTION;  Surgeon: Corliss Skains, MD;  [facility];  Service: Thoracic;  Laterality: Left;   METATARSAL HEAD EXCISION Left 03/03/2014   Procedure: LEFT SECOND THROUGH FIFTH METATARSAL HEAD EXCISION;  Surgeon: Toni Arthurs, MD;  Location: [facility];  Service: Orthopedics;  Laterality: Left;   OOPHORECTOMY     VIDEO BRONCHOSCOPY Bilateral 12/18/2016   Procedure: VIDEO BRONCHOSCOPY WITH FLUORO;  Surgeon: Nyoka Cowden, MD;  Location: [facility];  Service: Cardiopulmonary;  Laterality: Bilateral;   VIDEO BRONCHOSCOPY WITH ENDOBRONCHIAL NAVIGATION Left 02/26/2017   Procedure: VIDEO BRONCHOSCOPY WITH ENDOBRONCHIAL NAVIGATION;  Surgeon: Leslye Peer, MD;  [facility];  Service: Thoracic;  Laterality: Left;   VIDEO BRONCHOSCOPY WITH RADIAL ENDOBRONCHIAL ULTRASOUND  05/24/2021   Procedure: VIDEO BRONCHOSCOPY WITH RADIAL ENDOBRONCHIAL ULTRASOUND;  Surgeon: Josephine Igo, DO;  [facility];  Service: Pulmonary;;    Family History  Problem Relation Age of Onset   Dementia Mother    Sleep apnea Mother    Hyperthyroidism Mother    Dementia  Father    Sleep apnea Father     Social History   Tobacco Use   Smoking status: Never   Smokeless tobacco: Never  Substance Use Topics   Alcohol use: No   Drug use: No    ROS Refer to HPI for ROS details.  Objective:   Vitals: BP (!) 148/79 (BP Location: Left Arm)   Pulse 87   Temp 98.1 F (36.7 C) (Oral)   Resp 16   Ht 5' 2.5" (1.588 m)   Wt 116 lb (52.6 kg)   SpO2 96%   BMI 20.88 kg/m   Physical Exam Vitals and nursing note reviewed.  Constitutional:      General: She is not in acute distress.    Appearance: Normal appearance. She is well-developed. She is not ill-appearing or toxic-appearing.  HENT:     Head: Normocephalic.  Cardiovascular:  Rate and Rhythm: Normal rate.  Pulmonary:     Effort: Pulmonary effort is normal. No respiratory distress.  Skin:    General: Skin is warm and dry.     Capillary Refill: Capillary refill takes less than 2 seconds.     Findings: Erythema and wound (Left forearm skin tear, mild erythema, mild swelling, no significant purulent discharge, no bleeding, mild discomfort with palpation.) present. No abscess or ecchymosis.  Neurological:     General: No focal deficit present.     Mental Status: She is alert and oriented to person, place, and time.  Psychiatric:        Mood and Affect: Mood normal.     Procedures  No results found for this or any previous visit (from the past 24 hours).  Assessment and Plan :   1. Skin tear of forearm without complication, left, initial encounter (Primary) - cephALEXin (KEFLEX) 500 MG capsule; Take 1 capsule (500 mg total) by mouth 2 (two) times daily for 5 days.  Dispense: 10 capsule; Refill: 0 -Apply antibiotic ointment or Vaseline to external surface of the skin over wound for protection and lubrication until fully healed. -Continue to monitor for worsening signs of infection such as increased purulent discharge, increased redness, warmth, pain, fever. -Continue to monitor symptoms  for any change in severity if there is any escalation of current symptoms or development of new symptoms follow-up in ER for further evaluation and management.  Lucky Cowboy   Pierson, Starbrick B, [place] 08/08/23 1109

## 2023-08-08 NOTE — Discharge Instructions (Addendum)
 1. Skin tear of forearm without complication, left, initial encounter (Primary) - cephALEXin (KEFLEX) 500 MG capsule; Take 1 capsule (500 mg total) by mouth 2 (two) times daily for 5 days.  Dispense: 10 capsule; Refill: 0 -Apply antibiotic ointment or Vaseline to external surface of the skin over wound for protection and lubrication until fully healed. -Continue to monitor for worsening signs of infection such as increased purulent discharge, increased redness, warmth, pain, fever.

## 2023-08-08 NOTE — ED Triage Notes (Signed)
 Patient here today to have a skin tear on her left forearm looked at that happened 2 weeks ago. Patient states that the area is tender and puffy.

## 2023-08-26 DIAGNOSIS — M0589 Other rheumatoid arthritis with rheumatoid factor of multiple sites: Secondary | ICD-10-CM | POA: Diagnosis not present

## 2023-10-16 ENCOUNTER — Encounter (HOSPITAL_COMMUNITY): Payer: Self-pay | Admitting: Emergency Medicine

## 2023-10-16 ENCOUNTER — Ambulatory Visit (HOSPITAL_COMMUNITY)
Admission: EM | Admit: 2023-10-16 | Discharge: 2023-10-16 | Disposition: A | Discharge status: Home / Self Care | Payer: MEDICARE

## 2023-10-16 DIAGNOSIS — S51812A Laceration without foreign body of left forearm, initial encounter: Secondary | ICD-10-CM

## 2023-10-16 NOTE — ED Provider Notes (Signed)
 [facility]    CSN: [id number] Arrival date & time: 10/16/23  4098     History   Chief Complaint Chief Complaint  Patient presents with   Arm Injury    HPI Kendra Taylor is a 78 y.o. female.  Skin tear on the left forearm occurred this morning  Scraped on the door frame Minimal bleeding. No wound care performed yet Tdap updated in 2023  Past Medical History:  Diagnosis Date   Anemia    Anxiety    Arthritis    Atypical mole 01/08/2013   right inner forearm   Basal cell carcinoma 03/12/2017   right concha exc mohs   Depression    Dyspnea    GERD (gastroesophageal reflux disease)    HA (headache)    Migraine    Sleep apnea    cpap    Patient Active Problem List   Diagnosis Date Noted   Allergic rhinitis 07/11/2021   Breast tenderness 07/11/2021   Elevated blood-pressure reading without diagnosis of hypertension 07/11/2021   Female stress incontinence 07/11/2021   Hearing loss 07/11/2021   Iron deficiency anemia 07/11/2021   Leukocytosis 07/11/2021   Lung field abnormal 07/11/2021   Malignant neoplasm of unspecified part of left bronchus or lung (HCC) 07/11/2021   Migraine without status migrainosus, not intractable 07/11/2021   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   Personal history of transient ischemic attack (TIA), and cerebral infarction without residual deficits 07/11/2021   Weight decreased 07/11/2021   Adenocarcinoma of left lung, 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   S/P Robotic Asissted Video Assisted Thoracoscopy with Left Lower Lobectomy 05/24/2021   Vision changes 01/08/2021   Chronic migraine w/o aura w/o status migrainosus, not intractable 01/08/2021   Stroke-like symptoms 12/26/2020   Esophageal reflux 12/26/2020   Anxiety 12/26/2020   Obstructive sleep apnea 12/26/2020   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   Precordial pain    Nasal congestion 05/20/2017   Solitary pulmonary nodule 12/18/2016   LPRD  (laryngopharyngeal reflux disease) 02/26/2016   Cough 08/23/2015   Upper airway cough syndrome with classic voice fatigue >> doe  08/23/2015   Bronchiectasis without acute exacerbation (HCC) 08/23/2015   HA (headache)    Rheumatoid arthritis (HCC) 12/27/2013    Past Surgical History:  Procedure Laterality Date   ABDOMINAL HYSTERECTOMY     BRONCHIAL BIOPSY  05/24/2021   Procedure: BRONCHIAL BIOPSIES;  Surgeon: Prudy Brownie, DO;  [facility];  Service: Pulmonary;;   BRONCHIAL BRUSHINGS  05/24/2021   Procedure: BRONCHIAL BRUSHINGS;  Surgeon: Prudy Brownie, DO;  [facility];  Service: Pulmonary;;   BRONCHIAL NEEDLE ASPIRATION BIOPSY  05/24/2021   Procedure: BRONCHIAL NEEDLE ASPIRATION BIOPSIES;  Surgeon: Prudy Brownie, DO;  [facility];  Service: Pulmonary;;   BUNIONECTOMY     both   FIDUCIAL MARKER PLACEMENT  05/24/2021   Procedure: FIDUCIAL DYE MARKING;  Surgeon: Prudy Brownie, DO;  [facility];  Service: Pulmonary;;   FINGER ARTHROPLASTY Right 07/30/2012   Procedure: RIGHT INDEX FINGER, MIDDLE FINGER, RING FINGER, SMALL FINGER MCP ARTHROPLASTY WITH REALIGNMENT/REPAIR OF EXTENSOR TENDON;  Surgeon: Ronn Cohn, MD;  [facility];  Service: Orthopedics;  Laterality: Right;   FINGER SURGERY     HAMMER TOE SURGERY Left 03/03/2014   Procedure: SECOND THROUGH FIFTH HAMMERTOE CORRECTION;  Surgeon: Amada Backer, MD;  Location: [facility];  Service: Orthopedics;  Laterality: Left;   INTERCOSTAL NERVE BLOCK Left 05/24/2021   Procedure: INTERCOSTAL  NERVE BLOCK;  Surgeon: Hilarie Lovely, MD;  Location: [facility];  Service: Thoracic;  Laterality: Left;   JOINT REPLACEMENT     LYMPH NODE DISSECTION Left 05/24/2021   Procedure: LYMPH NODE DISSECTION;  Surgeon: Hilarie Lovely, MD;  [facility];  Service: Thoracic;  Laterality: Left;   METATARSAL HEAD EXCISION Left 03/03/2014   Procedure: LEFT SECOND THROUGH FIFTH METATARSAL  HEAD EXCISION;  Surgeon: Amada Backer, MD;  Location: [facility];  Service: Orthopedics;  Laterality: Left;   OOPHORECTOMY     VIDEO BRONCHOSCOPY Bilateral 12/18/2016   Procedure: VIDEO BRONCHOSCOPY WITH FLUORO;  Surgeon: Diamond Formica, MD;  Location: [facility];  Service: Cardiopulmonary;  Laterality: Bilateral;   VIDEO BRONCHOSCOPY WITH ENDOBRONCHIAL NAVIGATION Left 02/26/2017   Procedure: VIDEO BRONCHOSCOPY WITH ENDOBRONCHIAL NAVIGATION;  Surgeon: Denson Flake, MD;  [facility];  Service: Thoracic;  Laterality: Left;   VIDEO BRONCHOSCOPY WITH RADIAL ENDOBRONCHIAL ULTRASOUND  05/24/2021   Procedure: VIDEO BRONCHOSCOPY WITH RADIAL ENDOBRONCHIAL ULTRASOUND;  Surgeon: Prudy Brownie, DO;  [facility];  Service: Pulmonary;;    OB History   No obstetric history on file.      Home Medications    Prior to Admission medications   Medication Sig Start Date End Date Taking? Authorizing Provider  acetaminophen  (TYLENOL ) 500 MG tablet Take 500 mg by mouth every 6 (six) hours as needed.    [provider]  atorvastatin  (LIPITOR ) 80 MG tablet Take 1 tablet (80 mg total) by mouth daily. 12/28/20   Gherghe, Costin M, MD  cholecalciferol  (VITAMIN D3) 25 MCG (1000 UNIT) tablet Take 1,000 Units by mouth daily.    [provider]  Cyanocobalamin  (B-12 PO) Take 1 tablet by mouth daily.    [provider]  famotidine (PEPCID) 20 MG tablet Take 20 mg by mouth daily. 06/11/22   [provider]  fluticasone  (FLONASE ) 50 MCG/ACT nasal spray Place 1 spray into both nostrils daily as needed for allergies. 08/28/20   [provider]  folic acid  (FOLVITE ) 1 MG tablet Take 1 mg by mouth daily.    [provider]  Iron, Ferrous Sulfate, 325 (65 Fe) MG TABS 1 tablet 06/05/21   [provider]  levocetirizine (XYZAL) 5 MG tablet Take 5 mg by mouth at bedtime as needed for allergies.    [provider]  olopatadine   (PATANOL) 0.1 % ophthalmic solution Place 1 drop into both eyes daily as needed for allergies.    [provider]  ondansetron  (ZOFRAN  ODT) 4 MG disintegrating tablet Take 1 tablet (4 mg total) by mouth every 8 (eight) hours as needed for nausea or vomiting. 01/08/21   Phebe Brasil, MD  pantoprazole  (PROTONIX ) 40 MG tablet Take 40 mg by mouth daily. Patient not taking: Reported on 08/08/2023    [provider]  Polyethyl Glycol-Propyl Glycol (SYSTANE OP) Place 1 drop into both eyes as needed (for dry eyes).    [provider]  predniSONE (DELTASONE) 5 MG tablet Take 5 mg by mouth 2 (two) times daily with a meal.    [provider]  RITUXIMAB  IV Inject into the vein.    [provider]  sennosides-docusate sodium  (SENOKOT-S) 8.6-50 MG tablet Take 1 tablet by mouth daily.    [provider]  triamcinolone cream (KENALOG) 0.1 % Apply 1 application topically as needed (for skin irritation).    [provider]    Family History Family History  Problem Relation Age of  Onset   Dementia Mother    Sleep apnea Mother    Hyperthyroidism Mother    Dementia Father    Sleep apnea Father     Social History Social History   Tobacco Use   Smoking status: Never   Smokeless tobacco: Never  Substance Use Topics   Alcohol  use: No   Drug use: No     Allergies   Aspirin , Ubrogepant, and Venlafaxine   Review of Systems Review of Systems As per HPI  Physical Exam Triage Vital Signs ED Triage Vitals  Encounter Vitals Group     BP 10/16/23 0841 135/89     Girls Systolic BP Percentile --      Girls Diastolic BP Percentile --      Boys Systolic BP Percentile --      Boys Diastolic BP Percentile --      Pulse Rate 10/16/23 0841 69     Resp 10/16/23 0841 17     Temp 10/16/23 0841 97.9 F (36.6 C)     Temp Source 10/16/23 0841 Oral     SpO2 10/16/23 0841 96 %     Weight --      Height --      Head Circumference --      Peak Flow --       Pain Score 10/16/23 0840 2     Pain Loc --      Pain Education --      Exclude from Growth Chart --    No data found.  Updated Vital Signs BP 135/89 (BP Location: Right Arm)   Pulse 69   Temp 97.9 F (36.6 C) (Oral)   Resp 17   SpO2 96%    Physical Exam Vitals and nursing note reviewed.  Constitutional:      General: She is not in acute distress.    Appearance: Normal appearance.  HENT:     Mouth/Throat:     Pharynx: Oropharynx is clear.   Cardiovascular:     Rate and Rhythm: Normal rate and regular rhythm.     Pulses: Normal pulses.     Heart sounds: Normal heart sounds.  Pulmonary:     Effort: Pulmonary effort is normal.     Breath sounds: Normal breath sounds.   Skin:    Findings: Signs of injury present.     Comments: Two small skin tears on the left forearm. About 0.5 cm and 1 cm diameter. Not bleeding. No pain with palpation. Normal ROM of wrist and elbow. Radial pulse 2+   Neurological:     Mental Status: She is alert and oriented to person, place, and time.     UC Treatments / Results  Labs (all labs ordered are listed, but only abnormal results are displayed) Labs Reviewed - No data to display  EKG  Radiology No results found.  Procedures Procedures   Medications Ordered in UC Medications - No data to display  Initial Impression / Assessment and Plan / UC Course  I have reviewed the triage vital signs and the nursing notes.  Pertinent labs & imaging results that were available during my care of the patient were reviewed by me and considered in my medical decision making (see chart for details).  Skin tears are cleansed in clinic. Antibiotic ointment and nonstick dressing are applied.  Discussed wound care at home, signs of infection to monitor for.  Her tetanus vaccine was updated 2 years ago.  Reasons to return to clinic are discussed.  Patient agrees with plan, no questions  Final Clinical Impressions(s) / UC Diagnoses   Final  diagnoses:  Skin tear of left forearm without complication, initial encounter     Discharge Instructions      You can wash area normally with mild soap and water I recommend antibiotic ointment twice daily (bacitracin , neosporin, triple A, etc) Change dressing at least once daily. I recommend non-stick gauze!  Monitor for any signs of infection - increased pain, redness, swelling, or foul drainage. Please return if needed     ED Prescriptions   None    PDMP not reviewed this encounter.   Creighton Doffing, [place] 10/16/23 0981

## 2023-10-16 NOTE — Discharge Instructions (Signed)
 You can wash area normally with mild soap and water I recommend antibiotic ointment twice daily (bacitracin , neosporin, triple A, etc) Change dressing at least once daily. I recommend non-stick gauze!  Monitor for any signs of infection - increased pain, redness, swelling, or foul drainage. Please return if needed

## 2023-10-16 NOTE — ED Triage Notes (Signed)
 Pt reports this morning was trying to get the cat outside and scraped left arm on door frame causing two skin tears.

## 2023-10-30 DIAGNOSIS — E7800 Pure hypercholesterolemia, unspecified: Secondary | ICD-10-CM | POA: Diagnosis not present

## 2023-11-03 DIAGNOSIS — F419 Anxiety disorder, unspecified: Secondary | ICD-10-CM | POA: Diagnosis not present

## 2023-11-03 DIAGNOSIS — M0589 Other rheumatoid arthritis with rheumatoid factor of multiple sites: Secondary | ICD-10-CM | POA: Diagnosis not present

## 2023-11-03 DIAGNOSIS — D72829 Elevated white blood cell count, unspecified: Secondary | ICD-10-CM | POA: Diagnosis not present

## 2023-11-03 DIAGNOSIS — J309 Allergic rhinitis, unspecified: Secondary | ICD-10-CM | POA: Diagnosis not present

## 2023-11-03 DIAGNOSIS — K219 Gastro-esophageal reflux disease without esophagitis: Secondary | ICD-10-CM | POA: Diagnosis not present

## 2023-11-03 DIAGNOSIS — G43909 Migraine, unspecified, not intractable, without status migrainosus: Secondary | ICD-10-CM | POA: Diagnosis not present

## 2023-11-03 DIAGNOSIS — Z8673 Personal history of transient ischemic attack (TIA), and cerebral infarction without residual deficits: Secondary | ICD-10-CM | POA: Diagnosis not present

## 2023-11-03 DIAGNOSIS — Z903 Acquired absence of stomach [part of]: Secondary | ICD-10-CM | POA: Diagnosis not present

## 2023-11-03 DIAGNOSIS — F423 Hoarding disorder: Secondary | ICD-10-CM | POA: Diagnosis not present

## 2023-11-03 DIAGNOSIS — R8271 Bacteriuria: Secondary | ICD-10-CM | POA: Diagnosis not present

## 2023-11-03 DIAGNOSIS — Z0000 Encounter for general adult medical examination without abnormal findings: Secondary | ICD-10-CM | POA: Diagnosis not present

## 2023-12-01 DIAGNOSIS — M1990 Unspecified osteoarthritis, unspecified site: Secondary | ICD-10-CM | POA: Diagnosis not present

## 2023-12-01 DIAGNOSIS — Z79899 Other long term (current) drug therapy: Secondary | ICD-10-CM | POA: Diagnosis not present

## 2023-12-01 DIAGNOSIS — M069 Rheumatoid arthritis, unspecified: Secondary | ICD-10-CM | POA: Diagnosis not present

## 2023-12-01 DIAGNOSIS — M8580 Other specified disorders of bone density and structure, unspecified site: Secondary | ICD-10-CM | POA: Diagnosis not present

## 2023-12-09 DIAGNOSIS — M810 Age-related osteoporosis without current pathological fracture: Secondary | ICD-10-CM | POA: Diagnosis not present

## 2023-12-30 DIAGNOSIS — M0589 Other rheumatoid arthritis with rheumatoid factor of multiple sites: Secondary | ICD-10-CM | POA: Diagnosis not present

## 2024-01-14 DIAGNOSIS — M0589 Other rheumatoid arthritis with rheumatoid factor of multiple sites: Secondary | ICD-10-CM | POA: Diagnosis not present

## 2024-06-29 ENCOUNTER — Inpatient Hospital Stay: Payer: MEDICARE

## 2024-06-29 ENCOUNTER — Other Ambulatory Visit (HOSPITAL_COMMUNITY): Payer: MEDICARE

## 2024-06-30 ENCOUNTER — Other Ambulatory Visit: Payer: MEDICARE

## 2024-07-07 ENCOUNTER — Ambulatory Visit: Payer: MEDICARE | Admitting: Internal Medicine
